# Patient Record
Sex: Female | Born: 1980 | Race: White | Hispanic: No | Marital: Single | Smoking: Former smoker
Health system: Southern US, Community
[De-identification: ages and names within clinical notes are randomized; demographics above are authoritative.]

## PROBLEM LIST (undated history)

## (undated) ENCOUNTER — Inpatient Hospital Stay (HOSPITAL_COMMUNITY): Payer: Self-pay

## (undated) DIAGNOSIS — IMO0002 Reserved for concepts with insufficient information to code with codable children: Secondary | ICD-10-CM

## (undated) DIAGNOSIS — G4700 Insomnia, unspecified: Secondary | ICD-10-CM

## (undated) DIAGNOSIS — C801 Malignant (primary) neoplasm, unspecified: Secondary | ICD-10-CM

## (undated) DIAGNOSIS — D649 Anemia, unspecified: Secondary | ICD-10-CM

## (undated) DIAGNOSIS — N12 Tubulo-interstitial nephritis, not specified as acute or chronic: Secondary | ICD-10-CM

## (undated) DIAGNOSIS — N3010 Interstitial cystitis (chronic) without hematuria: Secondary | ICD-10-CM

## (undated) DIAGNOSIS — F419 Anxiety disorder, unspecified: Secondary | ICD-10-CM

## (undated) DIAGNOSIS — N200 Calculus of kidney: Secondary | ICD-10-CM

## (undated) DIAGNOSIS — Z9289 Personal history of other medical treatment: Secondary | ICD-10-CM

## (undated) DIAGNOSIS — N390 Urinary tract infection, site not specified: Secondary | ICD-10-CM

## (undated) HISTORY — PX: ABDOMINAL HYSTERECTOMY: SHX81

## (undated) HISTORY — DX: Interstitial cystitis (chronic) without hematuria: N30.10

## (undated) HISTORY — DX: Insomnia, unspecified: G47.00

## (undated) HISTORY — DX: Reserved for concepts with insufficient information to code with codable children: IMO0002

## (undated) HISTORY — PX: BREAST SURGERY: SHX581

## (undated) HISTORY — PX: DILATION AND CURETTAGE OF UTERUS: SHX78

## (undated) HISTORY — PX: WISDOM TOOTH EXTRACTION: SHX21

## (undated) HISTORY — DX: Malignant (primary) neoplasm, unspecified: C80.1

---

## 2002-05-22 DIAGNOSIS — IMO0002 Reserved for concepts with insufficient information to code with codable children: Secondary | ICD-10-CM

## 2002-05-22 DIAGNOSIS — R87619 Unspecified abnormal cytological findings in specimens from cervix uteri: Secondary | ICD-10-CM

## 2002-05-22 DIAGNOSIS — C801 Malignant (primary) neoplasm, unspecified: Secondary | ICD-10-CM

## 2002-05-22 HISTORY — PX: LEEP: SHX91

## 2002-05-22 HISTORY — PX: OTHER SURGICAL HISTORY: SHX169

## 2002-05-22 HISTORY — DX: Malignant (primary) neoplasm, unspecified: C80.1

## 2002-05-22 HISTORY — DX: Unspecified abnormal cytological findings in specimens from cervix uteri: R87.619

## 2002-05-22 HISTORY — DX: Reserved for concepts with insufficient information to code with codable children: IMO0002

## 2004-06-20 ENCOUNTER — Emergency Department: Payer: Self-pay | Admitting: Emergency Medicine

## 2004-06-21 ENCOUNTER — Emergency Department: Payer: Self-pay | Admitting: Emergency Medicine

## 2004-09-27 ENCOUNTER — Emergency Department: Payer: Self-pay | Admitting: Emergency Medicine

## 2004-09-29 ENCOUNTER — Ambulatory Visit: Payer: Self-pay | Admitting: Emergency Medicine

## 2005-02-09 ENCOUNTER — Inpatient Hospital Stay: Payer: Self-pay | Admitting: Unknown Physician Specialty

## 2005-03-01 ENCOUNTER — Ambulatory Visit: Payer: Self-pay | Admitting: Oncology

## 2005-05-22 HISTORY — PX: CYSTOSCOPY: SUR368

## 2005-05-22 HISTORY — PX: HYSTEROSCOPY: SHX211

## 2005-05-31 ENCOUNTER — Ambulatory Visit: Payer: Self-pay | Admitting: Oncology

## 2005-06-09 ENCOUNTER — Ambulatory Visit: Payer: Self-pay | Admitting: Unknown Physician Specialty

## 2005-06-11 ENCOUNTER — Emergency Department: Payer: Self-pay | Admitting: Internal Medicine

## 2005-06-15 ENCOUNTER — Ambulatory Visit: Payer: Self-pay | Admitting: Unknown Physician Specialty

## 2005-12-18 ENCOUNTER — Emergency Department: Payer: Self-pay | Admitting: Emergency Medicine

## 2006-02-16 ENCOUNTER — Emergency Department: Payer: Self-pay | Admitting: Emergency Medicine

## 2006-03-06 ENCOUNTER — Emergency Department: Payer: Self-pay | Admitting: Emergency Medicine

## 2006-03-21 ENCOUNTER — Emergency Department: Payer: Self-pay | Admitting: Emergency Medicine

## 2006-03-22 ENCOUNTER — Ambulatory Visit: Payer: Self-pay | Admitting: Obstetrics and Gynecology

## 2006-04-09 ENCOUNTER — Emergency Department: Payer: Self-pay | Admitting: Emergency Medicine

## 2006-06-13 ENCOUNTER — Emergency Department: Payer: Self-pay | Admitting: Internal Medicine

## 2006-07-08 ENCOUNTER — Observation Stay: Payer: Self-pay | Admitting: Obstetrics and Gynecology

## 2006-07-17 ENCOUNTER — Observation Stay: Payer: Self-pay | Admitting: Obstetrics and Gynecology

## 2006-08-27 ENCOUNTER — Inpatient Hospital Stay: Payer: Self-pay | Admitting: Obstetrics and Gynecology

## 2006-09-05 ENCOUNTER — Observation Stay: Payer: Self-pay | Admitting: Obstetrics and Gynecology

## 2006-09-08 ENCOUNTER — Observation Stay: Payer: Self-pay | Admitting: Obstetrics and Gynecology

## 2006-09-08 ENCOUNTER — Other Ambulatory Visit: Payer: Self-pay

## 2006-09-10 ENCOUNTER — Observation Stay: Payer: Self-pay | Admitting: Obstetrics and Gynecology

## 2006-09-12 ENCOUNTER — Observation Stay: Payer: Self-pay | Admitting: Obstetrics and Gynecology

## 2006-09-17 ENCOUNTER — Observation Stay: Payer: Self-pay | Admitting: Obstetrics and Gynecology

## 2006-09-23 ENCOUNTER — Observation Stay: Payer: Self-pay | Admitting: Obstetrics and Gynecology

## 2006-09-24 ENCOUNTER — Inpatient Hospital Stay: Payer: Self-pay | Admitting: Obstetrics and Gynecology

## 2006-09-29 ENCOUNTER — Observation Stay: Payer: Self-pay | Admitting: Obstetrics and Gynecology

## 2006-10-02 ENCOUNTER — Observation Stay: Payer: Self-pay | Admitting: Obstetrics and Gynecology

## 2006-10-03 ENCOUNTER — Inpatient Hospital Stay: Payer: Self-pay | Admitting: Obstetrics and Gynecology

## 2008-03-14 ENCOUNTER — Emergency Department: Payer: Self-pay | Admitting: Emergency Medicine

## 2008-05-25 ENCOUNTER — Ambulatory Visit: Payer: Self-pay | Admitting: Obstetrics & Gynecology

## 2008-05-25 ENCOUNTER — Encounter: Payer: Self-pay | Admitting: Family Medicine

## 2008-05-25 LAB — CONVERTED CEMR LAB
Eosinophils Relative: 1 % (ref 0–5)
HCT: 37.4 % (ref 36.0–46.0)
Hemoglobin: 12.4 g/dL (ref 12.0–15.0)
Hepatitis B Surface Ag: NEGATIVE
Lymphocytes Relative: 26 % (ref 12–46)
Lymphs Abs: 1.7 10*3/uL (ref 0.7–4.0)
Monocytes Absolute: 0.4 10*3/uL (ref 0.1–1.0)
RBC: 4.48 M/uL (ref 3.87–5.11)
Rh Type: POSITIVE
WBC: 6.4 10*3/uL (ref 4.0–10.5)

## 2008-05-26 ENCOUNTER — Ambulatory Visit (HOSPITAL_COMMUNITY): Admission: RE | Admit: 2008-05-26 | Discharge: 2008-05-26 | Payer: Self-pay | Admitting: Obstetrics & Gynecology

## 2008-05-28 ENCOUNTER — Encounter: Payer: Self-pay | Admitting: Family Medicine

## 2008-05-28 ENCOUNTER — Ambulatory Visit: Payer: Self-pay | Admitting: Obstetrics & Gynecology

## 2008-05-28 ENCOUNTER — Other Ambulatory Visit: Admission: RE | Admit: 2008-05-28 | Discharge: 2008-05-28 | Payer: Self-pay | Admitting: Obstetrics & Gynecology

## 2008-05-28 ENCOUNTER — Encounter: Payer: Self-pay | Admitting: Obstetrics & Gynecology

## 2008-05-28 LAB — CONVERTED CEMR LAB

## 2008-06-09 ENCOUNTER — Encounter: Payer: Self-pay | Admitting: Family Medicine

## 2008-06-09 ENCOUNTER — Ambulatory Visit: Payer: Self-pay | Admitting: Obstetrics and Gynecology

## 2008-06-11 ENCOUNTER — Inpatient Hospital Stay (HOSPITAL_COMMUNITY): Admission: AD | Admit: 2008-06-11 | Discharge: 2008-06-12 | Payer: Self-pay | Admitting: Obstetrics & Gynecology

## 2008-06-24 ENCOUNTER — Ambulatory Visit: Payer: Self-pay | Admitting: Family Medicine

## 2008-07-03 ENCOUNTER — Ambulatory Visit: Payer: Self-pay | Admitting: Obstetrics & Gynecology

## 2008-07-04 ENCOUNTER — Encounter: Payer: Self-pay | Admitting: Family Medicine

## 2008-07-06 ENCOUNTER — Ambulatory Visit: Payer: Self-pay | Admitting: Obstetrics & Gynecology

## 2008-07-23 ENCOUNTER — Ambulatory Visit: Payer: Self-pay | Admitting: Obstetrics and Gynecology

## 2008-07-27 ENCOUNTER — Ambulatory Visit: Payer: Self-pay | Admitting: Obstetrics & Gynecology

## 2008-08-03 ENCOUNTER — Ambulatory Visit: Payer: Self-pay | Admitting: Obstetrics & Gynecology

## 2008-08-10 ENCOUNTER — Ambulatory Visit: Payer: Self-pay | Admitting: Obstetrics and Gynecology

## 2008-08-10 ENCOUNTER — Ambulatory Visit (HOSPITAL_COMMUNITY): Admission: RE | Admit: 2008-08-10 | Discharge: 2008-08-10 | Payer: Self-pay | Admitting: Obstetrics and Gynecology

## 2008-08-11 ENCOUNTER — Encounter: Payer: Self-pay | Admitting: Family Medicine

## 2008-08-17 ENCOUNTER — Ambulatory Visit: Payer: Self-pay | Admitting: Obstetrics and Gynecology

## 2008-08-24 ENCOUNTER — Ambulatory Visit: Payer: Self-pay | Admitting: Obstetrics & Gynecology

## 2008-08-25 ENCOUNTER — Inpatient Hospital Stay (HOSPITAL_COMMUNITY): Admission: AD | Admit: 2008-08-25 | Discharge: 2008-08-25 | Payer: Self-pay | Admitting: Obstetrics & Gynecology

## 2008-08-31 ENCOUNTER — Ambulatory Visit: Payer: Self-pay | Admitting: Family Medicine

## 2008-08-31 LAB — CONVERTED CEMR LAB: Streptococcus, Group A Screen (Direct): NEGATIVE

## 2008-09-07 ENCOUNTER — Ambulatory Visit: Payer: Self-pay | Admitting: Obstetrics & Gynecology

## 2008-09-14 ENCOUNTER — Ambulatory Visit: Payer: Self-pay | Admitting: Obstetrics & Gynecology

## 2008-09-21 ENCOUNTER — Ambulatory Visit: Payer: Self-pay | Admitting: Family Medicine

## 2008-09-28 ENCOUNTER — Ambulatory Visit: Payer: Self-pay | Admitting: Obstetrics & Gynecology

## 2008-10-05 ENCOUNTER — Encounter: Payer: Self-pay | Admitting: Family Medicine

## 2008-10-05 ENCOUNTER — Ambulatory Visit: Payer: Self-pay | Admitting: Obstetrics & Gynecology

## 2008-10-05 LAB — CONVERTED CEMR LAB
Hemoglobin: 9.8 g/dL — ABNORMAL LOW (ref 12.0–15.0)
INR: 1 (ref 0.0–1.5)
MCHC: 33 g/dL (ref 30.0–36.0)
RBC: 3.56 M/uL — ABNORMAL LOW (ref 3.87–5.11)

## 2008-10-08 ENCOUNTER — Ambulatory Visit: Payer: Self-pay | Admitting: Obstetrics & Gynecology

## 2008-10-08 ENCOUNTER — Encounter: Payer: Self-pay | Admitting: Family Medicine

## 2008-10-08 LAB — CONVERTED CEMR LAB: Vitamin B-12: 175 pg/mL — ABNORMAL LOW (ref 211–911)

## 2008-10-09 ENCOUNTER — Encounter: Payer: Self-pay | Admitting: Family Medicine

## 2008-10-15 ENCOUNTER — Ambulatory Visit: Payer: Self-pay | Admitting: Obstetrics & Gynecology

## 2008-10-16 ENCOUNTER — Ambulatory Visit: Payer: Self-pay | Admitting: Internal Medicine

## 2008-10-16 ENCOUNTER — Encounter: Payer: Self-pay | Admitting: Cardiovascular Disease

## 2008-10-16 DIAGNOSIS — R55 Syncope and collapse: Secondary | ICD-10-CM | POA: Insufficient documentation

## 2008-10-16 DIAGNOSIS — R002 Palpitations: Secondary | ICD-10-CM

## 2008-10-17 ENCOUNTER — Ambulatory Visit: Payer: Self-pay | Admitting: Advanced Practice Midwife

## 2008-10-17 ENCOUNTER — Inpatient Hospital Stay (HOSPITAL_COMMUNITY): Admission: AD | Admit: 2008-10-17 | Discharge: 2008-10-17 | Payer: Self-pay | Admitting: Obstetrics & Gynecology

## 2008-10-18 ENCOUNTER — Inpatient Hospital Stay (HOSPITAL_COMMUNITY): Admission: AD | Admit: 2008-10-18 | Discharge: 2008-10-18 | Payer: Self-pay | Admitting: Obstetrics & Gynecology

## 2008-10-20 ENCOUNTER — Ambulatory Visit: Payer: Self-pay | Admitting: Obstetrics & Gynecology

## 2008-10-21 ENCOUNTER — Ambulatory Visit (HOSPITAL_COMMUNITY): Admission: RE | Admit: 2008-10-21 | Discharge: 2008-10-21 | Payer: Self-pay | Admitting: Obstetrics & Gynecology

## 2008-10-26 ENCOUNTER — Ambulatory Visit: Payer: Self-pay | Admitting: Obstetrics & Gynecology

## 2008-10-27 ENCOUNTER — Encounter: Payer: Self-pay | Admitting: Family Medicine

## 2008-10-27 LAB — CONVERTED CEMR LAB

## 2008-10-30 ENCOUNTER — Ambulatory Visit (HOSPITAL_COMMUNITY): Admission: RE | Admit: 2008-10-30 | Discharge: 2008-10-30 | Payer: Self-pay | Admitting: Obstetrics & Gynecology

## 2008-11-02 ENCOUNTER — Ambulatory Visit: Payer: Self-pay | Admitting: Obstetrics & Gynecology

## 2008-11-04 ENCOUNTER — Ambulatory Visit (HOSPITAL_COMMUNITY): Admission: RE | Admit: 2008-11-04 | Discharge: 2008-11-04 | Payer: Self-pay | Admitting: Obstetrics & Gynecology

## 2008-11-05 ENCOUNTER — Ambulatory Visit: Payer: Self-pay | Admitting: Obstetrics and Gynecology

## 2008-11-05 ENCOUNTER — Inpatient Hospital Stay (HOSPITAL_COMMUNITY): Admission: AD | Admit: 2008-11-05 | Discharge: 2008-11-05 | Payer: Self-pay | Admitting: Obstetrics & Gynecology

## 2008-11-09 ENCOUNTER — Ambulatory Visit: Payer: Self-pay | Admitting: Obstetrics & Gynecology

## 2008-11-12 ENCOUNTER — Ambulatory Visit: Payer: Self-pay | Admitting: Obstetrics & Gynecology

## 2008-11-16 ENCOUNTER — Ambulatory Visit: Payer: Self-pay | Admitting: Obstetrics & Gynecology

## 2008-11-18 ENCOUNTER — Ambulatory Visit: Payer: Self-pay | Admitting: Obstetrics and Gynecology

## 2008-11-18 ENCOUNTER — Observation Stay (HOSPITAL_COMMUNITY): Admission: AD | Admit: 2008-11-18 | Discharge: 2008-11-19 | Payer: Self-pay | Admitting: Obstetrics and Gynecology

## 2008-11-24 ENCOUNTER — Ambulatory Visit: Payer: Self-pay | Admitting: Obstetrics & Gynecology

## 2008-11-26 ENCOUNTER — Ambulatory Visit: Payer: Self-pay | Admitting: Obstetrics & Gynecology

## 2008-11-29 ENCOUNTER — Inpatient Hospital Stay (HOSPITAL_COMMUNITY): Admission: AD | Admit: 2008-11-29 | Discharge: 2008-11-29 | Payer: Self-pay | Admitting: Obstetrics & Gynecology

## 2008-11-30 ENCOUNTER — Ambulatory Visit: Payer: Self-pay | Admitting: Obstetrics & Gynecology

## 2008-12-03 ENCOUNTER — Ambulatory Visit: Payer: Self-pay | Admitting: Obstetrics and Gynecology

## 2008-12-07 ENCOUNTER — Ambulatory Visit: Payer: Self-pay | Admitting: Obstetrics & Gynecology

## 2008-12-08 ENCOUNTER — Ambulatory Visit: Payer: Self-pay | Admitting: Obstetrics and Gynecology

## 2008-12-08 ENCOUNTER — Inpatient Hospital Stay (HOSPITAL_COMMUNITY): Admission: AD | Admit: 2008-12-08 | Discharge: 2008-12-08 | Payer: Self-pay | Admitting: Obstetrics & Gynecology

## 2008-12-10 ENCOUNTER — Ambulatory Visit (HOSPITAL_COMMUNITY): Admission: RE | Admit: 2008-12-10 | Discharge: 2008-12-10 | Payer: Self-pay | Admitting: Obstetrics & Gynecology

## 2008-12-10 ENCOUNTER — Ambulatory Visit: Payer: Self-pay | Admitting: Obstetrics and Gynecology

## 2008-12-14 ENCOUNTER — Ambulatory Visit: Payer: Self-pay | Admitting: Obstetrics & Gynecology

## 2008-12-17 ENCOUNTER — Ambulatory Visit: Payer: Self-pay | Admitting: Obstetrics & Gynecology

## 2008-12-17 LAB — CONVERTED CEMR LAB: Chlamydia, DNA Probe: NEGATIVE

## 2008-12-18 ENCOUNTER — Encounter: Payer: Self-pay | Admitting: Obstetrics & Gynecology

## 2008-12-18 LAB — CONVERTED CEMR LAB
Clue Cells Wet Prep HPF POC: NONE SEEN
Trich, Wet Prep: NONE SEEN
Yeast Wet Prep HPF POC: NONE SEEN

## 2008-12-21 ENCOUNTER — Ambulatory Visit: Payer: Self-pay | Admitting: Obstetrics & Gynecology

## 2008-12-22 ENCOUNTER — Ambulatory Visit: Payer: Self-pay | Admitting: Obstetrics & Gynecology

## 2008-12-23 ENCOUNTER — Encounter: Payer: Self-pay | Admitting: Obstetrics & Gynecology

## 2008-12-23 LAB — CONVERTED CEMR LAB: Trich, Wet Prep: NONE SEEN

## 2008-12-24 ENCOUNTER — Ambulatory Visit: Payer: Self-pay | Admitting: Obstetrics & Gynecology

## 2008-12-24 ENCOUNTER — Inpatient Hospital Stay (HOSPITAL_COMMUNITY): Admission: AD | Admit: 2008-12-24 | Discharge: 2008-12-27 | Payer: Self-pay | Admitting: Obstetrics & Gynecology

## 2009-02-10 ENCOUNTER — Ambulatory Visit: Payer: Self-pay | Admitting: Obstetrics & Gynecology

## 2009-02-19 ENCOUNTER — Ambulatory Visit: Payer: Self-pay | Admitting: Family Medicine

## 2009-08-16 ENCOUNTER — Ambulatory Visit: Payer: Self-pay | Admitting: Obstetrics and Gynecology

## 2009-08-16 ENCOUNTER — Other Ambulatory Visit: Admission: RE | Admit: 2009-08-16 | Discharge: 2009-08-16 | Payer: Self-pay | Admitting: Obstetrics and Gynecology

## 2009-08-16 LAB — CONVERTED CEMR LAB: Helicobacter Pylori Antibody-IgG: 1.5 — ABNORMAL HIGH

## 2009-08-17 ENCOUNTER — Encounter: Payer: Self-pay | Admitting: Obstetrics and Gynecology

## 2009-08-17 LAB — CONVERTED CEMR LAB
Clue Cells Wet Prep HPF POC: NONE SEEN
Trich, Wet Prep: NONE SEEN

## 2009-09-06 ENCOUNTER — Ambulatory Visit: Payer: Self-pay | Admitting: Obstetrics and Gynecology

## 2009-09-06 LAB — CONVERTED CEMR LAB: Chlamydia, DNA Probe: NEGATIVE

## 2009-10-18 IMAGING — US US OB FOLLOW-UP
1 series · 18 of 28 positions shown · non-contrast
Comparison: none

OBSTETRICAL ULTRASOUND:
 This ultrasound was performed in The [HOSPITAL], and the [facility] report will be stored to [REDACTED] PACS.

[Series 1: us ob follow-up · 18 of 46 slices shown]
[im 1/46]
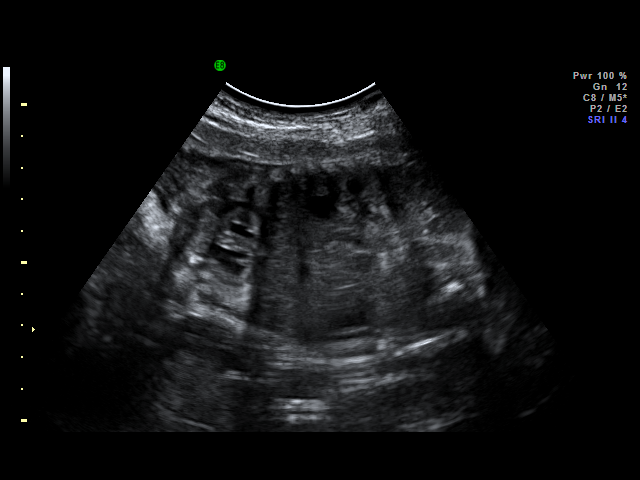
[im 4/46]
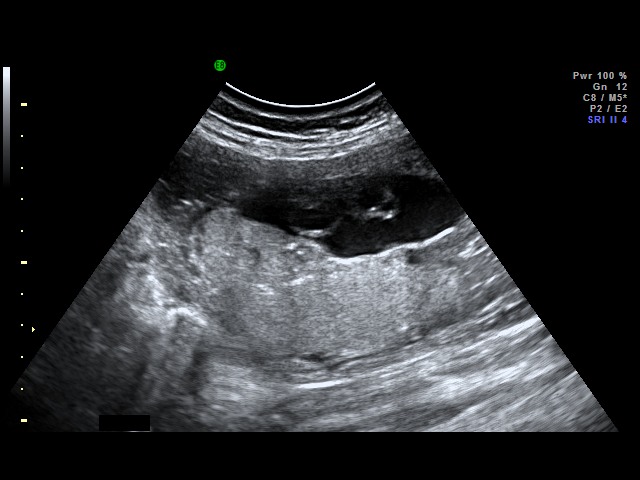
[im 6/46]
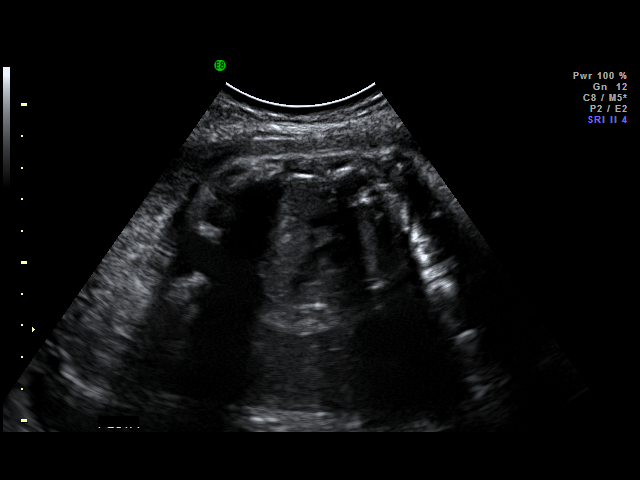
[im 9/46]
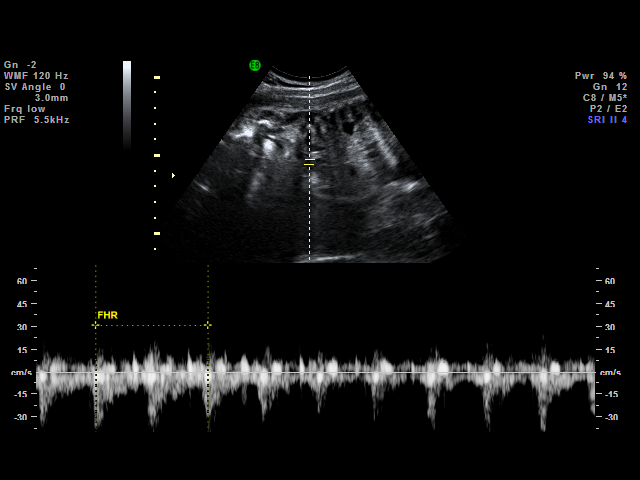
[im 12/46]
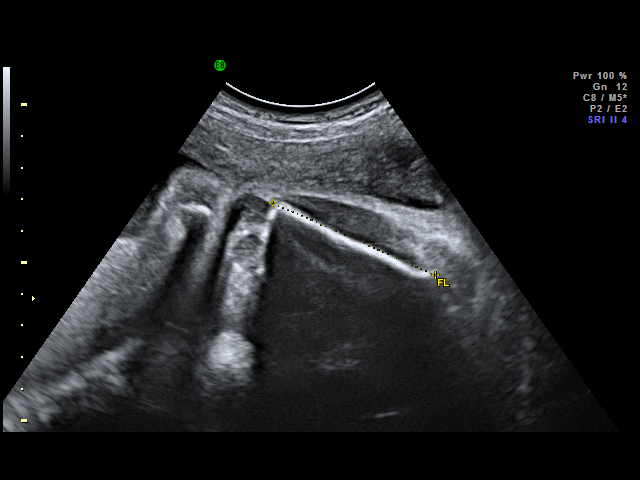
[im 14/46]
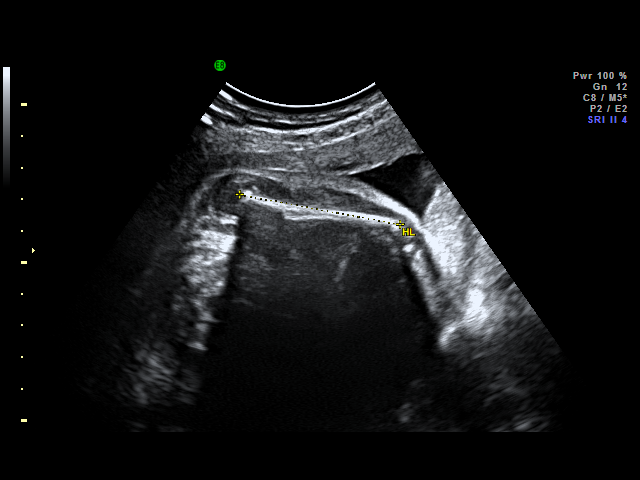
[im 17/46]
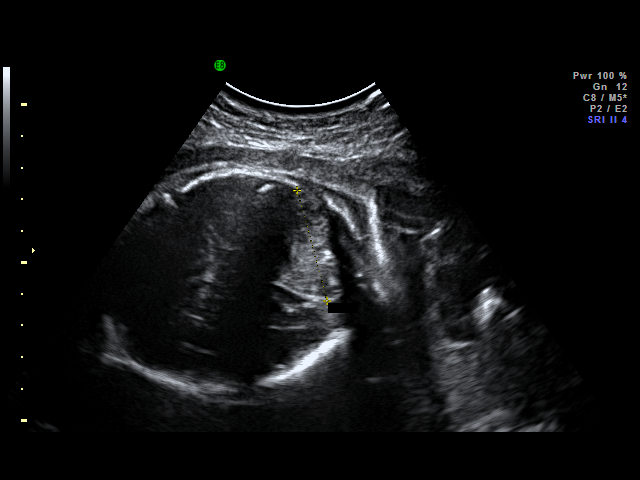
[im 19/46]
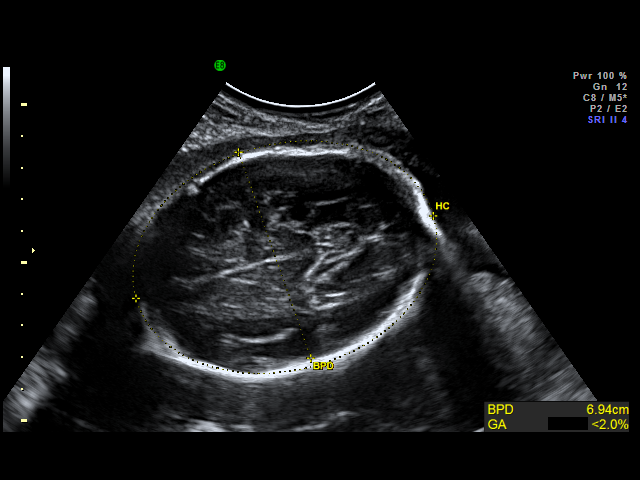
[im 22/46]
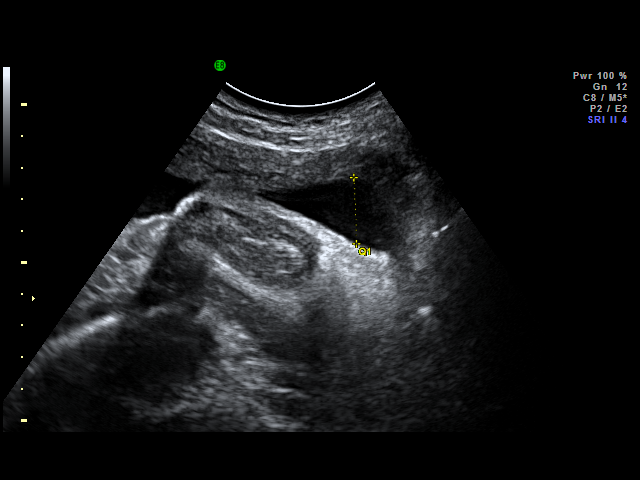
[im 24/46]
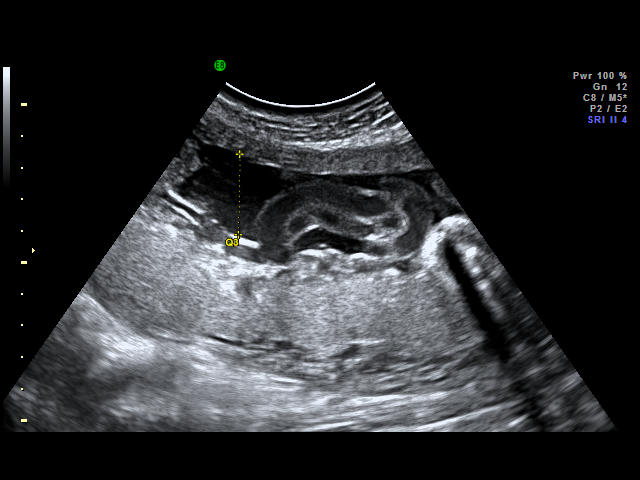
[im 27/46]
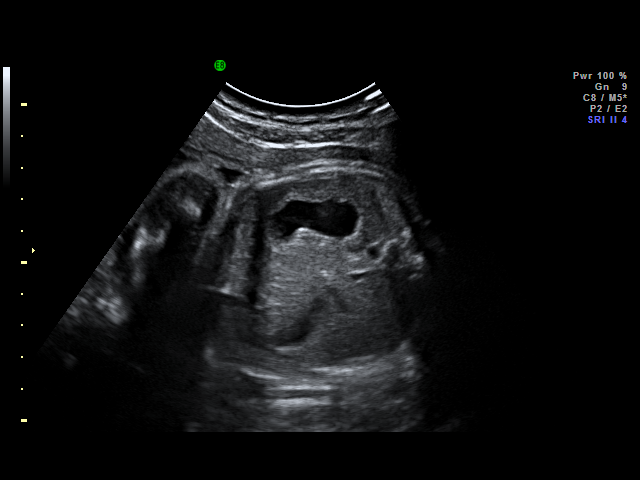
[im 29/46]
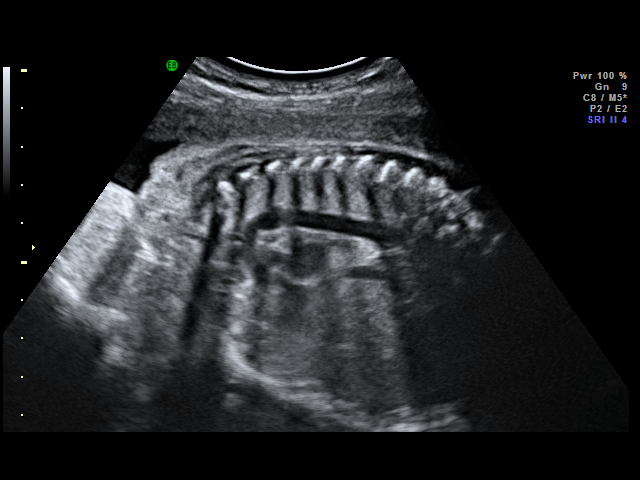
[im 32/46]
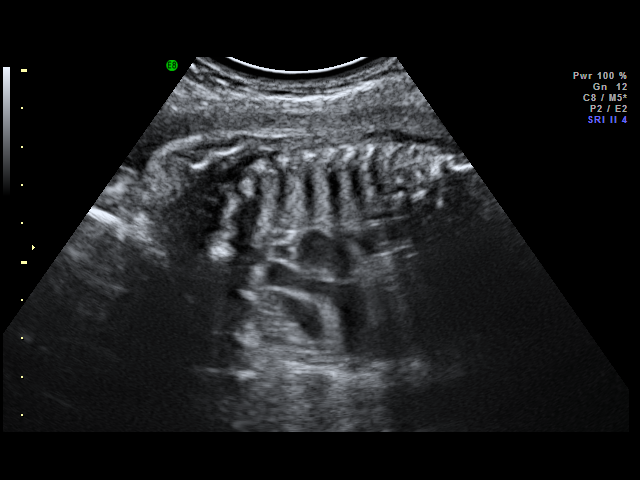
[im 36/46]
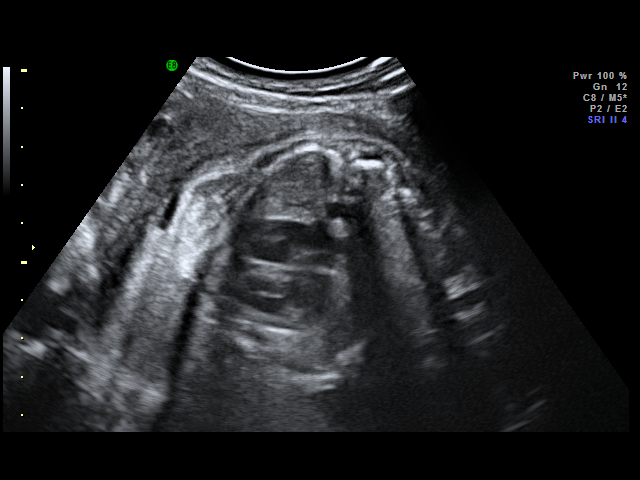
[im 37/46]
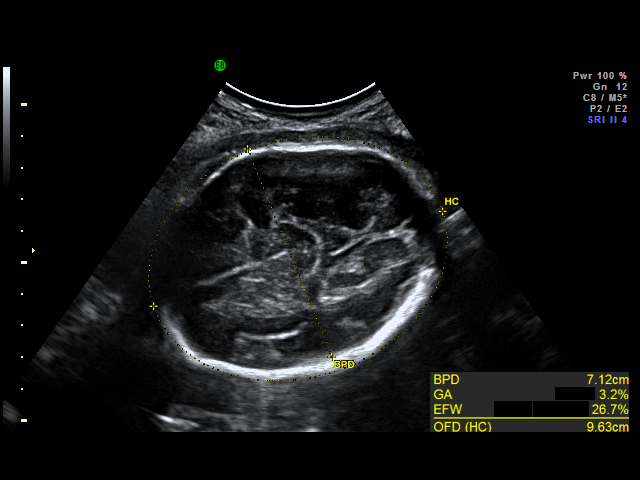
[im 41/46]
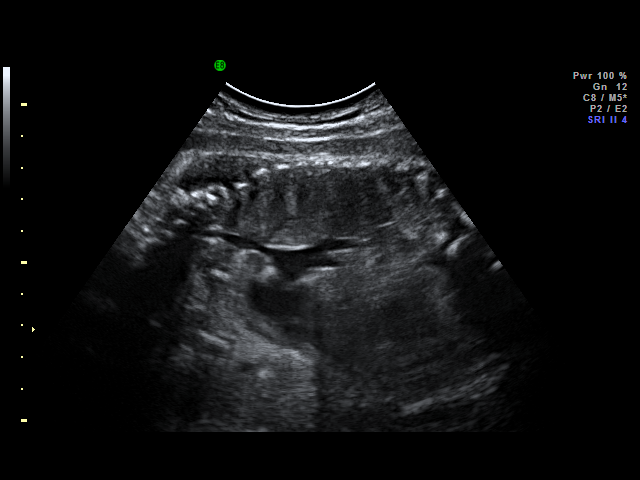
[im 42/46]
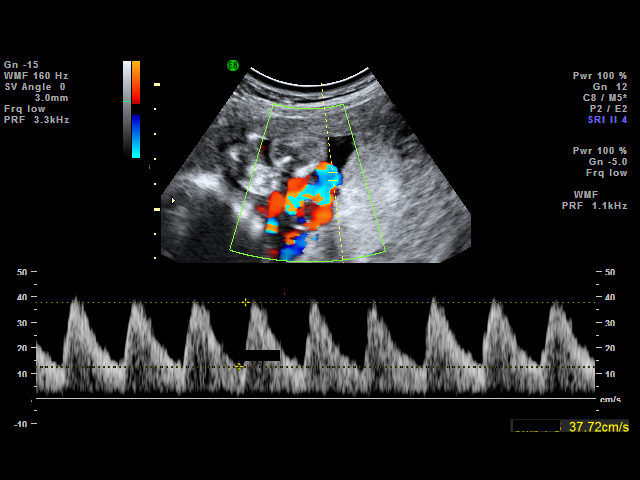
[im 46/46]
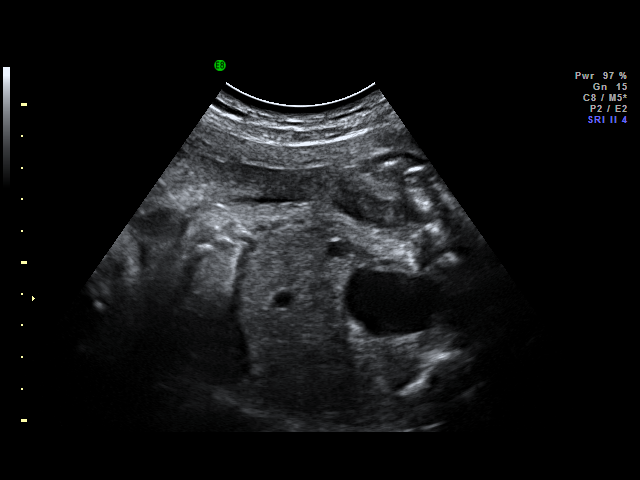

[18 of 28 positions shown; findings below may reference images not displayed]

IMPRESSION: [facility] has also been faxed to the ordering physician.

## 2009-12-30 ENCOUNTER — Emergency Department: Payer: Self-pay | Admitting: Emergency Medicine

## 2009-12-31 ENCOUNTER — Encounter: Payer: Self-pay | Admitting: Obstetrics and Gynecology

## 2010-01-11 ENCOUNTER — Ambulatory Visit: Payer: Self-pay | Admitting: Nurse Practitioner

## 2010-01-18 ENCOUNTER — Ambulatory Visit: Payer: Self-pay | Admitting: Nurse Practitioner

## 2010-03-01 ENCOUNTER — Ambulatory Visit: Payer: Self-pay | Admitting: Nurse Practitioner

## 2010-03-29 ENCOUNTER — Ambulatory Visit: Payer: Self-pay | Admitting: Nurse Practitioner

## 2010-06-01 ENCOUNTER — Ambulatory Visit
Admission: RE | Admit: 2010-06-01 | Discharge: 2010-06-01 | Payer: Self-pay | Source: Home / Self Care | Attending: Obstetrics & Gynecology | Admitting: Obstetrics & Gynecology

## 2010-06-01 ENCOUNTER — Encounter: Payer: Self-pay | Admitting: Obstetrics & Gynecology

## 2010-06-01 LAB — CONVERTED CEMR LAB
Chlamydia, DNA Probe: NEGATIVE
GC Probe Amp, Genital: NEGATIVE
HCV Ab: NEGATIVE
Hepatitis B Surface Ag: NEGATIVE

## 2010-06-02 ENCOUNTER — Encounter: Payer: Self-pay | Admitting: Obstetrics & Gynecology

## 2010-06-02 LAB — CONVERTED CEMR LAB

## 2010-06-13 ENCOUNTER — Encounter: Payer: Self-pay | Admitting: Obstetrics and Gynecology

## 2010-06-21 ENCOUNTER — Ambulatory Visit
Admission: RE | Admit: 2010-06-21 | Discharge: 2010-06-21 | Payer: Self-pay | Source: Home / Self Care | Attending: Nurse Practitioner | Admitting: Nurse Practitioner

## 2010-06-22 NOTE — Assessment & Plan Note (Unsigned)
NAME:  Natalie Duffy, Natalie Duffy            ACCOUNT NO.:  [id number]  MEDICAL RECORD NO.:  [id number]          PATIENT TYPE:  POB  LOCATION:  [facility] at [facility]         FACILITY:  [facility]  PHYSICIAN:  Allie Bossier, MD        DATE OF BIRTH:  Jun 14, 1980  DATE OF SERVICE:  06/21/2010                                 CLINIC NOTE  The patient comes office today for followup on migraine headaches.  The patient has not been doing very well lately.  She discovered that her husband had been cheating on her.  She actually came to the office on June 01, 2010.  She was seen by Dr. Macon Large who did an STD screening. Please see that note.  Her STD screening tests were normal.  Her husband has left the home.  The patient did call the office and was called in a prescription for prednisone taper, but apparently that was not communicated to this patient and she had not picked that up as yet.  She will be going to take it up today.  She continues to be on doxepin 75 mg, but not helping her sleep at night due to the anxiety.  She has taken Ambien in the past and she is asking to have that refilled today. The patient was given some Ativan by Dr. Macon Large, which has not really been very helpful.  The patient just feels at this point that she has to work through her situation.  She is currently anticipating some plastic surgery for breast augmentation.  ASSESSMENT:  Migraine.  PLAN: 1. Ambien 10 mg one p.o. q.h.s. #30, with two refills. 2. Return to the clinic on a p.r.n. basis.     Remonia Richter, NP   ______________________________ Allie Bossier, MD   LR/MEDQ  D:  06/21/2010  T:  06/22/2010  Job:  [id number]

## 2010-08-02 ENCOUNTER — Encounter (INDEPENDENT_AMBULATORY_CARE_PROVIDER_SITE_OTHER): Payer: Self-pay | Admitting: Nurse Practitioner

## 2010-08-02 DIAGNOSIS — G43109 Migraine with aura, not intractable, without status migrainosus: Secondary | ICD-10-CM

## 2010-08-12 NOTE — Assessment & Plan Note (Unsigned)
NAME:  Natalie Duffy, Natalie Duffy            ACCOUNT NO.:  [id number]  MEDICAL RECORD NO.:  [id number]           PATIENT TYPE:  LOCATION:  [facility] at [facility]           FACILITY:  PHYSICIAN:  Allie Bossier, MD        DATE OF BIRTH:  09/30/1980  DATE OF SERVICE:  08/02/2010                                 CLINIC NOTE  The patient comes to office today for followup on her migraine headaches.  The patient is having significant headache for the last 3-4 weeks.  She feels this is following taking 30 tablets of Vicodin for her breast augmentation surgery.  Since then she has had an almost daily headache.  She has had three rounds of prednisone for headaches such as this, and we discussed not taking it again just soon.  She is also complaining of symptoms of an upper respiratory infection that has been ongoing for approximately 3 weeks.  She has green nasal discharge and cough.  No fever.  PHYSICAL EXAMINATION:  GENERAL:  A well developed, well nourished 30- year-old Caucasian female in no acute distress. HEENT:  Head is normocephalic, atraumatic.  Pupils are equal and reactive.  Nasal passages are congested. NEUROLOGICALLY:  Alert, oriented, well coordinated.  Good muscle tone and good sensation.  ASSESSMENT: 1. Upper respiratory illness by history. 2. Migraine.  PLAN:  The patient will be given Cipro 500 mg b.i.d. for 7 days.  She has multiple allergies to antibiotics.  She is advised to increase her fluids and rest.  For her headache, she will be given an Amerge 2.5 mg taper.  She will take one b.i.d. until gone.  We will also go up on her doxepin 200 mg, hopefully that will help and she is given Phenergan for nausea.  She will return on an as-needed basis.     Remonia Richter, NP   ______________________________ Allie Bossier, MD   LR/MEDQ  D:  08/02/2010  T:  08/03/2010  Job:  [id number]

## 2010-08-27 LAB — CBC
Hemoglobin: 9.4 g/dL — ABNORMAL LOW (ref 12.0–15.0)
MCHC: 34.3 g/dL (ref 30.0–36.0)
RBC: 3.48 MIL/uL — ABNORMAL LOW (ref 3.87–5.11)
WBC: 8 10*3/uL (ref 4.0–10.5)

## 2010-08-27 LAB — RPR: RPR Ser Ql: NONREACTIVE

## 2010-08-28 LAB — WET PREP, GENITAL: Yeast Wet Prep HPF POC: NONE SEEN

## 2010-08-28 LAB — CULTURE, BETA STREP (GROUP B ONLY)

## 2010-08-29 LAB — STREP B DNA PROBE: Strep Group B Ag: POSITIVE

## 2010-08-29 LAB — GC/CHLAMYDIA PROBE AMP, GENITAL
Chlamydia, DNA Probe: NEGATIVE
GC Probe Amp, Genital: NEGATIVE

## 2010-08-29 LAB — URINALYSIS, ROUTINE W REFLEX MICROSCOPIC
Bilirubin Urine: NEGATIVE
Ketones, ur: NEGATIVE mg/dL
Nitrite: NEGATIVE
Protein, ur: NEGATIVE mg/dL
pH: 6 (ref 5.0–8.0)

## 2010-08-29 LAB — WET PREP, GENITAL
Clue Cells Wet Prep HPF POC: NONE SEEN
Yeast Wet Prep HPF POC: NONE SEEN

## 2010-08-30 LAB — URINALYSIS, ROUTINE W REFLEX MICROSCOPIC
Bilirubin Urine: NEGATIVE
Hgb urine dipstick: NEGATIVE
Specific Gravity, Urine: 1.015 (ref 1.005–1.030)
pH: 7 (ref 5.0–8.0)

## 2010-08-30 LAB — WET PREP, GENITAL
Trich, Wet Prep: NONE SEEN
Yeast Wet Prep HPF POC: NONE SEEN

## 2010-08-30 LAB — URINE CULTURE
Colony Count: NO GROWTH
Culture: NO GROWTH

## 2010-08-30 LAB — URINE MICROSCOPIC-ADD ON: RBC / HPF: NONE SEEN RBC/hpf (ref <3)

## 2010-08-31 LAB — WET PREP, GENITAL: Trich, Wet Prep: NONE SEEN

## 2010-08-31 LAB — URINALYSIS, ROUTINE W REFLEX MICROSCOPIC
Ketones, ur: NEGATIVE mg/dL
Nitrite: NEGATIVE
Protein, ur: NEGATIVE mg/dL
Urobilinogen, UA: 0.2 mg/dL (ref 0.0–1.0)

## 2010-08-31 LAB — URINE MICROSCOPIC-ADD ON

## 2010-09-05 LAB — URINALYSIS, ROUTINE W REFLEX MICROSCOPIC
Bilirubin Urine: NEGATIVE
Glucose, UA: NEGATIVE mg/dL
Hgb urine dipstick: NEGATIVE
Specific Gravity, Urine: 1.025 (ref 1.005–1.030)
pH: 6.5 (ref 5.0–8.0)

## 2010-09-05 LAB — COMPREHENSIVE METABOLIC PANEL
AST: 16 U/L (ref 0–37)
Albumin: 3.6 g/dL (ref 3.5–5.2)
Calcium: 8.6 mg/dL (ref 8.4–10.5)
Chloride: 103 mEq/L (ref 96–112)
Creatinine, Ser: 0.37 mg/dL — ABNORMAL LOW (ref 0.4–1.2)
GFR calc Af Amer: >60 mL/min (ref >60)
Total Bilirubin: 0.6 mg/dL (ref 0.3–1.2)

## 2010-09-05 LAB — CBC
MCV: 85.4 fL (ref 78.0–100.0)
Platelets: 146 10*3/uL — ABNORMAL LOW (ref 150–400)
WBC: 7.5 10*3/uL (ref 4.0–10.5)

## 2010-10-03 ENCOUNTER — Ambulatory Visit (INDEPENDENT_AMBULATORY_CARE_PROVIDER_SITE_OTHER): Payer: Self-pay | Admitting: Obstetrics and Gynecology

## 2010-10-03 DIAGNOSIS — G252 Other specified forms of tremor: Secondary | ICD-10-CM

## 2010-10-04 NOTE — Assessment & Plan Note (Signed)
NAME:  Natalie Duffy, Natalie Duffy            ACCOUNT NO.:  [id number]   MEDICAL RECORD NO.:  [id number]          PATIENT TYPE:  POB   LOCATION:  [facility] at [facility]         FACILITY:  [facility]   PHYSICIAN:  Allie Bossier, MD        DATE OF BIRTH:  1980/10/06   DATE OF SERVICE:  03/01/2010                                  CLINIC NOTE   The patient comes to the office today for followup on her migraine  headaches.  The patient was last seen on August 30, since then she has  gone up on her doxepin, tolerated it well, still only sleeps 3 or 4  hours per night and feels that she could easily go up again.  She has  gained 10 pounds since her last office visit.  She is still underweight  and is not unhappy about this weight gain.  She has been doing well up  until 2 weeks ago.  She was at the dentist and had a lot of dental work  done and to be done and that told to set her off again.  In the last 1  month, she has had approximately 4 severe headaches and 6-7 mild and  moderate period.  She has had a total of 10-11 headaches in the last 1  month.  She has used Thorazine for rescue with mostly good results.  She  does not have health insurance and this makes treating her headaches  somewhat difficult.   PHYSICAL EXAMINATION:  GENERAL:  A well-developed, well-nourished 30-  year-old Caucasian female in no acute distress.  VITAL SIGNS:  Blood pressure is 138/61, pulse is 85, weight is 110,  height is 5 feet 3 inches.  HEENT:  Head normocephalic and atraumatic.  Pupils are equal and  reactive.   ASSESSMENT:  Migraine, insomnia.   PLAN:  We will go up on her doxepin to 75 mg 1 p.o. nightly #30 with  three refills.  She will also add Motrin 800 mg 1 p.o. t.i.d. p.r.n.  headache #60 with 1 refill to the Thorazine as needed for rescue.  She  will return to the office in 1 month or sooner as needed.      Remonia Richter, NP    ______________________________  Allie Bossier, MD    LR/MEDQ  D:   03/01/2010  T:  03/02/2010  Job:  [id number]

## 2010-10-04 NOTE — Assessment & Plan Note (Signed)
NAME:  Natalie Duffy, Natalie Duffy            ACCOUNT NO.:  [id number]   MEDICAL RECORD NO.:  [id number]          PATIENT TYPE:  POB   LOCATION:  [facility] at [facility]         FACILITY:  [facility]   PHYSICIAN:  Scheryl Darter, MD       DATE OF BIRTH:  05-03-1981   DATE OF SERVICE:  02/10/2009                                  CLINIC NOTE   The patient is here for her postpartum visit for vaginal delivery August  6.  She had an induction of labor at about 37 and half weeks gestation  due to a intrauterine growth retardation and oligohydramnios.  The baby  weighed 5 pounds 1 ounce and was a female and is doing well.  The patient  has abstained from intercourse since delivery.  She is interested in  intrauterine device and we discussed the Mirena and ParaGard and also  Implanon.  Gave her information on Mirena.   PHYSICAL EXAMINATION:  GENERAL:  The patient has normal affect.  VITAL SIGNS:  Weight 102 pounds, BP 125/67, pulse 82.  ABDOMEN:  Flat, soft, and nontender.  EXTERNAL GENITALIA:  Vagina and cervix showed some light menstrual  blood.  She says this has decreased considerably over the last few days.  Uterus normal in size, nontender, no mass.   IMPRESSION:  The patient is doing well postpartum.   PLAN:  She wished to schedule insertion of IUD.  She can return seeing.  I recommend that she continues to abstain from intercourse until she  initiates reliable contraceptive method.      Scheryl Darter, MD     JA/MEDQ  D:  02/10/2009  T:  02/11/2009  Job:  [id number]

## 2010-10-04 NOTE — Assessment & Plan Note (Signed)
NAME:  Natalie Duffy, Natalie Duffy            ACCOUNT NO.:  [id number]   MEDICAL RECORD NO.:  [id number]          PATIENT TYPE:  POB   LOCATION:  [facility] at [facility]         FACILITY:  [facility]   PHYSICIAN:  Remonia Richter, NP   DATE OF BIRTH:  23-Jan-1981   DATE OF SERVICE:  01/11/2010                                  CLINIC NOTE   The patient comes to office today for a headache consultation.  The  patient has been in this practice, is well known to this practice and  has delivered her children here.  She has been having migraine headaches  as far back as elementary school.  She did get a formal diagnosis in  high school.  Her headaches come from her mother, who has a history of  migraine.  She does not have aura with her headaches.  Her headaches are  located in her left and right temple, it usually right greater than left  in a 18-month period.  She has typically 15 severe, 15 moderates, and a  mild that last almost every day.  She does feel that she has a headache  every day.  In the past, she has used Excedrin Migraine, Bayer, and  Tylenol.  [facility] the [facility]  She  was given Imitrex which was of no help.  She had been on Topamax and  trigger point injections.  In the last 2 weeks, her headaches have  become very severe and in fact once she had to go to the emergency room  where she got a migraine cocktail.   She has a past medical history of depression, anxiety, insomnia.  She is  a self-described perfectionist.  She is married, though her husband has  recently been unfaithful and there has been a separation but they are  reconciled now, things are better in her words.  She works as a full-  [employer].  Her job as a physical job.  She feels that at this point  that the money is okay but there is not much leeway for things to go  wrong.  She does not have health insurance.  She has 3 boys, ages 18, 17,  and 1 and she is very busy in multitasking with  them as well.  For birth  control, she uses birth control pills.   ALLERGIES:  Multiple allergies to antibiotics.   LAST MENSTRUAL PERIOD:  7/11.   PHYSICAL EXAMINATION:  VITAL SIGNS:  Blood pressure is 124/75, pulse is  92, weight is 96, height is 5 feet 3 inches.  GENERAL:  Well-developed, well-nourished thin 30 year old Caucasian  female in no acute distress.  HEENT:  Head is normocephalic and atraumatic.  Pupils equal and react.  NEUROLOGIC:  The patient is alert, oriented, somewhat anxious.  She has  good muscle tone, muscle sensation and coordination.  LUNGS:  Clear bilaterally.  CARDIAC:  Regular rate and rhythm.  EXTREMITIES:  Warm and dry.   ASSESSMENT:  Migraine without aura, anxiety, depression, insomnia.   PLAN:  As this patient does not have health insurance and money is  somewhat an issue with, we will try to start her out  on doxepin 10 mg  one p.o. at bedtime, #30 with 3 refills.  We will use that for  prevention as well as anxiety, insomnia, and perhaps some depression.  She will be given prednisone 20 mg 2 tablets q.a.m. for 7 days to help  her break the headache cycle.  She will be given Thorazine 25 mg one  p.o. q.6 h. p.r.n. rescue for migraines only, #20 with no refills.  We  had a lengthy discussion today concerning behavioral issues.  The  patient is asked to stop all caffeine after 12 noon.  She is asked to  cut back on her cigarettes.  She is asked to stop working around the  house after 8 o'clock and try to settle herself down prior to going to  bed.  We did focus on triggers and she was given some education where  that is concerned.  She will trial her new medications and we will see  her back in 1 week.      Remonia Richter, NP     LR/MEDQ  D:  01/11/2010  T:  01/12/2010  Job:  [id number]

## 2010-10-04 NOTE — Assessment & Plan Note (Signed)
NAME:  Natalie Duffy, Natalie Duffy            ACCOUNT NO.:  [id number]   MEDICAL RECORD NO.:  [id number]          PATIENT TYPE:  POB   LOCATION:  [facility] at [facility]         FACILITY:  [facility]   PHYSICIAN:  Argentina Donovan, MD        DATE OF BIRTH:  1981/01/21   DATE OF SERVICE:  08/16/2009                                  CLINIC NOTE   The patient is a 30 year old Caucasian female, gravida 4, para 0-3-1-3,  who delivered her last baby 8 months ago at 37 weeks weighing just over  5 pounds.  Since the baby was born in February of this year, they  noticed a swelling from the soft spot in the babies head and when they  did a MRI, they found that there was a lot of fluid outside of the  brain, but it was not pushing on the brain at all.  She is being  followed by a neurologist now for developmental problems at [facility]  Since  delivery, especially starting in the late fall, she started noting an  increase in pain in the lower abdomen, it was not related to her  periods, i.e. she has been on Seasonique for birth control and it did  not seem to be related to diet.  She tried antacids which has not helped  and it lasted.  It was present most of the 24 hours in the day.  It was  a burning and crampy pain in the lower abdomen, which at times stops her  and attracts.  She has been driving and had to pull over the side of  road, I think she was going to pass out from the pain, but at other  times it is just more or less annoying.  It often wakes her up at night  and she is already having sleep disturbances.  She goes to work, gets up  at 5 o'clock in the morning to go to work and she tries to get in bed by  10 o'clock time daily at night.  She falls asleep often quite easily,  but then wakes up and her mind is going throughout the night.  She  denies signs of depression.  She only has a bowel movement every 2-3  days which she says is pretty common in her family and has never had  regular bowel movements, but she  does not suffer from constipation or  diarrhea.  I have encouraged her to increase her fluid intake and her  fiber intake.  I am going to put her on some Ambien to see if this will  help her with her sleep.  At this point, I think she may need a sleep  therapist and be evaluated in the future for that.  The way she is  describing the pain, it almost sounds as if it is related to uterine  contractions, although on examination, her abdomen is soft, flat,  nontender.  No masses, no organomegaly.  She is very very thin, so the  pelvic organs could be easily evaluated.  The external genitalia is  normal.  BUS within normal limits.  The vagina is clean and well  rugated.  The cervix  is clean, parous.  Pap smear is taken and a wet  prep.  The uterus is anterior with normal size, shape, consistency.  The  adnexa is normal and easily palpated without signs of any pelvic  pathology.  I am going to increase her Seasonique which she has been  getting samples and since she has noted periods and headaches have been  much better.  She has had less problems with menstrual migraine that she  has had in the past.  Relationship with her husband has improved  enormously since the baby and she does not seem discussing whether that  she is significantly depressed.  Insomnia with chronic pelvic pain,  crampy and burning in nature, it ebbs and flows, and it sometimes is  extremely severe.  I am not quite sure what the etiology of this or her  insomnia is, although she does state that both have gotten worse, but  since she found out about the baby in February, but she thinks it is  purely coincidental as she says she does not agonize her stress over the  baby.  She did cheat for a week or two in the beginning, but the baby  seems to be doing fine and her mother seems to be watching the baby most  of the time.  I am going to have her come back after she increases her  birth control pills to 2 of the [facility] a  day well for the next  several weeks, have her come back in 3 weeks and see if that has helped  the pain by increasing the hormone level also.  I am going to get an  Helicobacter pylori blood test and see whether or not that happens to be  normal.  I am somewhat confused by the patient's symptoms and I am still  not certain that she still does not have some with perhaps some clinical  depression involved in this.           ______________________________  Argentina Donovan, MD     PR/MEDQ  D:  08/16/2009  T:  08/17/2009  Job:  [id number]

## 2010-10-04 NOTE — Assessment & Plan Note (Signed)
NAME:  Natalie Duffy, Natalie Duffy            ACCOUNT NO.:  [id number]   MEDICAL RECORD NO.:  [id number]          PATIENT TYPE:  POB   LOCATION:  [facility] at [facility]         FACILITY:  [facility]   PHYSICIAN:  Argentina Donovan, MD        DATE OF BIRTH:  1981/02/17   DATE OF SERVICE:  09/06/2009                                  CLINIC NOTE   The patient is a 30 year old Caucasian female, gravida 4, para 0-3-1-3,  who has a baby of 53 months old.  Please see previous dictation.  The  patient has had problems with anxiety and abdominal pain and on her last  visit, she tested positive for Chlamydia.  Because of multiple allergies  to medications, she was placed on doxycycline 100 b.i.d. for 7 days and  she was in today to test for 2-hour which we did.  During that time, she  said she has had extreme anxiety problems since she found out about  this, although she has had her husband checked.  In addition to this,  she during her last test here, she had a positive H. pylori test.  I am  not sure what to treat her with because of the allergic problem she has  had with clindamycin, vancomycin, erythromycin, sulfa, penicillin,  amoxicillin, and discussing with the patient it sounds like these  reactions and allergies were real.  She took the doxycycline with no  problem, although she said she started to have some itching in the last  couple days, but that itching has continued and she thinks that may be  related to her seasonal allergies as they often present that way.  However, on antibiotics in the past and even during labor which she is  treated with vancomycin, she developed a rash immediately and then had  swelling in her throat she said, so we are really afraid to put her on a  routine for the H. pylori using the usual group of drugs.  I think we  tried to get her into a gastroenterologist.  She is trying to still work  on that because of the lack of insurance and I will check with  infectious disease and see  if they have any suggestions that may help [place]  there.  Meanwhile because of her anxiety, I have suggested that she go  on Prozac and give that a try.  She takes Xanax in the past and I told  her being on addictive drug, I do not think that was a good idea for her  to use for what may be a long-term problem.   IMPRESSION:  Anxiety, test for 2-hour for Chlamydia, abdominal disorder,  possibly etiology of H. pylori.           ______________________________  Argentina Donovan, MD     PR/MEDQ  D:  09/06/2009  T:  09/07/2009  Job:  [id number]

## 2010-10-04 NOTE — Discharge Summary (Signed)
NAME:  Natalie Duffy, Natalie Duffy            ACCOUNT NO.:  [id number]   MEDICAL RECORD NO.:  [id number]          PATIENT TYPE:  OBV   LOCATION:  9157                          FACILITY:  [facility]   PHYSICIAN:  Horton Chin, MD DATE OF BIRTH:  08/08/1980   DATE OF ADMISSION:  11/18/2008  DATE OF DISCHARGE:  11/19/2008                               DISCHARGE SUMMARY   DISCHARGE DIAGNOSES:  1. Intrauterine pregnancy at 32-4/7th weeks' gestational age.  2. Threatened preterm labor.  3. Lagging growth.   REASON FOR ADMISSION:  Ms. Natalie Duffy is a 30 year old, gravida 4,  para 0-2-1-2, admitted at 32-3/7th week gestation with contractions that  increased in intensity over several days before admission.  She has a  history of preterm delivery x2 at 41 and 35 weeks and has previously  received betamethasone during this pregnancy.  Because of her  contractions, she was admitted for further observation.   HOSPITAL COURSE:  During her initial evaluation, she had a fetal  fibronectin which was negative.  Additionally, because of the patient's  history of lagging growth, an ultrasound was performed which showed an  AFI of 8.16, estimated fetal weight of 3 pounds 9 ounces which is 24th  percentile.  Both of these values are consistent with previous  measurements.  Her umbilical artery Dopplers were 77th percentile.  There is no absent flow or reverse flow.  Her cervical length was 1.38  cm.  The patient receives weekly 17-hydroxyprogesterone injections since  16 weeks' gestation given her history of previous preterm delivery.  The  patient received some Procardia orally during the hospitalization which  decreased her contractions.  Of note, the patient relates that the  Procardia gives her a bit of headache and makes her feel nauseated.  After discussion at length with the patient, I recommended her attempted  to take 30 mg of Procardia XL twice daily.  However, if these  medications gave her a  headache or made her feel nauseated that was not  improved with Phenergan that the patient could discontinue the  medications given that she has not had any significant cervical change.  If her contractions increased, then she most likely would have to resume  the medications.  At the time of discharge, the patient is contracting  approximately once every 10-15 minutes.  The contractions are mild.  A  reevaluation of her cervix reveals fingertip about 50% dilated,  moderately thick, and very high station.  The patient will be discharged  home on modified bedrest.  Recommendations for good fluid hydration as  well as avoiding any intercourse or orgasm.  The patient does have a  followup appointment with her providers at [facility] on Monday.   MEDICATIONS AT DISCHARGE:  1. Procardia XL 30 mg twice daily.  2. Phenergan 25 mg every 6 hours as needed for nausea.  3. Prenatal vitamins daily.   INSTRUCTIONS FOR THE PATIENT:  The patient was instructed on the use of  the above medications.  Additionally, she was instructed on good  hydration, bedrest, and voiding sexual activities.  She has a followup  appointment at  [facility], and she will continue her biweekly testing  for her legging fetal growth.  She will repeat her growth scan and AFI  in 2 weeks with Maternal Fetal Medicine.  She will continue to receive  for 17P injections weekly, and she was instructed to return should her  contractions intensify or her symptoms worsen.   PROCEDURES:  None.   DISPOSITION:  The patient will be discharged in good condition to home.  She will follow up as stated above.      Odie Sera, DO      ______________________________  Horton Chin, MD    MC/MEDQ  D:  11/19/2008  T:  11/20/2008  Job:  [id number]

## 2010-10-04 NOTE — Assessment & Plan Note (Signed)
NAME:  Natalie Duffy, Natalie Duffy            ACCOUNT NO.:  [id number]   MEDICAL RECORD NO.:  [id number]          PATIENT TYPE:  POB   LOCATION:  [facility] at [facility]         FACILITY:  [facility]   PHYSICIAN:  Tinnie Gens, MD        DATE OF BIRTH:  [birth year]   DATE OF SERVICE:  01/18/2010                                  CLINIC NOTE   The patient comes to the office today for followup on her migraine  headaches.  The patient does feel that she has improved.  She did finish  her prednisone this morning.  She has been taking her on doxepin nightly  and she feels that both things have been helpful.  She has not had a  headache for days yet, but she has certainly improved.  This week she  has been also in the process of stopping all caffeine after 12 noon, in  fact she has cut back on her coffee in the morning from 3 and 4 cups  down to 1 cup.  She feels that she is really making an effort to  decrease the caffeine in her diet.  She is also making attempts to  settle down at night prior to going to bed.  It does not always work out  that way, but she is certainly trying very hard.  She is also trying to  limit her cigarettes.  She does in general feel that she is feeling  better.  Her weight is up 4 pounds for which she is happy and she feels  that she has not had any bad episodes at work this week and she is happy  for that as well.   PHYSICAL EXAMINATION:  GENERAL:  Well-developed, well-nourished 29-year-  old Caucasian female in no acute distress.  VITAL SIGNS:  Blood pressure is 134/78, pulse is 84, weight is 100,  height is 5 feet 3 inches.   ASSESSMENT:  Migraine without aura, anxiety, depression, insomnia.   PLAN:  We will go up on her doxepin.  She is instructed to take 20-30 mg  and use the bottle that she has already purchased, then she will be  given a new prescription for doxepin 25 mg 1 or 2 p.o. nightly #60 with  1 refill.  She has also been using her Thorazine for rescue.  It has  been helpful, but has not knocked her out at any point.  She is  authorized to use 2 tablets if need be.  She will continue to take time  for herself.  We had discussed doing medication and bible reading at  night prior to bedtime.  She is also encouraged to be quiet in alone  time.  She will return to the office in 1 month or sooner if need be.      Remonia Richter, NP    ______________________________  Tinnie Gens, MD    LR/MEDQ  D:  01/18/2010  T:  01/19/2010  Job:  [id number]

## 2010-10-05 NOTE — Assessment & Plan Note (Signed)
NAME:  Natalie Duffy, Natalie Duffy            ACCOUNT NO.:  [id number]  MEDICAL RECORD NO.:  [id number]           PATIENT TYPE:  LOCATION:  [facility]           FACILITY:  PHYSICIAN:  Argentina Donovan, MD        DATE OF BIRTH:  1981/04/08  DATE OF SERVICE:  10/03/2010                                 CLINIC NOTE  The patient is a 30 year old white female who has been seen by Remonia Richter for migraines and the only medication she really is on [place] birth control pills and doxepin 50 mg a day.  Before she started that, she started a tremor and she has had the tremor which seems to be bothering her, especially at work.  People are asking her about it now.  She is 5 feet 3 inches tall, weighs 111 pounds, her blood pressure 116/73 and she has a definite tremor, although it is very fine. I am going to check her thyroid and I am going to place her on some Inderal 20 mg b.i.d. to start with and if it comes back or if she continues having it and if the thyroid is normal, we will probably refer her to a neurologist.  IMPRESSION:  Fine motor tremor in a young woman 30 years old.          ______________________________ Argentina Donovan, MD    PR/MEDQ  D:  10/03/2010  T:  10/04/2010  Job:  [id number]

## 2011-01-10 ENCOUNTER — Telehealth: Payer: Self-pay

## 2011-01-10 NOTE — Telephone Encounter (Signed)
Natalie Duffy, This patient called wanting to know if you can please call one round of prednisone to her pharmacy [facility] in [place]. Thanks

## 2011-01-10 NOTE — Telephone Encounter (Signed)
Dr. Macon Large, this patient called and wanted to know if she can have refills on her Ativan 1mg . You gave her a Rx on 06/01/10 # 30 with 3 refills. On your note you mention that if she needs more that she might need a Psych referral. Please call it in to her [facility] in [place]. Let me know if I need to set her up for a Psych. Thanks! Her last visit here at our office was in May she has not been abstracted to the system yet.

## 2011-01-16 ENCOUNTER — Telehealth: Payer: Self-pay | Admitting: *Deleted

## 2011-01-16 MED ORDER — PREDNISONE 20 MG PO TABS: 20.0000 mg | ORAL_TABLET | Freq: Every day | ORAL | Status: AC

## 2011-01-16 NOTE — Telephone Encounter (Signed)
Patient given refill and advised she needed to be seen per [facility].

## 2011-01-19 ENCOUNTER — Other Ambulatory Visit: Payer: Self-pay | Admitting: *Deleted

## 2011-01-19 DIAGNOSIS — F419 Anxiety disorder, unspecified: Secondary | ICD-10-CM

## 2011-01-19 MED ORDER — LORAZEPAM 1 MG PO TABS: 1.0000 mg | ORAL_TABLET | Freq: Three times a day (TID) | ORAL | Status: AC

## 2011-01-19 NOTE — Telephone Encounter (Signed)
Patient is requesting refill of Ativan.  She is not taking it every day, only when needed due to separation/divorce getting worked out.

## 2011-01-19 NOTE — Telephone Encounter (Signed)
No more Ativan for this patient.  She needs a psychiatrist as previously advised for any further psychotropic medications.

## 2011-01-30 ENCOUNTER — Encounter: Payer: Self-pay | Admitting: Gynecology

## 2011-01-31 ENCOUNTER — Ambulatory Visit (INDEPENDENT_AMBULATORY_CARE_PROVIDER_SITE_OTHER): Payer: Self-pay | Admitting: Nurse Practitioner

## 2011-01-31 ENCOUNTER — Encounter: Payer: Self-pay | Admitting: Nurse Practitioner

## 2011-01-31 VITALS — BP 133/78 | HR 93 | Ht 63.0 in

## 2011-01-31 DIAGNOSIS — N390 Urinary tract infection, site not specified: Secondary | ICD-10-CM

## 2011-01-31 DIAGNOSIS — F17200 Nicotine dependence, unspecified, uncomplicated: Secondary | ICD-10-CM | POA: Insufficient documentation

## 2011-01-31 DIAGNOSIS — G43909 Migraine, unspecified, not intractable, without status migrainosus: Secondary | ICD-10-CM

## 2011-01-31 LAB — POCT URINALYSIS DIPSTICK
Bilirubin, UA: NEGATIVE
Glucose, UA: NEGATIVE
Ketones, UA: NEGATIVE
Nitrite, UA: NEGATIVE

## 2011-01-31 MED ORDER — CIPROFLOXACIN HCL 500 MG PO TABS: 500.0000 mg | ORAL_TABLET | Freq: Two times a day (BID) | ORAL | Status: AC

## 2011-01-31 MED ORDER — IBUPROFEN 800 MG PO TABS: 800.0000 mg | ORAL_TABLET | Freq: Three times a day (TID) | ORAL | Status: AC | PRN

## 2011-01-31 MED ORDER — SUMATRIPTAN SUCCINATE 100 MG PO TABS: 100.0000 mg | ORAL_TABLET | Freq: Once | ORAL | Status: DC | PRN

## 2011-01-31 MED ORDER — DOXEPIN HCL 100 MG PO CAPS: ORAL_CAPSULE | ORAL | Status: DC

## 2011-01-31 NOTE — Progress Notes (Signed)
Patient is here for headache follow up.  She also has symptoms of UTI .  Urine dip is positive for leukocytes and blood.  She is allergic to many antibiotics but can take cipro.

## 2011-01-31 NOTE — Progress Notes (Signed)
S: Pt has 2 problems first is Sx of UTI and she has taken cipro in past. Second is when she gets a migraine she does not have any thing to take. Has taken prednisone taper about 2 weeks ago. She continues to be stressed with divorce and children involved with that. Things going well with new boyfriend. Was seen in May for fine tremor by Dr Okey Dupre and was prescribed Inderal. She does not take that every day, just when tremor is active. She also did not go up on Doxepin and would like to now.  O: PE negative  Lab urine dip positive blood and leukocytes  A: migraine  UTI   P: Cipro  Increase doxepin to 200mg  Refill motrin Start Imitrex 100mg 

## 2011-02-02 LAB — CULTURE, URINE COMPREHENSIVE: Organism ID, Bacteria: NO GROWTH

## 2011-02-07 ENCOUNTER — Emergency Department: Payer: Self-pay | Admitting: *Deleted

## 2011-02-22 ENCOUNTER — Telehealth: Payer: Self-pay | Admitting: *Deleted

## 2011-02-22 DIAGNOSIS — R301 Vesical tenesmus: Secondary | ICD-10-CM

## 2011-02-22 DIAGNOSIS — N3010 Interstitial cystitis (chronic) without hematuria: Secondary | ICD-10-CM

## 2011-02-22 MED ORDER — PHENAZOPYRIDINE HCL 200 MG PO TABS: 200.0000 mg | ORAL_TABLET | Freq: Three times a day (TID) | ORAL | Status: DC | PRN

## 2011-02-22 NOTE — Telephone Encounter (Signed)
Patient called to discuss her hospital results from [facility].  Her labs still showed blood and trace amount of leukocytes.  Her CT was negative for stones.  She continues to hurt in her lower abdomen and kidney area of her back.  Sex makes it more painful.  Patient recalls that a while ago she was treated for interstitial cystitis.  Being that all of her tests are negative, we feel like this might be what is going on.  She has not insurance right now and can not afford to go to the urologist.    I advised patient to take Ibuprofin or Aleve  on a consistant basis for a couple of weeks along with some pyridium that hopefully will relieve the spasms that she is feeling.  This episode has been going on for four weeks now.  The last time she had to go for several sulfa treatments directly into her bladder.    She will call me back and give me an update next week as to how she is feeling.

## 2011-04-17 ENCOUNTER — Other Ambulatory Visit: Payer: Self-pay

## 2011-04-17 MED ORDER — CIPROFLOXACIN HCL 500 MG PO TABS: 500.0000 mg | ORAL_TABLET | Freq: Two times a day (BID) | ORAL | Status: AC

## 2011-04-17 NOTE — Progress Notes (Signed)
Patient is having painful pulling sensation in vaginal area everytime she urinates.  She has tried increasing cranberry juice and AZO but this has not helped and it has gotten worse over the week.  Her urine is positive for blood nitrates and leukocytes.  We will call in Cipro for her as she is allergic to several antibiotics.

## 2011-05-25 ENCOUNTER — Other Ambulatory Visit: Payer: Self-pay | Admitting: *Deleted

## 2011-05-25 MED ORDER — PROMETHAZINE HCL 25 MG PO TABS: 25.0000 mg | ORAL_TABLET | Freq: Four times a day (QID) | ORAL | Status: DC | PRN

## 2011-05-25 NOTE — Telephone Encounter (Signed)
Patient is needing a refill of her phenergan for her migraines.

## 2011-06-05 ENCOUNTER — Telehealth: Payer: Self-pay | Admitting: *Deleted

## 2011-06-05 DIAGNOSIS — F419 Anxiety disorder, unspecified: Secondary | ICD-10-CM

## 2011-06-05 MED ORDER — LORAZEPAM 1 MG PO TABS: 1.0000 mg | ORAL_TABLET | Freq: Three times a day (TID) | ORAL | Status: AC

## 2011-06-06 ENCOUNTER — Encounter: Payer: Self-pay | Admitting: Advanced Practice Midwife

## 2011-06-06 NOTE — Telephone Encounter (Signed)
Patient has been scheduled for colpo

## 2011-06-13 ENCOUNTER — Encounter: Payer: Self-pay | Admitting: Obstetrics and Gynecology

## 2011-07-19 ENCOUNTER — Telehealth: Payer: Self-pay | Admitting: *Deleted

## 2011-07-19 MED ORDER — CIPROFLOXACIN HCL 500 MG PO TABS: 500.0000 mg | ORAL_TABLET | Freq: Two times a day (BID) | ORAL | Status: AC

## 2011-07-19 NOTE — Telephone Encounter (Signed)
Patient is calling with blood and her urine and discomfort in her lower abdomen.  She is unable to come in for an appointment due to personal issues.  We will call in Cipro 500mg  for her twice daily and if her symptoms change or persist she will call back to get further advice and appointment.

## 2011-08-02 ENCOUNTER — Ambulatory Visit: Payer: Self-pay | Admitting: Obstetrics and Gynecology

## 2011-10-04 ENCOUNTER — Encounter: Payer: Self-pay | Admitting: Family

## 2011-10-04 ENCOUNTER — Ambulatory Visit (INDEPENDENT_AMBULATORY_CARE_PROVIDER_SITE_OTHER): Payer: Medicaid Other | Admitting: Family

## 2011-10-04 VITALS — BP 135/81 | HR 82 | Temp 97.6°F | Resp 16 | Ht 63.0 in | Wt 107.7 lb

## 2011-10-04 DIAGNOSIS — IMO0001 Reserved for inherently not codable concepts without codable children: Secondary | ICD-10-CM

## 2011-10-04 DIAGNOSIS — R350 Frequency of micturition: Secondary | ICD-10-CM

## 2011-10-04 DIAGNOSIS — Z309 Encounter for contraceptive management, unspecified: Secondary | ICD-10-CM

## 2011-10-04 LAB — POCT URINALYSIS DIP (DEVICE)
Hgb urine dipstick: NEGATIVE
Leukocytes, UA: NEGATIVE
Nitrite: NEGATIVE
Protein, ur: NEGATIVE mg/dL
Urobilinogen, UA: 1 mg/dL (ref 0.0–1.0)
pH: 7 (ref 5.0–8.0)

## 2011-10-04 MED ORDER — NORETHIN-ETH ESTRAD-FE BIPHAS 1 MG-10 MCG / 10 MCG PO TABS: 1.0000 | ORAL_TABLET | Freq: Every day | ORAL | Status: DC

## 2011-10-04 NOTE — Progress Notes (Signed)
Addended by: Melissa Noon on: 10/04/2011 04:52 PM   Modules accepted: Orders

## 2011-10-04 NOTE — Progress Notes (Signed)
Pt was scheduled for colpo in Jan. 2013 and could not keep appt due to financial reasons.  Pt c/o of UTI sx- CCUA done.  Pt also states that she needs refill Rx for OCP's , doxepin and ativan

## 2011-10-04 NOTE — Progress Notes (Signed)
Addended by: Lynnell Dike on: 10/04/2011 05:04 PM   Modules accepted: Orders

## 2011-10-04 NOTE — Progress Notes (Addendum)
  Subjective:    Patient ID: Natalie Duffy, female    DOB: 11/21/1980, 31 y.o.   MRN: [id number]  HPI Pt is here for pap smear.  Prior patient at [facility], but sent here due to no insurance per pt.  Reports having cervical cancer in 2004 and a LEEP/[facility] was completed (couldn't find records).  Pt was scheduled for a colposcopy but did not show.  Called [facility] and they were going to locate information and pass to clinic.     Review of Systems  Genitourinary: Positive for menstrual problem (cramping and irregular bleeding).  All other systems reviewed and are negative.       Objective:   Physical Exam  Constitutional: She is oriented to person, place, and time. She appears well-developed and well-nourished. No distress.  HENT:  Head: Normocephalic and atraumatic.  Neck: Normal range of motion. Neck supple.  Abdominal: Soft. Bowel sounds are normal. There is no tenderness.  Genitourinary: Vagina normal and uterus normal. Cervix exhibits no motion tenderness, no discharge and no friability. Right adnexum displays no mass and no tenderness. Left adnexum displays no mass and no tenderness.  Neurological: She is alert and oriented to person, place, and time.  Skin: Skin is warm and dry.   UA - nitrites negative, leu negative       Assessment & Plan:  Hx of Abnormal Pap  Plan: DC to home Pap smear to lab Obtain records from [facility] application Return in one week for colposcopy per Electronic Documentation [place]

## 2011-11-17 ENCOUNTER — Encounter: Payer: Self-pay | Admitting: Family Medicine

## 2011-12-27 ENCOUNTER — Ambulatory Visit (INDEPENDENT_AMBULATORY_CARE_PROVIDER_SITE_OTHER): Payer: Self-pay | Admitting: Family Medicine

## 2011-12-27 DIAGNOSIS — Z3480 Encounter for supervision of other normal pregnancy, unspecified trimester: Secondary | ICD-10-CM

## 2011-12-27 MED ORDER — PRENATAL VITAMINS 0.8 MG PO TABS: 1.0000 | ORAL_TABLET | Freq: Every day | ORAL | Status: DC

## 2011-12-28 LAB — OBSTETRIC PANEL
Basophils Absolute: 0 10*3/uL (ref 0.0–0.1)
Basophils Relative: 0 % (ref 0–1)
Eosinophils Absolute: 0.1 10*3/uL (ref 0.0–0.7)
Hemoglobin: 13.2 g/dL (ref 12.0–15.0)
Hepatitis B Surface Ag: NEGATIVE
MCH: 28.4 pg (ref 26.0–34.0)
MCHC: 34.1 g/dL (ref 30.0–36.0)
Monocytes Relative: 5 % (ref 3–12)
Neutrophils Relative %: 69 % (ref 43–77)
Platelets: 225 10*3/uL (ref 150–400)
RDW: 14 % (ref 11.5–15.5)
Rh Type: POSITIVE

## 2011-12-29 LAB — URINE CULTURE
Colony Count: NO GROWTH
Organism ID, Bacteria: NO GROWTH

## 2012-01-02 LAB — CYSTIC FIBROSIS DIAGNOSTIC STUDY: Date of Birth: [birth year]

## 2012-01-08 ENCOUNTER — Ambulatory Visit (INDEPENDENT_AMBULATORY_CARE_PROVIDER_SITE_OTHER): Payer: Self-pay | Admitting: Obstetrics & Gynecology

## 2012-01-08 ENCOUNTER — Encounter: Payer: Self-pay | Admitting: Obstetrics & Gynecology

## 2012-01-08 ENCOUNTER — Other Ambulatory Visit: Payer: Self-pay | Admitting: Obstetrics & Gynecology

## 2012-01-08 VITALS — BP 119/79 | Wt 106.0 lb

## 2012-01-08 DIAGNOSIS — Z3480 Encounter for supervision of other normal pregnancy, unspecified trimester: Secondary | ICD-10-CM | POA: Insufficient documentation

## 2012-01-08 DIAGNOSIS — Z3682 Encounter for antenatal screening for nuchal translucency: Secondary | ICD-10-CM

## 2012-01-08 DIAGNOSIS — O09219 Supervision of pregnancy with history of pre-term labor, unspecified trimester: Secondary | ICD-10-CM

## 2012-01-08 DIAGNOSIS — O34219 Maternal care for unspecified type scar from previous cesarean delivery: Secondary | ICD-10-CM

## 2012-01-08 NOTE — Progress Notes (Signed)
   Subjective:    Natalie Duffy is a [id number] [redacted]w[redacted]d being seen today for her first obstetrical visit.  Her obstetrical history is significant for preterm del twice. She had 17 OH-P with her third child and delivered at 37 weeks.. Patient does not intend to breast feed. Pregnancy history fully reviewed.  Patient reports no complaints.  Filed Vitals:   01/08/12 1517  BP: 119/79  Weight: 106 lb (48.081 kg)    HISTORY: OB History    Grav Para Term Preterm Abortions TAB SAB Ect Mult Living   4 3 1 2  0     3     # Outc Date GA Lbr Len/2nd Wgt Sex Del Anes PTL Lv   1 PRE 7/03 [redacted]w[redacted]d  4lb7oz(2.013kg) M SVD None Yes Yes   2 PRE 5/08 [redacted]w[redacted]d  4lb13oz(2.183kg) M LTCS EPI Yes Yes   3 TRM 8/10 [redacted]w[redacted]d  5lb(2.268kg) M SVD EPI Yes Yes   4 CUR              Past Medical History  Diagnosis Date  . Migraine   . Insomnia   . Abnormal Pap smear 2004  . Cancer 2004    cervical  . Interstitial cystitis    Past Surgical History  Procedure Date  . Leep 2004  . [facility] bx 2004  . Cystoscopy 2007  . Cesarean section 2005  . Hysteroscopy 2007   Family History  Problem Relation Age of Onset  . Hypertension Father   . Cancer Maternal Grandmother     lung/brain  . Cancer Paternal Grandfather     lung/prostate cancer     Exam    Uterus:     Pelvic Exam:    Perineum: No Hemorrhoids   Vulva: normal   Vagina:  normal mucosa   pH:    Cervix: anteverted   Adnexa: normal adnexa   Bony Pelvis: android  System: Breast:  normal appearance, no masses or tenderness   Skin: normal coloration and turgor, no rashes    Neurologic: oriented   Extremities: normal strength, tone, and muscle mass   HEENT PERRLA   Mouth/Teeth mucous membranes moist, pharynx normal without lesions   Neck supple   Cardiovascular: regular rate and rhythm   Respiratory:  appears well, vitals normal, no respiratory distress, acyanotic, normal RR, ear and throat exam is normal, neck free of mass or lymphadenopathy, chest  clear, no wheezing, crepitations, rhonchi, normal symmetric air entry   Abdomen: soft, non-tender; bowel sounds normal; no masses,  no organomegaly   Urinary: urethral meatus normal      Assessment:    Pregnancy: [id number] Patient Active Problem List  Diagnosis  . SYNCOPE  . PALPITATIONS  . Smoker  . Migraine        Plan:     Initial labs drawn. Prenatal vitamins. Problem list reviewed and updated. Genetic Screening discussed First Screen: requested.  Ultrasound discussed; fetal survey: requested.  Follow up in 4 weeks She will get 17 OH-P starting at 16 weeks   Natalie Duffy. 01/08/2012

## 2012-01-08 NOTE — Progress Notes (Signed)
New ob, bedside abdominal ultrasound shows edc of 09/05/2012 at 5weeks and 4 days with positive fhr.  Patient has not had a period she was off birth control for one month and her cycle never started.  She is unsure of dates.  She had a pap at [facility] hospital clinic in May.

## 2012-01-24 ENCOUNTER — Other Ambulatory Visit (INDEPENDENT_AMBULATORY_CARE_PROVIDER_SITE_OTHER): Payer: Self-pay | Admitting: *Deleted

## 2012-01-24 DIAGNOSIS — N390 Urinary tract infection, site not specified: Secondary | ICD-10-CM

## 2012-01-24 LAB — POCT URINALYSIS DIPSTICK
Ketones, UA: NEGATIVE
Protein, UA: NEGATIVE
Spec Grav, UA: 1.01
pH, UA: 7

## 2012-01-24 MED ORDER — CIPROFLOXACIN HCL 500 MG PO TABS: 500.0000 mg | ORAL_TABLET | Freq: Two times a day (BID) | ORAL | Status: AC

## 2012-01-24 NOTE — Addendum Note (Signed)
Addended by: Barbara Cower on: 01/24/2012 05:03 PM   Modules accepted: Level of Service

## 2012-01-24 NOTE — Progress Notes (Signed)
Patient has increased low back pain and cramping,  Feels like a uti.

## 2012-02-05 ENCOUNTER — Ambulatory Visit (INDEPENDENT_AMBULATORY_CARE_PROVIDER_SITE_OTHER): Payer: Self-pay | Admitting: Obstetrics and Gynecology

## 2012-02-05 VITALS — BP 116/72 | Wt 111.0 lb

## 2012-02-05 DIAGNOSIS — O09219 Supervision of pregnancy with history of pre-term labor, unspecified trimester: Secondary | ICD-10-CM

## 2012-02-05 DIAGNOSIS — Z3480 Encounter for supervision of other normal pregnancy, unspecified trimester: Secondary | ICD-10-CM

## 2012-02-05 DIAGNOSIS — O34219 Maternal care for unspecified type scar from previous cesarean delivery: Secondary | ICD-10-CM

## 2012-02-05 NOTE — Progress Notes (Signed)
Patient doing well without complaints. Scheduled for first screen on 10/9.

## 2012-02-05 NOTE — Progress Notes (Signed)
Routine prenatal check, doing well, still having same left side pain.

## 2012-02-21 ENCOUNTER — Other Ambulatory Visit (INDEPENDENT_AMBULATORY_CARE_PROVIDER_SITE_OTHER): Payer: Self-pay | Admitting: Gynecology

## 2012-02-21 DIAGNOSIS — O219 Vomiting of pregnancy, unspecified: Secondary | ICD-10-CM

## 2012-02-21 MED ORDER — PROMETHAZINE HCL 25 MG PO TABS: 25.0000 mg | ORAL_TABLET | Freq: Four times a day (QID) | ORAL | Status: DC | PRN

## 2012-02-21 NOTE — Progress Notes (Signed)
Patient was seen today for fingerstick  Blood glucose. Patient glucose was 131. Message send to Dr. Macon Large. Patient advise to intake adequate fluid and staying away form sweets. Patient also advise to call office if symptom occur. Refill was send to her pharmacy for her phenergan.Marland Kitchen

## 2012-02-22 ENCOUNTER — Encounter (HOSPITAL_COMMUNITY): Payer: Self-pay | Admitting: Obstetrics & Gynecology

## 2012-02-28 ENCOUNTER — Encounter (HOSPITAL_COMMUNITY): Payer: Self-pay

## 2012-02-28 ENCOUNTER — Other Ambulatory Visit: Payer: Self-pay

## 2012-02-28 ENCOUNTER — Ambulatory Visit (HOSPITAL_COMMUNITY)
Admission: RE | Admit: 2012-02-28 | Discharge: 2012-02-28 | Disposition: A | Discharge status: Home / Self Care | Payer: Self-pay | Source: Ambulatory Visit | Attending: Obstetrics & Gynecology | Admitting: Obstetrics & Gynecology

## 2012-02-28 DIAGNOSIS — O3510X0 Maternal care for (suspected) chromosomal abnormality in fetus, unspecified, not applicable or unspecified: Secondary | ICD-10-CM | POA: Insufficient documentation

## 2012-02-28 DIAGNOSIS — O351XX0 Maternal care for (suspected) chromosomal abnormality in fetus, not applicable or unspecified: Secondary | ICD-10-CM | POA: Insufficient documentation

## 2012-02-28 DIAGNOSIS — Z3682 Encounter for antenatal screening for nuchal translucency: Secondary | ICD-10-CM

## 2012-02-28 DIAGNOSIS — O09219 Supervision of pregnancy with history of pre-term labor, unspecified trimester: Secondary | ICD-10-CM | POA: Insufficient documentation

## 2012-02-28 DIAGNOSIS — Z3689 Encounter for other specified antenatal screening: Secondary | ICD-10-CM | POA: Insufficient documentation

## 2012-02-28 DIAGNOSIS — Z8751 Personal history of pre-term labor: Secondary | ICD-10-CM | POA: Insufficient documentation

## 2012-02-28 NOTE — Progress Notes (Signed)
Natalie Duffy  was seen today for an ultrasound appointment.  See full report in [facility].  Alpha Gula, MD  Single IUP at 13 5/7 weeks by ultrasound today NT of 1.3 mm noted.  A nasal bone was visualized. First trimester screen performed as above.  Recommend follow up ultrasound for anatomy at approximately 18 weeks.  Please contact our office if you would like this ultrasound to be performed here. Offer [facility] in the second trimester for ONTD screening.

## 2012-03-04 ENCOUNTER — Ambulatory Visit (INDEPENDENT_AMBULATORY_CARE_PROVIDER_SITE_OTHER): Payer: Self-pay | Admitting: Family Medicine

## 2012-03-04 ENCOUNTER — Encounter: Payer: Self-pay | Admitting: Family Medicine

## 2012-03-04 VITALS — BP 107/70 | Wt 111.0 lb

## 2012-03-04 DIAGNOSIS — Z3480 Encounter for supervision of other normal pregnancy, unspecified trimester: Secondary | ICD-10-CM

## 2012-03-04 NOTE — Patient Instructions (Signed)
Breastfeeding Deciding to breastfeed is one of the best choices you can make for you and your baby. The information that follows gives a brief overview of the benefits of breastfeeding as well as common topics surrounding breastfeeding. BENEFITS OF BREASTFEEDING For the baby  The first milk (colostrum) helps the baby's digestive system function better.   There are antibodies in the mother's milk that help the baby fight off infections.   The baby has a lower incidence of asthma, allergies, and sudden infant death syndrome (SIDS).   The nutrients in breast milk are better for the baby than infant formulas, and breast milk helps the baby's brain grow better.   Babies who breastfeed have less gas, colic, and constipation.  For the mother  Breastfeeding helps develop a very special bond between the mother and her baby.   Breastfeeding is convenient, always available at the correct temperature, and costs nothing.   Breastfeeding burns calories in the mother and helps her lose weight that was gained during pregnancy.   Breastfeeding makes the uterus contract back down to normal size faster and slows bleeding following delivery.   Breastfeeding mothers have a lower risk of developing breast cancer.  BREASTFEEDING FREQUENCY  A healthy, full-term baby may breastfeed as often as every hour or space his or her feedings to every 3 hours.   Watch your baby for signs of hunger. Nurse your baby if he or she shows signs of hunger. How often you nurse will vary from baby to baby.   Nurse as often as the baby requests, or when you feel the need to reduce the fullness of your breasts.   Awaken the baby if it has been 3 4 hours since the last feeding.   Frequent feeding will help the mother make more milk and will help prevent problems, such as sore nipples and engorgement of the breasts.  BABY'S POSITION AT THE BREAST  Whether lying down or sitting, be sure that the baby's tummy is  facing your tummy.   Support the breast with 4 fingers underneath the breast and the thumb above. Make sure your fingers are well away from the nipple and baby's mouth.   Stroke the baby's lips gently with your finger or nipple.   When the baby's mouth is open wide enough, place all of your nipple and as much of the areola as possible into your baby's mouth.   Pull the baby in close so the tip of the nose and the baby's cheeks touch the breast during the feeding.  FEEDINGS AND SUCTION  The length of each feeding varies from baby to baby and from feeding to feeding.   The baby must suck about 2 3 minutes for your milk to get to him or her. This is called a "let down." For this reason, allow the baby to feed on each breast as long as he or she wants. Your baby will end the feeding when he or she has received the right balance of nutrients.   To break the suction, put your finger into the corner of the baby's mouth and slide it between his or her gums before removing your breast from his or her mouth. This will help prevent sore nipples.  HOW TO TELL WHETHER YOUR BABY IS GETTING ENOUGH BREAST MILK. Wondering whether or not your baby is getting enough milk is a common concern among mothers. You can be assured that your baby is getting enough milk if:   Your baby is actively   sucking and you hear swallowing.   Your baby seems relaxed and satisfied after a feeding.   Your baby nurses at least 8 12 times in a 24 hour time period. Nurse your baby until he or she unlatches or falls asleep at the first breast (at least 10 20 minutes), then offer the second side.   Your baby is wetting 5 6 disposable diapers (6 8 cloth diapers) in a 24 hour period by 5 6 days of age.   Your baby is having at least 3 4 stools every 24 hours for the first 6 weeks. The stool should be soft and yellow.   Your baby should gain 4 7 ounces per week after he or she is 4 days old.   Your breasts feel softer  after nursing.  REDUCING BREAST ENGORGEMENT  In the first week after your baby is born, you may experience signs of breast engorgement. When breasts are engorged, they feel heavy, warm, full, and may be tender to the touch. You can reduce engorgement if you:   Nurse frequently, every 2 3 hours. Mothers who breastfeed early and often have fewer problems with engorgement.   Place light ice packs on your breasts for 10 20 minutes between feedings. This reduces swelling. Wrap the ice packs in a lightweight towel to protect your skin. Bags of frozen vegetables work well for this purpose.   Take a warm shower or apply warm, moist heat to your breast for 5 10 minutes just before each feeding. This increases circulation and helps the milk flow.   Gently massage your breast before and during the feeding. Using your finger tips, massage from the chest wall towards your nipple in a circular motion.   Make sure that the baby empties at least one breast at every feeding before switching sides.   Use a breast pump to empty the breasts if your baby is sleepy or not nursing well. You may also want to pump if you are returning to work oryou feel you are getting engorged.   Avoid bottle feeds, pacifiers, or supplemental feedings of water or juice in place of breastfeeding. Breast milk is all the food your baby needs. It is not necessary for your baby to have water or formula. In fact, to help your breasts make more milk, it is best not to give your baby supplemental feedings during the early weeks.   Be sure the baby is latched on and positioned properly while breastfeeding.   Wear a supportive bra, avoiding underwire styles.   Eat a balanced diet with enough fluids.   Rest often, relax, and take your prenatal vitamins to prevent fatigue, stress, and anemia.  If you follow these suggestions, your engorgement should improve in 24 48 hours. If you are still experiencing difficulty, call your  lactation consultant or caregiver.  CARING FOR YOURSELF Take care of your breasts  Bathe or shower daily.   Avoid using soap on your nipples.   Start feedings on your left breast at one feeding and on your right breast at the next feeding.   You will notice an increase in your milk supply 2 5 days after delivery. You may feel some discomfort from engorgement, which makes your breasts very firm and often tender. Engorgement "peaks" out within 24 48 hours. In the meantime, apply warm moist towels to your breasts for 5 10 minutes before feeding. Gentle massage and expression of some milk before feeding will soften your breasts, making it easier for your   baby to latch on.   Wear a well-fitting nursing bra, and air dry your nipples for a 3 4minutes after each feeding.   Only use cotton bra pads.   Only use pure lanolin on your nipples after nursing. You do not need to wash it off before feeding the baby again. Another option is to express a few drops of breast milk and gently massage it into your nipples.  Take care of yourself  Eat well-balanced meals and nutritious snacks.   Drinking milk, fruit juice, and water to satisfy your thirst (about 8 glasses a day).   Get plenty of rest.  Avoid foods that you notice affect the baby in a bad way.  SEEK MEDICAL CARE IF:   You have difficulty with breastfeeding and need help.   You have a hard, red, sore area on your breast that is accompanied by a fever.   Your baby is too sleepy to eat well or is having trouble sleeping.   Your baby is wetting less than 6 diapers a day, by 5 days of age.   Your baby's skin or white part of his or her eyes is more yellow than it was in the hospital.   You feel depressed.  Document Released: 05/08/2005 Document Revised: 11/07/2011 Document Reviewed: 08/06/2011 [employer] Patient Information 2013 [employer].   Pregnancy - Second Trimester The second trimester of pregnancy (3 to 6  months) is a period of rapid growth for you and your baby. At the end of the sixth month, your baby is about 9 inches long and weighs 1 1/2 pounds. You will begin to feel the baby move between 18 and 20 weeks of the pregnancy. This is called quickening. Weight gain is faster. A clear fluid (colostrum) may leak out of your breasts. You may feel small contractions of the womb (uterus). This is known as false labor or Braxton-Hicks contractions. This is like a practice for labor when the baby is ready to be born. Usually, the problems with morning sickness have usually passed by the end of your first trimester. Some women develop small dark blotches (called cholasma, mask of pregnancy) on their face that usually goes away after the baby is born. Exposure to the sun makes the blotches worse. Acne may also develop in some pregnant women and pregnant women who have acne, may find that it goes away. PRENATAL EXAMS  Blood work may continue to be done during prenatal exams. These tests are done to check on your health and the probable health of your baby. Blood work is used to follow your blood levels (hemoglobin). Anemia (low hemoglobin) is common during pregnancy. Iron and vitamins are given to help prevent this. You will also be checked for diabetes between 24 and 28 weeks of the pregnancy. Some of the previous blood tests may be repeated.  The size of the uterus is measured during each visit. This is to make sure that the baby is continuing to grow properly according to the dates of the pregnancy.  Your blood pressure is checked every prenatal visit. This is to make sure you are not getting toxemia.  Your urine is checked to make sure you do not have an infection, diabetes or protein in the urine.  Your weight is checked often to make sure gains are happening at the suggested rate. This is to ensure that both you and your baby are growing normally.  Sometimes, an ultrasound is performed to confirm the proper  growth and development   of the baby. This is a test which bounces harmless sound waves off the baby so your caregiver can more accurately determine due dates. Sometimes, a specialized test is done on the amniotic fluid surrounding the baby. This test is called an amniocentesis. The amniotic fluid is obtained by sticking a needle into the belly (abdomen). This is done to check the chromosomes in instances where there is a concern about possible genetic problems with the baby. It is also sometimes done near the end of pregnancy if an early delivery is required. In this case, it is done to help make sure the baby's lungs are mature enough for the baby to live outside of the womb. CHANGES OCCURING IN THE SECOND TRIMESTER OF PREGNANCY Your body goes through many changes during pregnancy. They vary from person to person. Talk to your caregiver about changes you notice that you are concerned about.  During the second trimester, you will likely have an increase in your appetite. It is normal to have cravings for certain foods. This varies from person to person and pregnancy to pregnancy.  Your lower abdomen will begin to bulge.  You may have to urinate more often because the uterus and baby are pressing on your bladder. It is also common to get more bladder infections during pregnancy (pain with urination). You can help this by drinking lots of fluids and emptying your bladder before and after intercourse.  You may begin to get stretch marks on your hips, abdomen, and breasts. These are normal changes in the body during pregnancy. There are no exercises or medications to take that prevent this change.  You may begin to develop swollen and bulging veins (varicose veins) in your legs. Wearing support hose, elevating your feet for 15 minutes, 3 to 4 times a day and limiting salt in your diet helps lessen the problem.  Heartburn may develop as the uterus grows and pushes up against the stomach. Antacids  recommended by your caregiver helps with this problem. Also, eating smaller meals 4 to 5 times a day helps.  Constipation can be treated with a stool softener or adding bulk to your diet. Drinking lots of fluids, vegetables, fruits, and whole grains are helpful.  Exercising is also helpful. If you have been very active up until your pregnancy, most of these activities can be continued during your pregnancy. If you have been less active, it is helpful to start an exercise program such as walking.  Hemorrhoids (varicose veins in the rectum) may develop at the end of the second trimester. Warm sitz baths and hemorrhoid cream recommended by your caregiver helps hemorrhoid problems.  Backaches may develop during this time of your pregnancy. Avoid heavy lifting, wear low heal shoes and practice good posture to help with backache problems.  Some pregnant women develop tingling and numbness of their hand and fingers because of swelling and tightening of ligaments in the wrist (carpel tunnel syndrome). This goes away after the baby is born.  As your breasts enlarge, you may have to get a bigger bra. Get a comfortable, cotton, support bra. Do not get a nursing bra until the last month of the pregnancy if you will be nursing the baby.  You may get a dark line from your belly button to the pubic area called the linea nigra.  You may develop rosy cheeks because of increase blood flow to the face.  You may develop spider looking lines of the face, neck, arms and chest. These go away after   the baby is born. HOME CARE INSTRUCTIONS   It is extremely important to avoid all smoking, herbs, alcohol, and unprescribed drugs during your pregnancy. These chemicals affect the formation and growth of the baby. Avoid these chemicals throughout the pregnancy to ensure the delivery of a healthy infant.  Most of your home care instructions are the same as suggested for the first trimester of your pregnancy. Keep your  caregiver's appointments. Follow your caregiver's instructions regarding medication use, exercise and diet.  During pregnancy, you are providing food for you and your baby. Continue to eat regular, well-balanced meals. Choose foods such as meat, fish, milk and other low fat dairy products, vegetables, fruits, and whole-grain breads and cereals. Your caregiver will tell you of the ideal weight gain.  A physical sexual relationship may be continued up until near the end of pregnancy if there are no other problems. Problems could include early (premature) leaking of amniotic fluid from the membranes, vaginal bleeding, abdominal pain, or other medical or pregnancy problems.  Exercise regularly if there are no restrictions. Check with your caregiver if you are unsure of the safety of some of your exercises. The greatest weight gain will occur in the last 2 trimesters of pregnancy. Exercise will help you:  Control your weight.  Get you in shape for labor and delivery.  Lose weight after you have the baby.  Wear a good support or jogging bra for breast tenderness during pregnancy. This may help if worn during sleep. Pads or tissues may be used in the bra if you are leaking colostrum.  Do not use hot tubs, steam rooms or saunas throughout the pregnancy.  Wear your seat belt at all times when driving. This protects you and your baby if you are in an accident.  Avoid raw meat, uncooked cheese, cat litter boxes and soil used by cats. These carry germs that can cause birth defects in the baby.  The second trimester is also a good time to visit your dentist for your dental health if this has not been done yet. Getting your teeth cleaned is OK. Use a soft toothbrush. Brush gently during pregnancy.  It is easier to loose urine during pregnancy. Tightening up and strengthening the pelvic muscles will help with this problem. Practice stopping your urination while you are going to the bathroom. These are the  same muscles you need to strengthen. It is also the muscles you would use as if you were trying to stop from passing gas. You can practice tightening these muscles up 10 times a set and repeating this about 3 times per day. Once you know what muscles to tighten up, do not perform these exercises during urination. It is more likely to contribute to an infection by backing up the urine.  Ask for help if you have financial, counseling or nutritional needs during pregnancy. Your caregiver will be able to offer counseling for these needs as well as refer you for other special needs.  Your skin may become oily. If so, wash your face with mild soap, use non-greasy moisturizer and oil or cream based makeup. MEDICATIONS AND DRUG USE IN PREGNANCY  Take prenatal vitamins as directed. The vitamin should contain 1 milligram of folic acid. Keep all vitamins out of reach of children. Only a couple vitamins or tablets containing iron may be fatal to a baby or young child when ingested.  Avoid use of all medications, including herbs, over-the-counter medications, not prescribed or suggested by your caregiver. Only take   over-the-counter or prescription medicines for pain, discomfort, or fever as directed by your caregiver. Do not use aspirin.  Let your caregiver also know about herbs you may be using.  Alcohol is related to a number of birth defects. This includes fetal alcohol syndrome. All alcohol, in any form, should be avoided completely. Smoking will cause low birth rate and premature babies.  Street or illegal drugs are very harmful to the baby. They are absolutely forbidden. A baby born to an addicted mother will be addicted at birth. The baby will go through the same withdrawal an adult does. SEEK MEDICAL CARE IF:  You have any concerns or worries during your pregnancy. It is better to call with your questions if you feel they cannot wait, rather than worry about them. SEEK IMMEDIATE MEDICAL CARE IF:   An  unexplained oral temperature above 102 F (38.9 C) develops, or as your caregiver suggests.  You have leaking of fluid from the vagina (birth canal). If leaking membranes are suspected, take your temperature and tell your caregiver of this when you call.  There is vaginal spotting, bleeding, or passing clots. Tell your caregiver of the amount and how many pads are used. Light spotting in pregnancy is common, especially following intercourse.  You develop a bad smelling vaginal discharge with a change in the color from clear to white.  You continue to feel sick to your stomach (nauseated) and have no relief from remedies suggested. You vomit blood or coffee ground-like materials.  You lose more than 2 pounds of weight or gain more than 2 pounds of weight over 1 week, or as suggested by your caregiver.  You notice swelling of your face, hands, feet, or legs.  You get exposed to German measles and have never had them.  You are exposed to fifth disease or chickenpox.  You develop belly (abdominal) pain. Round ligament discomfort is a common non-cancerous (benign) cause of abdominal pain in pregnancy. Your caregiver still must evaluate you.  You develop a bad headache that does not go away.  You develop fever, diarrhea, pain with urination, or shortness of breath.  You develop visual problems, blurry, or double vision.  You fall or are in a car accident or any kind of trauma.  There is mental or physical violence at home. Document Released: 05/02/2001 Document Revised: 07/31/2011 Document Reviewed: 11/04/2008 [employer] Patient Information 2013 [employer].  

## 2012-03-19 ENCOUNTER — Ambulatory Visit: Payer: Self-pay

## 2012-03-21 ENCOUNTER — Other Ambulatory Visit (HOSPITAL_COMMUNITY): Payer: Self-pay | Admitting: Maternal and Fetal Medicine

## 2012-03-21 DIAGNOSIS — O09299 Supervision of pregnancy with other poor reproductive or obstetric history, unspecified trimester: Secondary | ICD-10-CM

## 2012-03-21 DIAGNOSIS — Z0489 Encounter for examination and observation for other specified reasons: Secondary | ICD-10-CM

## 2012-03-25 ENCOUNTER — Ambulatory Visit (INDEPENDENT_AMBULATORY_CARE_PROVIDER_SITE_OTHER): Payer: Self-pay | Admitting: *Deleted

## 2012-03-25 DIAGNOSIS — O09219 Supervision of pregnancy with history of pre-term labor, unspecified trimester: Secondary | ICD-10-CM

## 2012-03-25 MED ORDER — HYDROXYPROGESTERONE CAPROATE 250 MG/ML IM OIL
250.0000 mg | TOPICAL_OIL | INTRAMUSCULAR | Status: DC
  Administered 2012-03-25: 250 mg via INTRAMUSCULAR

## 2012-03-29 ENCOUNTER — Other Ambulatory Visit: Payer: Self-pay

## 2012-03-29 ENCOUNTER — Ambulatory Visit (HOSPITAL_COMMUNITY)
Admission: RE | Admit: 2012-03-29 | Discharge: 2012-03-29 | Disposition: A | Discharge status: Home / Self Care | Payer: Self-pay | Source: Ambulatory Visit | Attending: Obstetrics & Gynecology | Admitting: Obstetrics & Gynecology

## 2012-03-29 ENCOUNTER — Ambulatory Visit (HOSPITAL_COMMUNITY)
Admission: RE | Admit: 2012-03-29 | Discharge: 2012-03-29 | Disposition: A | Discharge status: Home / Self Care | Payer: Self-pay | Source: Ambulatory Visit | Attending: Family Medicine | Admitting: Family Medicine

## 2012-03-29 ENCOUNTER — Other Ambulatory Visit (HOSPITAL_COMMUNITY): Payer: Self-pay | Admitting: Maternal and Fetal Medicine

## 2012-03-29 ENCOUNTER — Other Ambulatory Visit (HOSPITAL_COMMUNITY): Payer: Self-pay

## 2012-03-29 VITALS — BP 134/84 | HR 104 | Wt 115.5 lb

## 2012-03-29 DIAGNOSIS — O09299 Supervision of pregnancy with other poor reproductive or obstetric history, unspecified trimester: Secondary | ICD-10-CM

## 2012-03-29 DIAGNOSIS — IMO0002 Reserved for concepts with insufficient information to code with codable children: Secondary | ICD-10-CM

## 2012-03-29 DIAGNOSIS — Z363 Encounter for antenatal screening for malformations: Secondary | ICD-10-CM | POA: Insufficient documentation

## 2012-03-29 DIAGNOSIS — Z0489 Encounter for examination and observation for other specified reasons: Secondary | ICD-10-CM

## 2012-03-29 DIAGNOSIS — Z8751 Personal history of pre-term labor: Secondary | ICD-10-CM | POA: Insufficient documentation

## 2012-03-29 DIAGNOSIS — O34219 Maternal care for unspecified type scar from previous cesarean delivery: Secondary | ICD-10-CM | POA: Insufficient documentation

## 2012-03-29 DIAGNOSIS — Z1389 Encounter for screening for other disorder: Secondary | ICD-10-CM | POA: Insufficient documentation

## 2012-03-29 DIAGNOSIS — O358XX0 Maternal care for other (suspected) fetal abnormality and damage, not applicable or unspecified: Secondary | ICD-10-CM | POA: Insufficient documentation

## 2012-03-29 NOTE — Progress Notes (Signed)
Natalie Duffy  was seen today for an ultrasound appointment.  See full report in [facility].  Comments: Ms. Natalie Duffy returns for follow up.  Her past OB history is remarkable for 2 prior preterm deliveries.  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  She recently started 17-P injections due to histoy of preterm delivery.  First trimester screen was low risk for aneuploidy.  Ultrasound today reveals borderline / mild ventriculomegaly with the lateral ventricles measuring at approximately 10 mm.  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  No other soft markers are appreciated and the fetal anatomy is otherwise within normal limits.  Additionally, cervical shortening is noted (2.4 cm) without funneling or dynamic changes - this may be consistent with the patient's prior history of a deep cone biopsy.  The findings and limitations of the ultraosund were discussed.  After counseling, the patient elected to undergo cell free fetal DNA testing (Harmony test)  Impression: Single IUP at 18 0/7 weeks Mild / borderline ventriculomegaly.  The cranial anatomy otherwise appears normal Possible absent middle phalanx of the 5th digits Otherwise normal detailed fetal anatomy No other markers associated with Down syndrome noted  TVUS - cervical length of 2.4 cm without funneling or dynamic changes  Recommendations: Follow up in 2 weeks for cervical length Recommend follow up ultrasound in 4 weeks for growth and to reevaluate the lateral ventricles.   Alpha Gula, MD

## 2012-04-01 ENCOUNTER — Encounter: Payer: Self-pay | Admitting: Family Medicine

## 2012-04-01 ENCOUNTER — Ambulatory Visit (INDEPENDENT_AMBULATORY_CARE_PROVIDER_SITE_OTHER): Payer: Self-pay | Admitting: Family Medicine

## 2012-04-01 VITALS — BP 121/82 | Wt 113.0 lb

## 2012-04-01 DIAGNOSIS — Z9889 Other specified postprocedural states: Secondary | ICD-10-CM

## 2012-04-01 DIAGNOSIS — O09219 Supervision of pregnancy with history of pre-term labor, unspecified trimester: Secondary | ICD-10-CM

## 2012-04-01 DIAGNOSIS — Z3480 Encounter for supervision of other normal pregnancy, unspecified trimester: Secondary | ICD-10-CM

## 2012-04-01 DIAGNOSIS — O26872 Cervical shortening, second trimester: Secondary | ICD-10-CM

## 2012-04-01 DIAGNOSIS — O26879 Cervical shortening, unspecified trimester: Secondary | ICD-10-CM

## 2012-04-01 MED ORDER — HYDROXYPROGESTERONE CAPROATE 250 MG/ML IM OIL
250.0000 mg | TOPICAL_OIL | Freq: Once | INTRAMUSCULAR | Status: AC
  Administered 2012-04-01: 250 mg via INTRAMUSCULAR

## 2012-04-01 NOTE — Progress Notes (Signed)
Not gaining weight as appropriate. U/S reviewed.  She is getting Harmony done for abnl u/s and getting serial TVUS for cervical length.  Has h/o CKC and LEEP.  No problems with last pregnancy but h/o PTB x 2.  17 P today.

## 2012-04-01 NOTE — Progress Notes (Signed)
Patient has questions regarding her last ultrasound and cerclage being offered.  She also had a question about the balloon that they used in her cervix the last time she went into labor.

## 2012-04-01 NOTE — Patient Instructions (Signed)
Pregnancy - Second Trimester The second trimester of pregnancy (3 to 6 months) is a period of rapid growth for you and your baby. At the end of the sixth month, your baby is about 9 inches long and weighs 1 1/2 pounds. You will begin to feel the baby move between 18 and 20 weeks of the pregnancy. This is called quickening. Weight gain is faster. A clear fluid (colostrum) may leak out of your breasts. You may feel small contractions of the womb (uterus). This is known as false labor or Braxton-Hicks contractions. This is like a practice for labor when the baby is ready to be born. Usually, the problems with morning sickness have usually passed by the end of your first trimester. Some women develop small dark blotches (called cholasma, mask of pregnancy) on their face that usually goes away after the baby is born. Exposure to the sun makes the blotches worse. Acne may also develop in some pregnant women and pregnant women who have acne, may find that it goes away. PRENATAL EXAMS  Blood work may continue to be done during prenatal exams. These tests are done to check on your health and the probable health of your baby. Blood work is used to follow your blood levels (hemoglobin). Anemia (low hemoglobin) is common during pregnancy. Iron and vitamins are given to help prevent this. You will also be checked for diabetes between 24 and 28 weeks of the pregnancy. Some of the previous blood tests may be repeated.  The size of the uterus is measured during each visit. This is to make sure that the baby is continuing to grow properly according to the dates of the pregnancy.  Your blood pressure is checked every prenatal visit. This is to make sure you are not getting toxemia.  Your urine is checked to make sure you do not have an infection, diabetes or protein in the urine.  Your weight is checked often to make sure gains are happening at the suggested rate. This is to ensure that both you and your baby are  growing normally.  Sometimes, an ultrasound is performed to confirm the proper growth and development of the baby. This is a test which bounces harmless sound waves off the baby so your caregiver can more accurately determine due dates. Sometimes, a specialized test is done on the amniotic fluid surrounding the baby. This test is called an amniocentesis. The amniotic fluid is obtained by sticking a needle into the belly (abdomen). This is done to check the chromosomes in instances where there is a concern about possible genetic problems with the baby. It is also sometimes done near the end of pregnancy if an early delivery is required. In this case, it is done to help make sure the baby's lungs are mature enough for the baby to live outside of the womb. CHANGES OCCURING IN THE SECOND TRIMESTER OF PREGNANCY Your body goes through many changes during pregnancy. They vary from person to person. Talk to your caregiver about changes you notice that you are concerned about.  During the second trimester, you will likely have an increase in your appetite. It is normal to have cravings for certain foods. This varies from person to person and pregnancy to pregnancy.  Your lower abdomen will begin to bulge.  You may have to urinate more often because the uterus and baby are pressing on your bladder. It is also common to get more bladder infections during pregnancy (pain with urination). You can help this by   drinking lots of fluids and emptying your bladder before and after intercourse.  You may begin to get stretch marks on your hips, abdomen, and breasts. These are normal changes in the body during pregnancy. There are no exercises or medications to take that prevent this change.  You may begin to develop swollen and bulging veins (varicose veins) in your legs. Wearing support hose, elevating your feet for 15 minutes, 3 to 4 times a day and limiting salt in your diet helps lessen the problem.  Heartburn may  develop as the uterus grows and pushes up against the stomach. Antacids recommended by your caregiver helps with this problem. Also, eating smaller meals 4 to 5 times a day helps.  Constipation can be treated with a stool softener or adding bulk to your diet. Drinking lots of fluids, vegetables, fruits, and whole grains are helpful.  Exercising is also helpful. If you have been very active up until your pregnancy, most of these activities can be continued during your pregnancy. If you have been less active, it is helpful to start an exercise program such as walking.  Hemorrhoids (varicose veins in the rectum) may develop at the end of the second trimester. Warm sitz baths and hemorrhoid cream recommended by your caregiver helps hemorrhoid problems.  Backaches may develop during this time of your pregnancy. Avoid heavy lifting, wear low heal shoes and practice good posture to help with backache problems.  Some pregnant women develop tingling and numbness of their hand and fingers because of swelling and tightening of ligaments in the wrist (carpel tunnel syndrome). This goes away after the baby is born.  As your breasts enlarge, you may have to get a bigger bra. Get a comfortable, cotton, support bra. Do not get a nursing bra until the last month of the pregnancy if you will be nursing the baby.  You may get a dark line from your belly button to the pubic area called the linea nigra.  You may develop rosy cheeks because of increase blood flow to the face.  You may develop spider looking lines of the face, neck, arms and chest. These go away after the baby is born. HOME CARE INSTRUCTIONS   It is extremely important to avoid all smoking, herbs, alcohol, and unprescribed drugs during your pregnancy. These chemicals affect the formation and growth of the baby. Avoid these chemicals throughout the pregnancy to ensure the delivery of a healthy infant.  Most of your home care instructions are the same  as suggested for the first trimester of your pregnancy. Keep your caregiver's appointments. Follow your caregiver's instructions regarding medication use, exercise and diet.  During pregnancy, you are providing food for you and your baby. Continue to eat regular, well-balanced meals. Choose foods such as meat, fish, milk and other low fat dairy products, vegetables, fruits, and whole-grain breads and cereals. Your caregiver will tell you of the ideal weight gain.  A physical sexual relationship may be continued up until near the end of pregnancy if there are no other problems. Problems could include early (premature) leaking of amniotic fluid from the membranes, vaginal bleeding, abdominal pain, or other medical or pregnancy problems.  Exercise regularly if there are no restrictions. Check with your caregiver if you are unsure of the safety of some of your exercises. The greatest weight gain will occur in the last 2 trimesters of pregnancy. Exercise will help you:  Control your weight.  Get you in shape for labor and delivery.  Lose weight   after you have the baby.  Wear a good support or jogging bra for breast tenderness during pregnancy. This may help if worn during sleep. Pads or tissues may be used in the bra if you are leaking colostrum.  Do not use hot tubs, steam rooms or saunas throughout the pregnancy.  Wear your seat belt at all times when driving. This protects you and your baby if you are in an accident.  Avoid raw meat, uncooked cheese, cat litter boxes and soil used by cats. These carry germs that can cause birth defects in the baby.  The second trimester is also a good time to visit your dentist for your dental health if this has not been done yet. Getting your teeth cleaned is OK. Use a soft toothbrush. Brush gently during pregnancy.  It is easier to loose urine during pregnancy. Tightening up and strengthening the pelvic muscles will help with this problem. Practice stopping  your urination while you are going to the bathroom. These are the same muscles you need to strengthen. It is also the muscles you would use as if you were trying to stop from passing gas. You can practice tightening these muscles up 10 times a set and repeating this about 3 times per day. Once you know what muscles to tighten up, do not perform these exercises during urination. It is more likely to contribute to an infection by backing up the urine.  Ask for help if you have financial, counseling or nutritional needs during pregnancy. Your caregiver will be able to offer counseling for these needs as well as refer you for other special needs.  Your skin may become oily. If so, wash your face with mild soap, use non-greasy moisturizer and oil or cream based makeup. MEDICATIONS AND DRUG USE IN PREGNANCY  Take prenatal vitamins as directed. The vitamin should contain 1 milligram of folic acid. Keep all vitamins out of reach of children. Only a couple vitamins or tablets containing iron may be fatal to a baby or young child when ingested.  Avoid use of all medications, including herbs, over-the-counter medications, not prescribed or suggested by your caregiver. Only take over-the-counter or prescription medicines for pain, discomfort, or fever as directed by your caregiver. Do not use aspirin.  Let your caregiver also know about herbs you may be using.  Alcohol is related to a number of birth defects. This includes fetal alcohol syndrome. All alcohol, in any form, should be avoided completely. Smoking will cause low birth rate and premature babies.  Street or illegal drugs are very harmful to the baby. They are absolutely forbidden. A baby born to an addicted mother will be addicted at birth. The baby will go through the same withdrawal an adult does. SEEK MEDICAL CARE IF:  You have any concerns or worries during your pregnancy. It is better to call with your questions if you feel they cannot wait,  rather than worry about them. SEEK IMMEDIATE MEDICAL CARE IF:   An unexplained oral temperature above 102 F (38.9 C) develops, or as your caregiver suggests.  You have leaking of fluid from the vagina (birth canal). If leaking membranes are suspected, take your temperature and tell your caregiver of this when you call.  There is vaginal spotting, bleeding, or passing clots. Tell your caregiver of the amount and how many pads are used. Light spotting in pregnancy is common, especially following intercourse.  You develop a bad smelling vaginal discharge with a change in the color from clear   to white.  You continue to feel sick to your stomach (nauseated) and have no relief from remedies suggested. You vomit blood or coffee ground-like materials.  You lose more than 2 pounds of weight or gain more than 2 pounds of weight over 1 week, or as suggested by your caregiver.  You notice swelling of your face, hands, feet, or legs.  You get exposed to German measles and have never had them.  You are exposed to fifth disease or chickenpox.  You develop belly (abdominal) pain. Round ligament discomfort is a common non-cancerous (benign) cause of abdominal pain in pregnancy. Your caregiver still must evaluate you.  You develop a bad headache that does not go away.  You develop fever, diarrhea, pain with urination, or shortness of breath.  You develop visual problems, blurry, or double vision.  You fall or are in a car accident or any kind of trauma.  There is mental or physical violence at home. Document Released: 05/02/2001 Document Revised: 07/31/2011 Document Reviewed: 11/04/2008 [employer] Patient Information 2013 [employer].  Breastfeeding Deciding to breastfeed is one of the best choices you can make for you and your baby. The information that follows gives a brief overview of the benefits of breastfeeding as well as common topics surrounding breastfeeding. BENEFITS OF  BREASTFEEDING For the baby  The first milk (colostrum) helps the baby's digestive system function better.   There are antibodies in the mother's milk that help the baby fight off infections.   The baby has a lower incidence of asthma, allergies, and sudden infant death syndrome (SIDS).   The nutrients in breast milk are better for the baby than infant formulas, and breast milk helps the baby's brain grow better.   Babies who breastfeed have less gas, colic, and constipation.  For the mother  Breastfeeding helps develop a very special bond between the mother and her baby.   Breastfeeding is convenient, always available at the correct temperature, and costs nothing.   Breastfeeding burns calories in the mother and helps her lose weight that was gained during pregnancy.   Breastfeeding makes the uterus contract back down to normal size faster and slows bleeding following delivery.   Breastfeeding mothers have a lower risk of developing breast cancer.  BREASTFEEDING FREQUENCY  A healthy, full-term baby may breastfeed as often as every hour or space his or her feedings to every 3 hours.   Watch your baby for signs of hunger. Nurse your baby if he or she shows signs of hunger. How often you nurse will vary from baby to baby.   Nurse as often as the baby requests, or when you feel the need to reduce the fullness of your breasts.   Awaken the baby if it has been 3 4 hours since the last feeding.   Frequent feeding will help the mother make more milk and will help prevent problems, such as sore nipples and engorgement of the breasts.  BABY'S POSITION AT THE BREAST  Whether lying down or sitting, be sure that the baby's tummy is facing your tummy.   Support the breast with 4 fingers underneath the breast and the thumb above. Make sure your fingers are well away from the nipple and baby's mouth.   Stroke the baby's lips gently with your finger or nipple.   When the  baby's mouth is open wide enough, place all of your nipple and as much of the areola as possible into your baby's mouth.   Pull the baby in   close so the tip of the nose and the baby's cheeks touch the breast during the feeding.  FEEDINGS AND SUCTION  The length of each feeding varies from baby to baby and from feeding to feeding.   The baby must suck about 2 3 minutes for your milk to get to him or her. This is called a "let down." For this reason, allow the baby to feed on each breast as long as he or she wants. Your baby will end the feeding when he or she has received the right balance of nutrients.   To break the suction, put your finger into the corner of the baby's mouth and slide it between his or her gums before removing your breast from his or her mouth. This will help prevent sore nipples.  HOW TO TELL WHETHER YOUR BABY IS GETTING ENOUGH BREAST MILK. Wondering whether or not your baby is getting enough milk is a common concern among mothers. You can be assured that your baby is getting enough milk if:   Your baby is actively sucking and you hear swallowing.   Your baby seems relaxed and satisfied after a feeding.   Your baby nurses at least 8 12 times in a 24 hour time period. Nurse your baby until he or she unlatches or falls asleep at the first breast (at least 10 20 minutes), then offer the second side.   Your baby is wetting 5 6 disposable diapers (6 8 cloth diapers) in a 24 hour period by 5 6 days of age.   Your baby is having at least 3 4 stools every 24 hours for the first 6 weeks. The stool should be soft and yellow.   Your baby should gain 4 7 ounces per week after he or she is 4 days old.   Your breasts feel softer after nursing.  REDUCING BREAST ENGORGEMENT  In the first week after your baby is born, you may experience signs of breast engorgement. When breasts are engorged, they feel heavy, warm, full, and may be tender to the touch. You can reduce  engorgement if you:   Nurse frequently, every 2 3 hours. Mothers who breastfeed early and often have fewer problems with engorgement.   Place light ice packs on your breasts for 10 20 minutes between feedings. This reduces swelling. Wrap the ice packs in a lightweight towel to protect your skin. Bags of frozen vegetables work well for this purpose.   Take a warm shower or apply warm, moist heat to your breast for 5 10 minutes just before each feeding. This increases circulation and helps the milk flow.   Gently massage your breast before and during the feeding. Using your finger tips, massage from the chest wall towards your nipple in a circular motion.   Make sure that the baby empties at least one breast at every feeding before switching sides.   Use a breast pump to empty the breasts if your baby is sleepy or not nursing well. You may also want to pump if you are returning to work oryou feel you are getting engorged.   Avoid bottle feeds, pacifiers, or supplemental feedings of water or juice in place of breastfeeding. Breast milk is all the food your baby needs. It is not necessary for your baby to have water or formula. In fact, to help your breasts make more milk, it is best not to give your baby supplemental feedings during the early weeks.   Be sure the baby is latched   on and positioned properly while breastfeeding.   Wear a supportive bra, avoiding underwire styles.   Eat a balanced diet with enough fluids.   Rest often, relax, and take your prenatal vitamins to prevent fatigue, stress, and anemia.  If you follow these suggestions, your engorgement should improve in 24 48 hours. If you are still experiencing difficulty, call your lactation consultant or caregiver.  CARING FOR YOURSELF Take care of your breasts  Bathe or shower daily.   Avoid using soap on your nipples.   Start feedings on your left breast at one feeding and on your right breast at the next  feeding.   You will notice an increase in your milk supply 2 5 days after delivery. You may feel some discomfort from engorgement, which makes your breasts very firm and often tender. Engorgement "peaks" out within 24 48 hours. In the meantime, apply warm moist towels to your breasts for 5 10 minutes before feeding. Gentle massage and expression of some milk before feeding will soften your breasts, making it easier for your baby to latch on.   Wear a well-fitting nursing bra, and air dry your nipples for a 3 4minutes after each feeding.   Only use cotton bra pads.   Only use pure lanolin on your nipples after nursing. You do not need to wash it off before feeding the baby again. Another option is to express a few drops of breast milk and gently massage it into your nipples.  Take care of yourself  Eat well-balanced meals and nutritious snacks.   Drinking milk, fruit juice, and water to satisfy your thirst (about 8 glasses a day).   Get plenty of rest.  Avoid foods that you notice affect the baby in a bad way.  SEEK MEDICAL CARE IF:   You have difficulty with breastfeeding and need help.   You have a hard, red, sore area on your breast that is accompanied by a fever.   Your baby is too sleepy to eat well or is having trouble sleeping.   Your baby is wetting less than 6 diapers a day, by 5 days of age.   Your baby's skin or white part of his or her eyes is more yellow than it was in the hospital.   You feel depressed.  Document Released: 05/08/2005 Document Revised: 11/07/2011 Document Reviewed: 08/06/2011 [employer] Patient Information 2013 [employer].  

## 2012-04-02 DIAGNOSIS — Z9889 Other specified postprocedural states: Secondary | ICD-10-CM | POA: Insufficient documentation

## 2012-04-02 DIAGNOSIS — O26872 Cervical shortening, second trimester: Secondary | ICD-10-CM | POA: Insufficient documentation

## 2012-04-09 ENCOUNTER — Telehealth: Payer: Self-pay | Admitting: *Deleted

## 2012-04-09 ENCOUNTER — Telehealth (HOSPITAL_COMMUNITY): Payer: Self-pay | Admitting: MS"

## 2012-04-09 ENCOUNTER — Encounter: Payer: Self-pay | Admitting: Nurse Practitioner

## 2012-04-09 NOTE — Telephone Encounter (Signed)
Left message for patient to return call.   Natalie Duffy 04/09/2012 2:11 PM

## 2012-04-09 NOTE — Telephone Encounter (Signed)
Patient has been given test results for her first trimester screening and she is reassured.  She has to reschedule her headache appointment due to her kids all have a stomach virus.  She will come in tomorrow for her 17-p injection.

## 2012-04-12 ENCOUNTER — Ambulatory Visit (HOSPITAL_COMMUNITY)
Admission: RE | Admit: 2012-04-12 | Discharge: 2012-04-12 | Disposition: A | Discharge status: Home / Self Care | Payer: Self-pay | Source: Ambulatory Visit | Attending: Obstetrics & Gynecology | Admitting: Obstetrics & Gynecology

## 2012-04-12 VITALS — BP 123/71 | HR 115 | Wt 116.0 lb

## 2012-04-12 DIAGNOSIS — O26879 Cervical shortening, unspecified trimester: Secondary | ICD-10-CM | POA: Insufficient documentation

## 2012-04-12 DIAGNOSIS — O34219 Maternal care for unspecified type scar from previous cesarean delivery: Secondary | ICD-10-CM | POA: Insufficient documentation

## 2012-04-12 DIAGNOSIS — O09299 Supervision of pregnancy with other poor reproductive or obstetric history, unspecified trimester: Secondary | ICD-10-CM

## 2012-04-12 DIAGNOSIS — O350XX0 Maternal care for (suspected) central nervous system malformation in fetus, not applicable or unspecified: Secondary | ICD-10-CM | POA: Insufficient documentation

## 2012-04-12 DIAGNOSIS — Z8751 Personal history of pre-term labor: Secondary | ICD-10-CM | POA: Insufficient documentation

## 2012-04-12 DIAGNOSIS — O3440 Maternal care for other abnormalities of cervix, unspecified trimester: Secondary | ICD-10-CM | POA: Insufficient documentation

## 2012-04-12 DIAGNOSIS — O3500X0 Maternal care for (suspected) central nervous system malformation or damage in fetus, unspecified, not applicable or unspecified: Secondary | ICD-10-CM | POA: Insufficient documentation

## 2012-04-12 DIAGNOSIS — O26872 Cervical shortening, second trimester: Secondary | ICD-10-CM

## 2012-04-12 DIAGNOSIS — IMO0002 Reserved for concepts with insufficient information to code with codable children: Secondary | ICD-10-CM

## 2012-04-12 NOTE — Progress Notes (Signed)
Natalie Duffy  was seen today for an ultrasound appointment.  See full report in [facility].  Comments: Ms. Natalie Duffy returns for follow up due to shortened cervix.  On previous ultrasound, mild ventriculomegaly and possible absent middle phalanx of the 5th digit was noted.  Cell free fetal DNA ([facility] test) was low risk for aneuploidy.  The patient has a history of preterm deliveries at 69 and 34 weeks. The patient has a history of a prior cone biopsy- had a 34 and 37 week delivery after her procedure.  She is currently on 17-P injections.  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  Recommendations: Recommend continued 17-P injections. Follow up cervical length in 1 week.  Feel that most of the cervical shortening noted is due to hx of prior cone biopsy; but cervical shortening more pronounced next week, would entertain cerclage.  Alpha Gula, MD

## 2012-04-15 ENCOUNTER — Ambulatory Visit (INDEPENDENT_AMBULATORY_CARE_PROVIDER_SITE_OTHER): Payer: Self-pay | Admitting: *Deleted

## 2012-04-15 ENCOUNTER — Inpatient Hospital Stay (HOSPITAL_COMMUNITY)
Admission: AD | Admit: 2012-04-15 | Discharge: 2012-04-15 | Disposition: A | Discharge status: Home / Self Care | Payer: Self-pay | Source: Ambulatory Visit | Attending: Obstetrics & Gynecology | Admitting: Obstetrics & Gynecology

## 2012-04-15 DIAGNOSIS — R109 Unspecified abdominal pain: Secondary | ICD-10-CM | POA: Insufficient documentation

## 2012-04-15 DIAGNOSIS — O2340 Unspecified infection of urinary tract in pregnancy, unspecified trimester: Secondary | ICD-10-CM

## 2012-04-15 DIAGNOSIS — O2390 Unspecified genitourinary tract infection in pregnancy, unspecified trimester: Secondary | ICD-10-CM

## 2012-04-15 DIAGNOSIS — N390 Urinary tract infection, site not specified: Secondary | ICD-10-CM

## 2012-04-15 DIAGNOSIS — O09219 Supervision of pregnancy with history of pre-term labor, unspecified trimester: Secondary | ICD-10-CM

## 2012-04-15 DIAGNOSIS — O26879 Cervical shortening, unspecified trimester: Secondary | ICD-10-CM

## 2012-04-15 LAB — URINE MICROSCOPIC-ADD ON

## 2012-04-15 LAB — URINALYSIS, ROUTINE W REFLEX MICROSCOPIC
Glucose, UA: NEGATIVE mg/dL
Protein, ur: NEGATIVE mg/dL
Specific Gravity, Urine: 1.025 (ref 1.005–1.030)

## 2012-04-15 MED ORDER — IBUPROFEN 600 MG PO TABS
600.0000 mg | ORAL_TABLET | Freq: Once | ORAL | Status: AC
  Administered 2012-04-15: 600 mg via ORAL
  Filled 2012-04-15: qty 1

## 2012-04-15 MED ORDER — DIPHENHYDRAMINE HCL 25 MG PO CAPS
25.0000 mg | ORAL_CAPSULE | Freq: Once | ORAL | Status: AC
  Administered 2012-04-15: 25 mg via ORAL
  Filled 2012-04-15: qty 1

## 2012-04-15 MED ORDER — SULFAMETHOXAZOLE-TRIMETHOPRIM 400-80 MG PO TABS: 1.0000 | ORAL_TABLET | Freq: Once | ORAL | Status: DC

## 2012-04-15 MED ORDER — CEFTRIAXONE SODIUM 250 MG IJ SOLR
250.0000 mg | Freq: Once | INTRAMUSCULAR | Status: AC
  Administered 2012-04-15: 250 mg via INTRAMUSCULAR
  Filled 2012-04-15: qty 250

## 2012-04-15 MED ORDER — HYDROXYPROGESTERONE CAPROATE 250 MG/ML IM OIL
250.0000 mg | TOPICAL_OIL | INTRAMUSCULAR | Status: DC
  Administered 2012-04-15: 250 mg via INTRAMUSCULAR

## 2012-04-15 NOTE — Progress Notes (Signed)
Patient is here today for 17-P injection and has been having increased cramping and pain for an hour now.  I have advised her to go to [facility] for evaluation due to her shortened cervix.

## 2012-04-15 NOTE — [facility] Note (Signed)
Patient is currently on 17P started cramping this morning around 11:00 was told to come to hospital for evaluation.

## 2012-04-15 NOTE — MAU Provider Note (Signed)
History     CSN: [id number]  Arrival date and time: 04/15/12 1445   First Provider Initiated Contact with Patient 04/15/12 1505      Chief Complaint  Patient presents with  . Abdominal Cramping   HPI This is a 31 y.o. female at [redacted]w[redacted]d who presents for evaluation of cramping.  She has been followed for short cervix (1.6cm) presumed related to CKC and LEEP.  Denies leaking or bleeding. Has been followed by MFM and has an appt Friday for [place].  Does report a history of interstitial cystitis  RN Note: Patient is currently on 17P started cramping this morning around 11:00 was told to come to hospital for evaluation.   OB History    Grav Para Term Preterm Abortions TAB SAB Ect Mult Living   4 3 1 2  0     3      Past Medical History  Diagnosis Date  . Migraine   . Insomnia   . Abnormal Pap smear 2004  . Cancer 2004    cervical  . Interstitial cystitis     Past Surgical History  Procedure Date  . Leep 2004  . Cone bx 2004  . Cystoscopy 2007  . Cesarean section 2005  . Hysteroscopy 2007    Family History  Problem Relation Age of Onset  . Hypertension Father   . Cancer Maternal Grandmother     lung/brain  . Cancer Paternal Grandfather     lung/prostate cancer    History  Substance Use Topics  . Smoking status: Current Every Day Smoker -- 0.2 packs/day for 15 years    Types: Cigarettes  . Smokeless tobacco: Never Used  . Alcohol Use: No     Comment: seldom    Allergies:  Allergies  Allergen Reactions  . Vancomycin Anaphylaxis  . Amoxicillin   . Clindamycin/Lincomycin   . Penicillins   . Sulfur   . Z-Pak (Azithromycin)     Prescriptions prior to admission  Medication Sig Dispense Refill  . Prenatal Multivit-Min-Fe-FA (PRENATAL VITAMINS) 0.8 MG tablet Take 1 tablet by mouth daily.  30 tablet  12  . promethazine (PHENERGAN) 25 MG tablet Take 1 tablet (25 mg total) by mouth every 6 (six) hours as needed for nausea.  30 tablet  2    ROS See  HPI  Physical Exam   Blood pressure 118/65, pulse 82, temperature 98.4 F (36.9 C), temperature source Oral, resp. rate 18, last menstrual period 11/18/2011.  Physical Exam  Constitutional: She is oriented to person, place, and time. She appears well-developed and well-nourished. No distress.  Cardiovascular: Normal rate.   Respiratory: Effort normal.  GI: Soft. She exhibits no distension and no mass. There is no tenderness. There is no rebound and no guarding.  Genitourinary: Vagina normal and uterus normal. No vaginal discharge found.       Dilation: Closed Effacement (%): 50 Exam by:: Wynelle Bourgeois CNM  Length c/w 1.6cm length  Musculoskeletal: Normal range of motion.  Neurological: She is alert and oriented to person, place, and time.  Skin: Skin is warm and dry.  Psychiatric: She has a normal mood and affect.    FHR 150s  Results for orders placed during the hospital encounter of 04/15/12 (from the past 24 hour(s))  URINALYSIS, ROUTINE W REFLEX MICROSCOPIC     Status: Abnormal   Collection Time   04/15/12  3:02 PM      Component Value Range   Color, Urine YELLOW  YELLOW   APPearance  CLEAR  CLEAR   Specific Gravity, Urine 1.025  1.005 - 1.030   pH 6.5  5.0 - 8.0   Glucose, UA NEGATIVE  NEGATIVE mg/dL   Hgb urine dipstick MODERATE (*) NEGATIVE   Bilirubin Urine NEGATIVE  NEGATIVE   Ketones, ur NEGATIVE  NEGATIVE mg/dL   Protein, ur NEGATIVE  NEGATIVE mg/dL   Urobilinogen, UA 2.0 (*) 0.0 - 1.0 mg/dL   Nitrite NEGATIVE  NEGATIVE   Leukocytes, UA SMALL (*) NEGATIVE  URINE MICROSCOPIC-ADD ON     Status: Abnormal   Collection Time   04/15/12  3:02 PM      Component Value Range   Squamous Epithelial / LPF FEW (*) RARE   RBC / HPF 11-20  <3 RBC/hpf   Bacteria, UA FEW (*) RARE   Urine-Other MUCOUS PRESENT      MAU Course  Procedures  Assessment and Plan  A:  SIUP at [redacted]w[redacted]d        UTI       Stable cervix       Cramping probably due to UTI  P:  Discussed with  Pharmacy (hx multiple antibiotic allergies       Rocephin given for one time dosing with Benadryl. Pt observed > no reaction       May use ibuprofen PRN for a few more weeks  [facility] 04/15/2012, 3:16 PM

## 2012-04-15 NOTE — MAU Provider Note (Signed)
Attestation of Attending Supervision of Advanced Practitioner (CNM/NP): Evaluation and management procedures were performed by the Advanced Practitioner under my supervision and collaboration.  I have reviewed the Advanced Practitioner's note and chart, and I agree with the management and plan.  Dara Beidleman, MD, FACOG Attending Obstetrician & Gynecologist Faculty Practice, [facility]  

## 2012-04-19 ENCOUNTER — Ambulatory Visit (HOSPITAL_COMMUNITY)
Admission: RE | Admit: 2012-04-19 | Discharge: 2012-04-19 | Disposition: A | Discharge status: Home / Self Care | Payer: Self-pay | Source: Ambulatory Visit | Attending: Obstetrics & Gynecology | Admitting: Obstetrics & Gynecology

## 2012-04-19 VITALS — BP 117/74 | HR 120 | Wt 119.0 lb

## 2012-04-19 DIAGNOSIS — O34219 Maternal care for unspecified type scar from previous cesarean delivery: Secondary | ICD-10-CM | POA: Insufficient documentation

## 2012-04-19 DIAGNOSIS — O3440 Maternal care for other abnormalities of cervix, unspecified trimester: Secondary | ICD-10-CM | POA: Insufficient documentation

## 2012-04-19 DIAGNOSIS — O26872 Cervical shortening, second trimester: Secondary | ICD-10-CM

## 2012-04-19 DIAGNOSIS — O26879 Cervical shortening, unspecified trimester: Secondary | ICD-10-CM | POA: Insufficient documentation

## 2012-04-19 DIAGNOSIS — O3500X0 Maternal care for (suspected) central nervous system malformation or damage in fetus, unspecified, not applicable or unspecified: Secondary | ICD-10-CM | POA: Insufficient documentation

## 2012-04-19 DIAGNOSIS — O350XX0 Maternal care for (suspected) central nervous system malformation in fetus, not applicable or unspecified: Secondary | ICD-10-CM | POA: Insufficient documentation

## 2012-04-19 DIAGNOSIS — Z8751 Personal history of pre-term labor: Secondary | ICD-10-CM | POA: Insufficient documentation

## 2012-04-19 NOTE — Progress Notes (Signed)
Natalie Duffy  was seen today for an ultrasound appointment.  See full report in [facility].  Comments: Natalie Duffy returns for follow up due to shortened cervix.  On previous ultrasound, mild ventriculomegaly and possible absent middle phalanx of the 5th digit was noted.  Cell free fetal DNA (Harmony test) was low risk for aneuploidy.  The patient has a history of preterm deliveries at 20 and 34 weeks. The patient has a history of a prior cone biopsy- had a 34 and 37 week delivery after her procedure.  She is currently on 17-P injections.  Impression: Single IUP at 21 0/7 weeks Lateral intracranial ventricles are normal today TVUS - cervical length of 2.6 cm with no apparent funneling  Recommendations: 1. Continue 17P weekly; 2. Follow up TVUS for cervical length in 2 weeks.   Merideth Abbey, MD, MS, FACOG Assistant Professor, Maternal-Fetal Medicine

## 2012-04-26 ENCOUNTER — Ambulatory Visit (HOSPITAL_COMMUNITY): Payer: Self-pay

## 2012-04-29 ENCOUNTER — Encounter: Payer: Self-pay | Admitting: Obstetrics & Gynecology

## 2012-05-03 ENCOUNTER — Other Ambulatory Visit (HOSPITAL_COMMUNITY): Payer: Self-pay | Admitting: Maternal and Fetal Medicine

## 2012-05-03 ENCOUNTER — Ambulatory Visit (HOSPITAL_COMMUNITY)
Admission: RE | Admit: 2012-05-03 | Discharge: 2012-05-03 | Disposition: A | Discharge status: Home / Self Care | Payer: Self-pay | Source: Ambulatory Visit | Attending: Obstetrics & Gynecology | Admitting: Obstetrics & Gynecology

## 2012-05-03 VITALS — BP 112/64 | HR 97 | Wt 118.5 lb

## 2012-05-03 DIAGNOSIS — O3440 Maternal care for other abnormalities of cervix, unspecified trimester: Secondary | ICD-10-CM | POA: Insufficient documentation

## 2012-05-03 DIAGNOSIS — O26879 Cervical shortening, unspecified trimester: Secondary | ICD-10-CM | POA: Insufficient documentation

## 2012-05-03 DIAGNOSIS — IMO0002 Reserved for concepts with insufficient information to code with codable children: Secondary | ICD-10-CM

## 2012-05-03 DIAGNOSIS — Z8751 Personal history of pre-term labor: Secondary | ICD-10-CM | POA: Insufficient documentation

## 2012-05-03 DIAGNOSIS — O26872 Cervical shortening, second trimester: Secondary | ICD-10-CM

## 2012-05-03 DIAGNOSIS — O34219 Maternal care for unspecified type scar from previous cesarean delivery: Secondary | ICD-10-CM | POA: Insufficient documentation

## 2012-05-03 NOTE — Progress Notes (Signed)
Ms. Maude Leriche was seen for ultrasound appointment today.  Please see [facility]OB[facility]N report for details.  Comments Ms. Whitlow returns for follow up due to shortened cervix.  In addition, the patient has a history of a prior cone biopsy and had a 34 and 37 week delivery after her procedure.  She is currently on 17-P injections.  Transvaginal cervical length remains stable at 2.1 cm.    Impression Single living intrauterine pregnancy at 23 weeks 0 days. Stable transvaginal cervical length (2.1 cm).

## 2012-05-16 ENCOUNTER — Inpatient Hospital Stay (HOSPITAL_COMMUNITY)
Admission: AD | Admit: 2012-05-16 | Discharge: 2012-05-17 | Disposition: A | Discharge status: Home / Self Care | Payer: Self-pay | Source: Ambulatory Visit | Attending: Obstetrics & Gynecology | Admitting: Obstetrics & Gynecology

## 2012-05-16 ENCOUNTER — Encounter (HOSPITAL_COMMUNITY): Payer: Self-pay | Admitting: *Deleted

## 2012-05-16 ENCOUNTER — Inpatient Hospital Stay (HOSPITAL_COMMUNITY): Payer: Self-pay

## 2012-05-16 DIAGNOSIS — O26872 Cervical shortening, second trimester: Secondary | ICD-10-CM

## 2012-05-16 DIAGNOSIS — O26879 Cervical shortening, unspecified trimester: Secondary | ICD-10-CM | POA: Insufficient documentation

## 2012-05-16 DIAGNOSIS — R109 Unspecified abdominal pain: Secondary | ICD-10-CM | POA: Insufficient documentation

## 2012-05-16 DIAGNOSIS — O4700 False labor before 37 completed weeks of gestation, unspecified trimester: Secondary | ICD-10-CM | POA: Insufficient documentation

## 2012-05-16 LAB — WET PREP, GENITAL: Clue Cells Wet Prep HPF POC: NONE SEEN

## 2012-05-16 MED ORDER — METOCLOPRAMIDE HCL 10 MG PO TABS
10.0000 mg | ORAL_TABLET | Freq: Once | ORAL | Status: AC
  Administered 2012-05-16: 10 mg via ORAL
  Filled 2012-05-16: qty 1

## 2012-05-16 MED ORDER — DIPHENHYDRAMINE HCL 25 MG PO CAPS
25.0000 mg | ORAL_CAPSULE | Freq: Once | ORAL | Status: AC
  Administered 2012-05-16: 25 mg via ORAL
  Filled 2012-05-16: qty 1

## 2012-05-16 MED ORDER — NIFEDIPINE 10 MG PO CAPS
10.0000 mg | ORAL_CAPSULE | Freq: Once | ORAL | Status: AC
  Administered 2012-05-16: 10 mg via ORAL
  Filled 2012-05-16 (×2): qty 1

## 2012-05-16 MED ORDER — BUTALBITAL-APAP-CAFFEINE 50-325-40 MG PO TABS
2.0000 | ORAL_TABLET | Freq: Once | ORAL | Status: AC
  Administered 2012-05-16: 2 via ORAL
  Filled 2012-05-16: qty 2

## 2012-05-16 MED ORDER — DIPHENHYDRAMINE HCL 12.5 MG/5ML PO ELIX: 25.0000 mg | ORAL_SOLUTION | Freq: Once | ORAL | Status: DC

## 2012-05-16 MED ORDER — TERBUTALINE SULFATE 1 MG/ML IJ SOLN
0.2500 mg | Freq: Once | INTRAMUSCULAR | Status: AC
  Administered 2012-05-16: 0.25 mg via SUBCUTANEOUS
  Filled 2012-05-16 (×2): qty 1

## 2012-05-16 MED ORDER — NIFEDIPINE 10 MG PO CAPS
10.0000 mg | ORAL_CAPSULE | Freq: Once | ORAL | Status: AC
  Administered 2012-05-16: 10 mg via ORAL
  Filled 2012-05-16: qty 1

## 2012-05-16 NOTE — MAU Provider Note (Signed)
History     CSN: [id number]  Arrival date and time: 05/16/12 1530   None     Chief Complaint  Patient presents with  . Abdominal Pain   HPI [name] is a 31yo [id number] with [redacted]w[redacted]d who presents with abdominal pain. Pt reports abdominal pain since this morning, first she thought that she had a bladder infection.  She denies vaginal discharge, vaginal bleeding. Good fetal movement. Pt has history of pre term deliveries. And Short cervix.  Past Medical History  Diagnosis Date  . Migraine   . Insomnia   . Abnormal Pap smear 2004  . Cancer 2004    cervical  . Interstitial cystitis     Past Surgical History  Procedure Date  . Leep 2004  . Cone bx 2004  . Cystoscopy 2007  . Cesarean section 2005  . Hysteroscopy 2007    Family History  Problem Relation Age of Onset  . Hypertension Father   . Cancer Maternal Grandmother     lung/brain  . Cancer Paternal Grandfather     lung/prostate cancer    History  Substance Use Topics  . Smoking status: Former Smoker -- 0.2 packs/day for 15 years    Types: Cigarettes    Quit date: 01/15/2012  . Smokeless tobacco: Never Used  . Alcohol Use: No     Comment: seldom    Allergies:  Allergies  Allergen Reactions  . Vancomycin Anaphylaxis  . Sulfur     Reaction unknown  . Z-Pak (Azithromycin)     Reaction unknown   . Amoxicillin Rash  . Clindamycin/Lincomycin Rash  . Penicillins Rash    Prescriptions prior to admission  Medication Sig Dispense Refill  . acetaminophen (TYLENOL) 500 MG tablet Take 500 mg by mouth every 6 (six) hours as needed. pain      . Prenatal Vit-Fe Fumarate-FA (PRENATAL MULTIVITAMIN) TABS Take 1 tablet by mouth daily.      . promethazine (PHENERGAN) 25 MG tablet Take 1 tablet (25 mg total) by mouth every 6 (six) hours as needed for nausea.  30 tablet  2    Review of Systems  Constitutional: Negative for fever.  Genitourinary: Negative for dysuria.  Neurological: Negative for headaches.    Physical Exam   Blood pressure 118/66, pulse 89, temperature 98 F (36.7 C), temperature source Oral, resp. rate 16, height 5\' 3"  (1.6 m), weight 54.613 kg (120 lb 6.4 oz), last menstrual period 11/18/2011, SpO2 100.00%.  Physical Exam  Constitutional: She appears well-developed.  HENT:  Head: Normocephalic.  Neck: Normal range of motion.  Specular exam: cervix normal, cottage cheese like secretion in vaginal wall. Fibronectin collected. Dilation: Closed Effacement (%): Thick Cervical Position: Posterior Station: Ballotable Presentation: Undeterminable Exam by:: Dr. Chelsea Aus FHT: baseline 130, moderate variability, accels present, no decels Toco: Contractions regular Q6-16min  MAU Course  Procedures   MDM [place] Fibronectin Wet prep Procardia Assessment and Plan  [name] is a 31yo [id number] with [redacted]w[redacted]d who presents with abdominal pain. -Contractions, pre term labor. -Fibronectin negative - transfer care to [facility], Viviane E 05/16/2012, 4:43 PM   UCs persist despite two doses of nifedipine. Patient c/o flushing after nifedipine Given Benadryl by Dr Chelsea Aus. Started having headache >> given fioricet.  FFn is negative US shows Cervical Length of 1.6cm (stable)  Consulted Dr Erin Fulling:  May D/C home and have her followup with MFM as scheduled tomorrow.   Patient now states she wants a shot of Terbutaline because the pain in  her back with UCs will keep her up tonight.  Will try it to see if it helps.  Then D/C home

## 2012-05-16 NOTE — MAU Note (Signed)
Pt states she started having pain about 0900. Pt states she thought she had a UTI but that was ruled out in the office

## 2012-05-16 NOTE — MAU Note (Signed)
Patient states she has been having lower abdominal pain since 0900 that is getting worse. Was seen at [facility] today and ruled out any type of infection. Denies any bleeding or leaking and reports good fetal movement.

## 2012-05-16 NOTE — MAU Provider Note (Signed)
Pt observed for several hours >6hours with  No change in clinical status.  Exam unchanged from prior exams.  Pt has a neg fFn and she has a f/u appt in the am with MFM.  Pt given PTL precautions  If sx change prior to appt in am.  Eber Jones L. Harraway-Smith, M.D., [place]

## 2012-05-17 ENCOUNTER — Ambulatory Visit (HOSPITAL_COMMUNITY): Admission: RE | Admit: 2012-05-17 | Payer: Self-pay | Source: Ambulatory Visit

## 2012-05-22 NOTE — L&D Delivery Note (Signed)
Delivery Note At 9:57 PM a healthy female was delivered via Vaginal, Spontaneous Delivery (Presentation: ; Occiput Anterior).  APGAR: 9, 9; weight 5 lb 8.4 oz (2505 g).   Placenta status: Intact, Spontaneous.  Cord: 3 vessels with the following complications: None.  Cord pH: n/a  Anesthesia: None  Episiotomy: None Lacerations: None Suture Repair: n/a Est. Blood Loss (mL): 300  Mom to postpartum.  Baby to nursery-stable.  Kevin Fenton 08/13/2012, 11:53 PM

## 2012-05-23 ENCOUNTER — Ambulatory Visit: Payer: Self-pay | Admitting: *Deleted

## 2012-05-23 MED ORDER — FLUCONAZOLE 150 MG PO TABS: 150.0000 mg | ORAL_TABLET | Freq: Once | ORAL | Status: DC

## 2012-05-27 ENCOUNTER — Ambulatory Visit (INDEPENDENT_AMBULATORY_CARE_PROVIDER_SITE_OTHER): Payer: Self-pay | Admitting: *Deleted

## 2012-05-27 DIAGNOSIS — O09219 Supervision of pregnancy with history of pre-term labor, unspecified trimester: Secondary | ICD-10-CM

## 2012-05-27 MED ORDER — HYDROXYPROGESTERONE CAPROATE 250 MG/ML IM OIL
250.0000 mg | TOPICAL_OIL | INTRAMUSCULAR | Status: DC
  Administered 2012-05-27: 250 mg via INTRAMUSCULAR

## 2012-05-29 ENCOUNTER — Encounter: Payer: Self-pay | Admitting: Obstetrics & Gynecology

## 2012-05-31 ENCOUNTER — Other Ambulatory Visit (HOSPITAL_COMMUNITY): Payer: Self-pay | Admitting: Maternal and Fetal Medicine

## 2012-05-31 ENCOUNTER — Encounter: Payer: Self-pay | Admitting: Obstetrics & Gynecology

## 2012-05-31 ENCOUNTER — Ambulatory Visit (HOSPITAL_COMMUNITY)
Admission: RE | Admit: 2012-05-31 | Discharge: 2012-05-31 | Disposition: A | Discharge status: Home / Self Care | Payer: Medicaid Other | Source: Ambulatory Visit | Attending: Obstetrics & Gynecology | Admitting: Obstetrics & Gynecology

## 2012-05-31 DIAGNOSIS — O3440 Maternal care for other abnormalities of cervix, unspecified trimester: Secondary | ICD-10-CM | POA: Insufficient documentation

## 2012-05-31 DIAGNOSIS — O34219 Maternal care for unspecified type scar from previous cesarean delivery: Secondary | ICD-10-CM | POA: Insufficient documentation

## 2012-05-31 DIAGNOSIS — O26879 Cervical shortening, unspecified trimester: Secondary | ICD-10-CM | POA: Insufficient documentation

## 2012-05-31 DIAGNOSIS — O3500X0 Maternal care for (suspected) central nervous system malformation or damage in fetus, unspecified, not applicable or unspecified: Secondary | ICD-10-CM | POA: Insufficient documentation

## 2012-05-31 DIAGNOSIS — O26872 Cervical shortening, second trimester: Secondary | ICD-10-CM

## 2012-05-31 DIAGNOSIS — O350XX0 Maternal care for (suspected) central nervous system malformation in fetus, not applicable or unspecified: Secondary | ICD-10-CM | POA: Insufficient documentation

## 2012-05-31 DIAGNOSIS — Z8751 Personal history of pre-term labor: Secondary | ICD-10-CM | POA: Insufficient documentation

## 2012-05-31 MED ORDER — BETAMETHASONE SOD PHOS & ACET 6 (3-3) MG/ML IJ SUSP
12.0000 mg | Freq: Every day | INTRAMUSCULAR | Status: DC
  Administered 2012-05-31: 12 mg via INTRAMUSCULAR
  Filled 2012-05-31: qty 2

## 2012-05-31 NOTE — Progress Notes (Signed)
Ms. Natalie Duffy was seen for ultrasound appointment today.  Please see [facility]OB[facility] report for details.  Comments Natalie Duffy returns for follow up due to shortened cervix.  In addition, the patient has a history of a prior cone biopsy and had a 34 and 37 week delivery after her procedure.  She is currently on 17-P injections.  Transvaginal cervical length today is 1.5 cm, which is decreased from her last measurement of 2.1 cm.  Ms. Natalie Duffy was recently hospitalized due to an increase in uterine activity.  Given the decrease in cervical length I recommend she be given a course of betamethasone.  The first dose was administered in the [facility] today.  She will present to the MAU tomorrow to receive her second dose.  We will repeat a cervical length measurement in one week.  Impression Single living intrauterine pregnancy at 27 weeks 0 days. Appropriate interval fetal growth (45%). Normal amniotic fluid volume. Normal interval fetal anatomy.  Transvaginal cervical length (1.5 cm).

## 2012-05-31 NOTE — ED Notes (Signed)
Patient denies any problems. Having occasional ucs.

## 2012-06-03 ENCOUNTER — Encounter: Payer: Self-pay | Admitting: Obstetrics & Gynecology

## 2012-06-03 ENCOUNTER — Ambulatory Visit (INDEPENDENT_AMBULATORY_CARE_PROVIDER_SITE_OTHER): Payer: Self-pay | Admitting: Obstetrics & Gynecology

## 2012-06-03 VITALS — BP 129/74 | Wt 124.0 lb

## 2012-06-03 DIAGNOSIS — O26879 Cervical shortening, unspecified trimester: Secondary | ICD-10-CM

## 2012-06-03 DIAGNOSIS — O26872 Cervical shortening, second trimester: Secondary | ICD-10-CM

## 2012-06-03 DIAGNOSIS — O09219 Supervision of pregnancy with history of pre-term labor, unspecified trimester: Secondary | ICD-10-CM

## 2012-06-03 DIAGNOSIS — Z23 Encounter for immunization: Secondary | ICD-10-CM

## 2012-06-03 DIAGNOSIS — O09899 Supervision of other high risk pregnancies, unspecified trimester: Secondary | ICD-10-CM

## 2012-06-03 LAB — CBC
HCT: 28.9 % — ABNORMAL LOW (ref 36.0–46.0)
Hemoglobin: 10 g/dL — ABNORMAL LOW (ref 12.0–15.0)
MCHC: 34.6 g/dL (ref 30.0–36.0)

## 2012-06-03 MED ORDER — HYDROXYPROGESTERONE CAPROATE 250 MG/ML IM OIL
250.0000 mg | TOPICAL_OIL | Freq: Once | INTRAMUSCULAR | Status: AC
  Administered 2012-06-03: 250 mg via INTRAMUSCULAR

## 2012-06-03 NOTE — Progress Notes (Signed)
Routine visit. Cervical length 1.5 cm on 05-31-12. She got 1 doses of BMZ following that u/s. 17 P today and weekly. Rec modified BR and strict pelvic rest. Flu vaccine and TDAP today. She will go to MFM this Friday for a repeat u/s and will mention to them that she did not get her second dose of BMZ as instructed (flat tire). She wants a PPS so she will sign her MCD forms today.

## 2012-06-04 ENCOUNTER — Encounter: Payer: Self-pay | Admitting: Obstetrics & Gynecology

## 2012-06-04 LAB — GLUCOSE TOLERANCE, 1 HOUR (50G) W/O FASTING: Glucose, 1 Hour GTT: 130 mg/dL (ref 70–140)

## 2012-06-05 ENCOUNTER — Encounter: Payer: Self-pay | Admitting: Obstetrics and Gynecology

## 2012-06-05 ENCOUNTER — Ambulatory Visit (INDEPENDENT_AMBULATORY_CARE_PROVIDER_SITE_OTHER): Payer: Self-pay | Admitting: Obstetrics and Gynecology

## 2012-06-05 VITALS — BP 117/80 | Wt 122.0 lb

## 2012-06-05 DIAGNOSIS — O09219 Supervision of pregnancy with history of pre-term labor, unspecified trimester: Secondary | ICD-10-CM

## 2012-06-05 DIAGNOSIS — Z3480 Encounter for supervision of other normal pregnancy, unspecified trimester: Secondary | ICD-10-CM

## 2012-06-05 DIAGNOSIS — O26879 Cervical shortening, unspecified trimester: Secondary | ICD-10-CM

## 2012-06-05 DIAGNOSIS — O34219 Maternal care for unspecified type scar from previous cesarean delivery: Secondary | ICD-10-CM

## 2012-06-05 DIAGNOSIS — O09299 Supervision of pregnancy with other poor reproductive or obstetric history, unspecified trimester: Secondary | ICD-10-CM

## 2012-06-05 DIAGNOSIS — O26872 Cervical shortening, second trimester: Secondary | ICD-10-CM

## 2012-06-05 DIAGNOSIS — Z9889 Other specified postprocedural states: Secondary | ICD-10-CM

## 2012-06-05 MED ORDER — ZOLPIDEM TARTRATE 10 MG PO TABS: 5.0000 mg | ORAL_TABLET | Freq: Every evening | ORAL | Status: DC | PRN

## 2012-06-05 NOTE — Progress Notes (Signed)
Patient presenting today complaining of feeling stressed and unable to sleep. She also has migraine headaches relieved with Imitrex. Patient states the stress is due to her being put out of work and not having enough money to pay bills. Patient feels that she needs to take her Ativan but understands that it is a category D drug and is requesting something else. Offered ambien to aid with sleep which patient took gladly. Continue weekly 17-P and keep scheduled appointment.

## 2012-06-06 ENCOUNTER — Other Ambulatory Visit (HOSPITAL_COMMUNITY): Payer: Self-pay | Admitting: Maternal and Fetal Medicine

## 2012-06-06 DIAGNOSIS — O26879 Cervical shortening, unspecified trimester: Secondary | ICD-10-CM

## 2012-06-07 ENCOUNTER — Ambulatory Visit (HOSPITAL_COMMUNITY)
Admission: RE | Admit: 2012-06-07 | Discharge: 2012-06-07 | Disposition: A | Discharge status: Home / Self Care | Payer: Medicaid Other | Source: Ambulatory Visit | Attending: Obstetrics & Gynecology | Admitting: Obstetrics & Gynecology

## 2012-06-07 VITALS — BP 113/81 | HR 87 | Wt 121.0 lb

## 2012-06-07 DIAGNOSIS — O26872 Cervical shortening, second trimester: Secondary | ICD-10-CM

## 2012-06-07 DIAGNOSIS — O350XX0 Maternal care for (suspected) central nervous system malformation in fetus, not applicable or unspecified: Secondary | ICD-10-CM | POA: Insufficient documentation

## 2012-06-07 DIAGNOSIS — O3440 Maternal care for other abnormalities of cervix, unspecified trimester: Secondary | ICD-10-CM | POA: Insufficient documentation

## 2012-06-07 DIAGNOSIS — O3500X0 Maternal care for (suspected) central nervous system malformation or damage in fetus, unspecified, not applicable or unspecified: Secondary | ICD-10-CM | POA: Insufficient documentation

## 2012-06-07 DIAGNOSIS — O26879 Cervical shortening, unspecified trimester: Secondary | ICD-10-CM | POA: Insufficient documentation

## 2012-06-07 DIAGNOSIS — O34219 Maternal care for unspecified type scar from previous cesarean delivery: Secondary | ICD-10-CM | POA: Insufficient documentation

## 2012-06-09 ENCOUNTER — Inpatient Hospital Stay (HOSPITAL_COMMUNITY): Payer: Medicaid Other

## 2012-06-09 ENCOUNTER — Encounter (HOSPITAL_COMMUNITY): Payer: Self-pay | Admitting: Obstetrics and Gynecology

## 2012-06-09 ENCOUNTER — Inpatient Hospital Stay (HOSPITAL_COMMUNITY)
Admission: AD | Admit: 2012-06-09 | Discharge: 2012-06-09 | Disposition: A | Discharge status: Home / Self Care | Payer: Medicaid Other | Source: Ambulatory Visit | Attending: Obstetrics and Gynecology | Admitting: Obstetrics and Gynecology

## 2012-06-09 DIAGNOSIS — F410 Panic disorder [episodic paroxysmal anxiety] without agoraphobia: Secondary | ICD-10-CM

## 2012-06-09 DIAGNOSIS — O26872 Cervical shortening, second trimester: Secondary | ICD-10-CM

## 2012-06-09 DIAGNOSIS — O26879 Cervical shortening, unspecified trimester: Secondary | ICD-10-CM

## 2012-06-09 DIAGNOSIS — O99891 Other specified diseases and conditions complicating pregnancy: Secondary | ICD-10-CM | POA: Insufficient documentation

## 2012-06-09 DIAGNOSIS — R079 Chest pain, unspecified: Secondary | ICD-10-CM | POA: Insufficient documentation

## 2012-06-09 LAB — URINALYSIS, ROUTINE W REFLEX MICROSCOPIC
Bilirubin Urine: NEGATIVE
Glucose, UA: NEGATIVE mg/dL
Ketones, ur: NEGATIVE mg/dL
Leukocytes, UA: NEGATIVE
Nitrite: NEGATIVE
Specific Gravity, Urine: 1.015 (ref 1.005–1.030)
pH: 7 (ref 5.0–8.0)

## 2012-06-09 MED ORDER — GI COCKTAIL ~~LOC~~
30.0000 mL | Freq: Once | ORAL | Status: AC
  Administered 2012-06-09: 30 mL via ORAL
  Filled 2012-06-09: qty 30

## 2012-06-09 NOTE — MAU Note (Addendum)
Pt reports has been having chest pain on and off all week. Given abien for sleep. Stated she woke upa round 200 am this morning with mid sternal chest paintha t has not gone away [place] constant and worse with deep breath in. States she feels hot  And feels like her heart is racing at times.

## 2012-06-09 NOTE — MAU Provider Note (Signed)
Chief Complaint:  Chest Pain   First Provider Initiated Contact with Patient 06/09/12 1445      HPI: Natalie Duffy is a 32 y.o. [id number] at [redacted]w[redacted]d who presents to maternity admissions reporting waking from sleep at 2 AM with constant substernal chest pain. She describes it as an elephant sitting on her chest. The pain is nonradiating and constant but she feels it more when she has a contraction. Feels like she can't get a full breath. Denies dizziness, diaphoresis, nausea vomiting, epigastric pain, weakness, visual changes, palpitations. She does have a history of palpitations and has had a EKG and what sounds like Holter monitor in the past that she says for normal she says that she occasionally feels her heart skip a beat but that this is been ongoing since long before the pregnancy. Denies any perceived skipped beats or heart racing with this epidode. States that the pain is not like any food intolerance, heartburn or reflux symptoms she has had in the past. No previous episodes of CP like this, does she has experienced chest tightness when she's had panic attacks in the past (just "not this bad"). Had been on antidepressant prior to pregnancy. Denies contractions any different than what she usually feels, leakage of fluid or vaginal bleeding. Good fetal movement.   Pregnancy Course: on weekly 17 P. for short cervix with history of preterm births. (cx 1.4 cm long 2 d ago) Previous C-section and VBAC. Prenatal care at [facility].  Past Medical History: Past Medical History  Diagnosis Date  . Migraine   . Insomnia   . Abnormal Pap smear 2004  . Cancer 2004    cervical  . Interstitial cystitis     Past obstetric history: OB History    Grav Para Term Preterm Abortions TAB SAB Ect Mult Living   5 3 1 2 1  1   3      # Outc Date GA Lbr Len/2nd Wgt Sex Del Anes PTL Lv   1 PRE 7/03 [redacted]w[redacted]d  4lb7oz(2.013kg) M SVD None Yes Yes   2 SAB 2007 [redacted]w[redacted]d          Comments: "the baby was growing outside of  the sac and not living."   3 PRE 5/08 103w0d  4lb13oz(2.183kg) M LTCS EPI Yes Yes   4 TRM 8/10 [redacted]w[redacted]d  5lb(2.268kg) M SVD EPI Yes Yes   5 CUR               Past Surgical History: Past Surgical History  Procedure Date  . Leep 2004  . Cone bx 2004  . Cystoscopy 2007  . Cesarean section 2005  . Hysteroscopy 2007    Family History: Family History  Problem Relation Age of Onset  . Hypertension Father   . Cancer Maternal Grandmother     lung/brain  . Cancer Paternal Grandfather     lung/prostate cancer    Social History: History  Substance Use Topics  . Smoking status: Former Smoker -- 0.2 packs/day for 15 years    Types: Cigarettes    Quit date: 01/15/2012  . Smokeless tobacco: Never Used  . Alcohol Use: No     Comment: seldom    Allergies:  Allergies  Allergen Reactions  . Vancomycin Anaphylaxis  . Procardia (Nifedipine) Swelling  . Sulfur     Reaction unknown  . Z-Pak (Azithromycin)     Reaction unknown   . Amoxicillin Rash  . Clindamycin/Lincomycin Rash  . Penicillins Rash    Meds:  Prescriptions  prior to admission  Medication Sig Dispense Refill  . acetaminophen (TYLENOL) 500 MG tablet Take 500 mg by mouth every 6 (six) hours as needed. pain      . Prenatal Vit-Fe Fumarate-FA (PRENATAL MULTIVITAMIN) TABS Take 1 tablet by mouth daily.      . promethazine (PHENERGAN) 25 MG tablet Take 1 tablet (25 mg total) by mouth every 6 (six) hours as needed for nausea.  30 tablet  2  . zolpidem (AMBIEN) 10 MG tablet Take 0.5 tablets (5 mg total) by mouth at bedtime as needed for sleep.  30 tablet  0  . fluconazole (DIFLUCAN) 150 MG tablet Take 1 tablet (150 mg total) by mouth once. Repeat in 2 days if symptoms persist.  2 tablet  0    ROS: Pertinent findings in history of present illness.  Physical Exam  Blood pressure 126/69, pulse 110, temperature 97.8 F (36.6 C), temperature source Oral, resp. rate 18, height 5\' 3"  (1.6 m), weight 54.613 kg (120 lb 6.4 oz), last  menstrual period 11/18/2011, SpO2 99.00%. GENERAL: Well-developed, well-nourished female in no acute distress.  HEENT: normocephalic HEART: RRR, soft G1 sytolic murmur RESP: normal effort, CTA bilat Chest: slightly tender to sternal pressure ABDOMEN: Soft, non-tender, gravid appropriate for gestational age EXTREMITIES: Nontender, no edema NEURO: alert and oriented PSYCH: normal affect, eye contact, communication, mood, judgment  Dilation: Closed Effacement (%): 50 Station: -2 Exam by:: D Utah Delauder CNM LUS developed, vtx  FHT:  Baseline 135 , moderate variability, accelerations present, no decelerations Contractions: q 2-8 mins, mild> diminished to occ   Labs: Results for orders placed during the hospital encounter of 06/09/12 (from the past 24 hour(s))  URINALYSIS, ROUTINE W REFLEX MICROSCOPIC     Status: Abnormal   Collection Time   06/09/12  1:40 PM      Component Value Range   Color, Urine YELLOW  YELLOW   APPearance HAZY (*) CLEAR   Specific Gravity, Urine 1.015  1.005 - 1.030   pH 7.0  5.0 - 8.0   Glucose, UA NEGATIVE  NEGATIVE mg/dL   Hgb urine dipstick NEGATIVE  NEGATIVE   Bilirubin Urine NEGATIVE  NEGATIVE   Ketones, ur NEGATIVE  NEGATIVE mg/dL   Protein, ur NEGATIVE  NEGATIVE mg/dL   Urobilinogen, UA 1.0  0.0 - 1.0 mg/dL   Nitrite NEGATIVE  NEGATIVE   Leukocytes, UA NEGATIVE  NEGATIVE  FETAL FIBRONECTIN     Status: Normal   Collection Time   06/09/12  2:59 PM      Component Value Range   Fetal Fibronectin NEGATIVE  NEGATIVE  Fax: [phone] Imaging:  US Ob Follow Up 05/16/2012  *RADIOLOGY REPORT*  Clinical Data: Contractions, evaluate cervical length  LIMITED OBSTETRIC ULTRASOUND  Number of Fetuses: 1 Heart Rate: 146 bpm  MATERNAL FINDINGS: Cervix: Shortened, measuring 1.6 cm  IMPRESSION:  Cervix measures 1.6 cm in length.   Original Report Authenticated By: Charline Bills, M.D.    MAU Course: fFN sent>neg GI cocktail given> no improvement ECG>NSR, rate 91 Per  Dr. Emelda Fear get CXR and if normal discharge with office F/U tomorrow. CXR normal Assessment: 1. Cervical shortening in second trimester   Chest discomfort, probably due to panic attack  Plan: C/W Dr. Emelda Fear Discharge home See AVS Take Ambien if needed tonight, tylenol for pain Follow-up Information    Follow up with [facility]. In 1 day.   Contact information:   [address] [place] [address]  [phone]          Medication List     As of 06/09/2012  6:46 PM    TAKE these medications         acetaminophen 500 MG tablet   Commonly known as: TYLENOL   Take 500 mg by mouth every 6 (six) hours as needed. pain      fluconazole 150 MG tablet   Commonly known as: DIFLUCAN   Take 1 tablet (150 mg total) by mouth once. Repeat in 2 days if symptoms persist.      prenatal multivitamin Tabs   Take 1 tablet by mouth daily.      promethazine 25 MG tablet   Commonly known as: PHENERGAN   Take 1 tablet (25 mg total) by mouth every 6 (six) hours as needed for nausea.      zolpidem 10 MG tablet   Commonly known as: AMBIEN   Take 0.5 tablets (5 mg total) by mouth at bedtime as needed for sleep.         Danae Orleans, CNM 06/09/2012 3:00 PM

## 2012-06-09 NOTE — MAU Note (Signed)
"  I have had chest pain since 0200 this morning.  I feel like an elephant is sitting on my chest.  I am unable to take a deep breath.  I can feel my stomach tightening and it causes the pain in my chest to increase.  The pain is causing me to break out in sweats and I don't usually sweat.  (+) FM.  No VB or LOF."

## 2012-06-10 ENCOUNTER — Ambulatory Visit: Payer: Self-pay

## 2012-06-10 ENCOUNTER — Ambulatory Visit (INDEPENDENT_AMBULATORY_CARE_PROVIDER_SITE_OTHER): Payer: Self-pay | Admitting: Obstetrics and Gynecology

## 2012-06-10 VITALS — BP 102/78 | Wt 118.0 lb

## 2012-06-10 DIAGNOSIS — O09219 Supervision of pregnancy with history of pre-term labor, unspecified trimester: Secondary | ICD-10-CM

## 2012-06-10 DIAGNOSIS — F411 Generalized anxiety disorder: Secondary | ICD-10-CM

## 2012-06-10 DIAGNOSIS — O34219 Maternal care for unspecified type scar from previous cesarean delivery: Secondary | ICD-10-CM

## 2012-06-10 DIAGNOSIS — Z3480 Encounter for supervision of other normal pregnancy, unspecified trimester: Secondary | ICD-10-CM

## 2012-06-10 DIAGNOSIS — O26872 Cervical shortening, second trimester: Secondary | ICD-10-CM

## 2012-06-10 DIAGNOSIS — O09299 Supervision of pregnancy with other poor reproductive or obstetric history, unspecified trimester: Secondary | ICD-10-CM

## 2012-06-10 DIAGNOSIS — O26879 Cervical shortening, unspecified trimester: Secondary | ICD-10-CM

## 2012-06-10 DIAGNOSIS — F419 Anxiety disorder, unspecified: Secondary | ICD-10-CM

## 2012-06-10 MED ORDER — HYDROXYPROGESTERONE CAPROATE 250 MG/ML IM OIL
250.0000 mg | TOPICAL_OIL | Freq: Once | INTRAMUSCULAR | Status: AC
  Administered 2012-06-10: 250 mg via INTRAMUSCULAR

## 2012-06-10 MED ORDER — SERTRALINE HCL 25 MG PO TABS: ORAL_TABLET | ORAL | Status: DC

## 2012-06-10 NOTE — Addendum Note (Signed)
Addended by: Barbara Cower on: 06/10/2012 02:09 PM   Modules accepted: Orders

## 2012-06-10 NOTE — Progress Notes (Signed)
Patient presenting today as MAU follow-up for anxiety and weekly 17-P. Patient with persistent symptoms of chest pain and inability to sleep. Patient without any obstetrical complaints. Will start Zoloft and refer to behavioral health

## 2012-06-10 NOTE — MAU Provider Note (Signed)
Attestation of Attending Supervision of Advanced Practitioner: Evaluation and management procedures were performed by the PA/NP/CNM/OB Fellow under my supervision/collaboration. Chart reviewed and agree with management and plan.  Mearl Olver V 06/10/2012 10:38 PM

## 2012-06-11 ENCOUNTER — Telehealth: Payer: Self-pay | Admitting: *Deleted

## 2012-06-11 DIAGNOSIS — G4700 Insomnia, unspecified: Secondary | ICD-10-CM

## 2012-06-11 MED ORDER — ZOLPIDEM TARTRATE 10 MG PO TABS: 10.0000 mg | ORAL_TABLET | Freq: Every evening | ORAL | Status: DC | PRN

## 2012-06-11 NOTE — Telephone Encounter (Signed)
Patient is needing [place] to call the pharmacy regarding her Ambien rx.  She only picked up original rx 5 tablets to be sure it was going to work.  She went to get the remaining and they said they needed it changed to 10mg  qhs.  I called in the change for her and she will pick it up this afternoon.

## 2012-06-17 ENCOUNTER — Ambulatory Visit (INDEPENDENT_AMBULATORY_CARE_PROVIDER_SITE_OTHER): Payer: Self-pay | Admitting: *Deleted

## 2012-06-17 DIAGNOSIS — O09219 Supervision of pregnancy with history of pre-term labor, unspecified trimester: Secondary | ICD-10-CM

## 2012-06-17 DIAGNOSIS — O26899 Other specified pregnancy related conditions, unspecified trimester: Secondary | ICD-10-CM

## 2012-06-17 DIAGNOSIS — R300 Dysuria: Secondary | ICD-10-CM

## 2012-06-17 LAB — POCT URINALYSIS DIPSTICK
Bilirubin, UA: NEGATIVE
Blood, UA: NEGATIVE
Glucose, UA: NEGATIVE
Ketones, UA: NEGATIVE
Spec Grav, UA: >=1.03
pH, UA: 6.5

## 2012-06-17 MED ORDER — HYDROXYPROGESTERONE CAPROATE 250 MG/ML IM OIL
250.0000 mg | TOPICAL_OIL | Freq: Once | INTRAMUSCULAR | Status: AC
  Administered 2012-06-17: 250 mg via INTRAMUSCULAR

## 2012-06-17 NOTE — Progress Notes (Signed)
Patient is having some increase in cramps, she would like [place] to check her for UTI.  Urine dip is negative but due to history we will send sample for culture to be sure.  She also is having trouble taking the Zoloft, within an hour of taking the medication she vomits and can sometimes even see the pill in the vomit.  Today she will go home and rest and increase her fluid intake, adding a sports drink to assist in making sure she is well hydrated due the fact she has been vomiting.  She will follow up if the pain does persist.

## 2012-06-17 NOTE — Patient Instructions (Signed)
Due to preterm contractions and history of preterm delivery patient has been advised to be on bedrest until after delivery.

## 2012-06-24 ENCOUNTER — Ambulatory Visit (INDEPENDENT_AMBULATORY_CARE_PROVIDER_SITE_OTHER): Payer: Self-pay | Admitting: Family Medicine

## 2012-06-24 VITALS — BP 99/71 | Wt 125.0 lb

## 2012-06-24 DIAGNOSIS — O09219 Supervision of pregnancy with history of pre-term labor, unspecified trimester: Secondary | ICD-10-CM

## 2012-06-24 DIAGNOSIS — O09899 Supervision of other high risk pregnancies, unspecified trimester: Secondary | ICD-10-CM

## 2012-06-24 DIAGNOSIS — Z3480 Encounter for supervision of other normal pregnancy, unspecified trimester: Secondary | ICD-10-CM

## 2012-06-24 DIAGNOSIS — F419 Anxiety disorder, unspecified: Secondary | ICD-10-CM

## 2012-06-24 DIAGNOSIS — F411 Generalized anxiety disorder: Secondary | ICD-10-CM

## 2012-06-24 MED ORDER — HYDROXYPROGESTERONE CAPROATE 250 MG/ML IM OIL
250.0000 mg | TOPICAL_OIL | Freq: Once | INTRAMUSCULAR | Status: AC
  Administered 2012-06-24: 250 mg via INTRAMUSCULAR

## 2012-06-24 NOTE — Progress Notes (Signed)
Measuring well---s=d To therapist Side fx from Zoloft made her not continue this medication.

## 2012-06-24 NOTE — Patient Instructions (Addendum)
Anxiety and Panic Attacks Your caregiver has informed you that you are having an anxiety or panic attack. There may be many forms of this. Most of the time these attacks come suddenly and without warning. They come at any time of day, including periods of sleep, and at any time of life. They may be strong and unexplained. Although panic attacks are very scary, they are physically harmless. Sometimes the cause of your anxiety is not known. Anxiety is a protective mechanism of the body in its fight or flight mechanism. Most of these perceived danger situations are actually nonphysical situations (such as anxiety over losing a job). CAUSES  The causes of an anxiety or panic attack are many. Panic attacks may occur in otherwise healthy people given a certain set of circumstances. There may be a genetic cause for panic attacks. Some medications may also have anxiety as a side effect. SYMPTOMS  Some of the most common feelings are:  Intense terror.  Dizziness, feeling faint.  Hot and cold flashes.  Fear of going crazy.  Feelings that nothing is real.  Sweating.  Shaking.  Chest pain or a fast heartbeat (palpitations).  Smothering, choking sensations.  Feelings of impending doom and that death is near.  Tingling of extremities, this may be from over-breathing.  Altered reality (derealization).  Being detached from yourself (depersonalization). Several symptoms can be present to make up anxiety or panic attacks. DIAGNOSIS  The evaluation by your caregiver will depend on the type of symptoms you are experiencing. The diagnosis of anxiety or panic attack is made when no physical illness can be determined to be a cause of the symptoms. TREATMENT  Treatment to prevent anxiety and panic attacks may include:  Avoidance of circumstances that cause anxiety.  Reassurance and relaxation.  Regular exercise.  Relaxation therapies, such as yoga.  Psychotherapy with a psychiatrist or  therapist.  Avoidance of caffeine, alcohol and illegal drugs.  Prescribed medication. SEEK IMMEDIATE MEDICAL CARE IF:   You experience panic attack symptoms that are different than your usual symptoms.  You have any worsening or concerning symptoms. Document Released: 05/08/2005 Document Revised: 07/31/2011 Document Reviewed: 09/09/2009 [facility] Patient Information 2013 [place], [place].  Pregnancy - Third Trimester The third trimester of pregnancy (the last 3 months) is a period of the most rapid growth for you and your baby. The baby approaches a length of 20 inches and a weight of 6 to 10 pounds. The baby is adding on fat and getting ready for life outside your body. While inside, babies have periods of sleeping and waking, suck their thumbs, and hiccups. You can often feel small contractions of the uterus. This is false labor. It is also called Braxton-Hicks contractions. This is like a practice for labor. The usual problems in this stage of pregnancy include more difficulty breathing, swelling of the hands and feet from water retention, and having to urinate more often because of the uterus and baby pressing on your bladder.  PRENATAL EXAMS  Blood work may continue to be done during prenatal exams. These tests are done to check on your health and the probable health of your baby. Blood work is used to follow your blood levels (hemoglobin). Anemia (low hemoglobin) is common during pregnancy. Iron and vitamins are given to help prevent this. You may also continue to be checked for diabetes. Some of the past blood tests may be done again.  The size of the uterus is measured during each visit. This makes sure your baby  is growing properly according to your pregnancy dates.  Your blood pressure is checked every prenatal visit. This is to make sure you are not getting toxemia.  Your urine is checked every prenatal visit for infection, diabetes and protein.  Your weight is checked at each  visit. This is done to make sure gains are happening at the suggested rate and that you and your baby are growing normally.  Sometimes, an ultrasound is performed to confirm the position and the proper growth and development of the baby. This is a test done that bounces harmless sound waves off the baby so your caregiver can more accurately determine due dates.  Discuss the type of pain medication and anesthesia you will have during your labor and delivery.  Discuss the possibility and anesthesia if a Cesarean Section might be necessary.  Inform your caregiver if there is any mental or physical violence at home. Sometimes, a specialized non-stress test, contraction stress test and biophysical profile are done to make sure the baby is not having a problem. Checking the amniotic fluid surrounding the baby is called an amniocentesis. The amniotic fluid is removed by sticking a needle into the belly (abdomen). This is sometimes done near the end of pregnancy if an early delivery is required. In this case, it is done to help make sure the baby's lungs are mature enough for the baby to live outside of the womb. If the lungs are not mature and it is unsafe to deliver the baby, an injection of cortisone medication is given to the mother 1 to 2 days before the delivery. This helps the baby's lungs mature and makes it safer to deliver the baby. CHANGES OCCURING IN THE THIRD TRIMESTER OF PREGNANCY Your body goes through many changes during pregnancy. They vary from person to person. Talk to your caregiver about changes you notice and are concerned about.  During the last trimester, you have probably had an increase in your appetite. It is normal to have cravings for certain foods. This varies from person to person and pregnancy to pregnancy.  You may begin to get stretch marks on your hips, abdomen, and breasts. These are normal changes in the body during pregnancy. There are no exercises or medications to take  which prevent this change.  Constipation may be treated with a stool softener or adding bulk to your diet. Drinking lots of fluids, fiber in vegetables, fruits, and whole grains are helpful.  Exercising is also helpful. If you have been very active up until your pregnancy, most of these activities can be continued during your pregnancy. If you have been less active, it is helpful to start an exercise program such as walking. Consult your caregiver before starting exercise programs.  Avoid all smoking, alcohol, un-prescribed drugs, herbs and "street drugs" during your pregnancy. These chemicals affect the formation and growth of the baby. Avoid chemicals throughout the pregnancy to ensure the delivery of a healthy infant.  Backache, varicose veins and hemorrhoids may develop or get worse.  You will tire more easily in the third trimester, which is normal.  The baby's movements may be stronger and more often.  You may become short of breath easily.  Your belly button may stick out.  A yellow discharge may leak from your breasts called colostrum.  You may have a bloody mucus discharge. This usually occurs a few days to a week before labor begins. HOME CARE INSTRUCTIONS   Keep your caregiver's appointments. Follow your caregiver's instructions regarding medication  use, exercise, and diet.  During pregnancy, you are providing food for you and your baby. Continue to eat regular, well-balanced meals. Choose foods such as meat, fish, milk and other low fat dairy products, vegetables, fruits, and whole-grain breads and cereals. Your caregiver will tell you of the ideal weight gain.  A physical sexual relationship may be continued throughout pregnancy if there are no other problems such as early (premature) leaking of amniotic fluid from the membranes, vaginal bleeding, or belly (abdominal) pain.  Exercise regularly if there are no restrictions. Check with your caregiver if you are unsure of the  safety of your exercises. Greater weight gain will occur in the last 2 trimesters of pregnancy. Exercising helps:  Control your weight.  Get you in shape for labor and delivery.  You lose weight after you deliver.  Rest a lot with legs elevated, or as needed for leg cramps or low back pain.  Wear a good support or jogging bra for breast tenderness during pregnancy. This may help if worn during sleep. Pads or tissues may be used in the bra if you are leaking colostrum.  Do not use hot tubs, steam rooms, or saunas.  Wear your seat belt when driving. This protects you and your baby if you are in an accident.  Avoid raw meat, cat litter boxes and soil used by cats. These carry germs that can cause birth defects in the baby.  It is easier to loose urine during pregnancy. Tightening up and strengthening the pelvic muscles will help with this problem. You can practice stopping your urination while you are going to the bathroom. These are the same muscles you need to strengthen. It is also the muscles you would use if you were trying to stop from passing gas. You can practice tightening these muscles up 10 times a set and repeating this about 3 times per day. Once you know what muscles to tighten up, do not perform these exercises during urination. It is more likely to cause an infection by backing up the urine.  Ask for help if you have financial, counseling or nutritional needs during pregnancy. Your caregiver will be able to offer counseling for these needs as well as refer you for other special needs.  Make a list of emergency phone numbers and have them available.  Plan on getting help from family or friends when you go home from the hospital.  Make a trial run to the hospital.  Take prenatal classes with the father to understand, practice and ask questions about the labor and delivery.  Prepare the baby's room/nursery.  Do not travel out of the city unless it is absolutely necessary and  with the advice of your caregiver.  Wear only low or no heal shoes to have better balance and prevent falling. MEDICATIONS AND DRUG USE IN PREGNANCY  Take prenatal vitamins as directed. The vitamin should contain 1 milligram of folic acid. Keep all vitamins out of reach of children. Only a couple vitamins or tablets containing iron may be fatal to a baby or young child when ingested.  Avoid use of all medications, including herbs, over-the-counter medications, not prescribed or suggested by your caregiver. Only take over-the-counter or prescription medicines for pain, discomfort, or fever as directed by your caregiver. Do not use aspirin, ibuprofen (Motrin, Advil, Nuprin) or naproxen (Aleve) unless OK'd by your caregiver.  Let your caregiver also know about herbs you may be using.  Alcohol is related to a number of birth defects.  This includes fetal alcohol syndrome. All alcohol, in any form, should be avoided completely. Smoking will cause low birth rate and premature babies.  Street/illegal drugs are very harmful to the baby. They are absolutely forbidden. A baby born to an addicted mother will be addicted at birth. The baby will go through the same withdrawal an adult does. SEEK MEDICAL CARE IF: You have any concerns or worries during your pregnancy. It is better to call with your questions if you feel they cannot wait, rather than worry about them. DECISIONS ABOUT CIRCUMCISION You may or may not know the sex of your baby. If you know your baby is a boy, it may be time to think about circumcision. Circumcision is the removal of the foreskin of the penis. This is the skin that covers the sensitive end of the penis. There is no proven medical need for this. Often this decision is made on what is popular at the time or based upon religious beliefs and social issues. You can discuss these issues with your caregiver or pediatrician. SEEK IMMEDIATE MEDICAL CARE IF:   An unexplained oral  temperature above 102 F (38.9 C) develops, or as your caregiver suggests.  You have leaking of fluid from the vagina (birth canal). If leaking membranes are suspected, take your temperature and tell your caregiver of this when you call.  There is vaginal spotting, bleeding or passing clots. Tell your caregiver of the amount and how many pads are used.  You develop a bad smelling vaginal discharge with a change in the color from clear to white.  You develop vomiting that lasts more than 24 hours.  You develop chills or fever.  You develop shortness of breath.  You develop burning on urination.  You loose more than 2 pounds of weight or gain more than 2 pounds of weight or as suggested by your caregiver.  You notice sudden swelling of your face, hands, and feet or legs.  You develop belly (abdominal) pain. Round ligament discomfort is a common non-cancerous (benign) cause of abdominal pain in pregnancy. Your caregiver still must evaluate you.  You develop a severe headache that does not go away.  You develop visual problems, blurred or double vision.  If you have not felt your baby move for more than 1 hour. If you think the baby is not moving as much as usual, eat something with sugar in it and lie down on your left side for an hour. The baby should move at least 4 to 5 times per hour. Call right away if your baby moves less than that.  You fall, are in a car accident or any kind of trauma.  There is mental or physical violence at home. Document Released: 05/02/2001 Document Revised: 07/31/2011 Document Reviewed: 11/04/2008 [facility] Patient Information 2013 [place], [place].  Breastfeeding Deciding to breastfeed is one of the best choices you can make for you and your baby. The information that follows gives a brief overview of the benefits of breastfeeding as well as common topics surrounding breastfeeding. BENEFITS OF BREASTFEEDING For the baby  The first milk (colostrum)  helps the baby's digestive system function better.   There are antibodies in the mother's milk that help the baby fight off infections.   The baby has a lower incidence of asthma, allergies, and sudden infant death syndrome (SIDS).   The nutrients in breast milk are better for the baby than infant formulas, and breast milk helps the baby's brain grow better.   Babies  who breastfeed have less gas, colic, and constipation.  For the mother  Breastfeeding helps develop a very special bond between the mother and her baby.   Breastfeeding is convenient, always available at the correct temperature, and costs nothing.   Breastfeeding burns calories in the mother and helps her lose weight that was gained during pregnancy.   Breastfeeding makes the uterus contract back down to normal size faster and slows bleeding following delivery.   Breastfeeding mothers have a lower risk of developing breast cancer.  BREASTFEEDING FREQUENCY  A healthy, full-term baby may breastfeed as often as every hour or space his or her feedings to every 3 hours.   Watch your baby for signs of hunger. Nurse your baby if he or she shows signs of hunger. How often you nurse will vary from baby to baby.   Nurse as often as the baby requests, or when you feel the need to reduce the fullness of your breasts.   Awaken the baby if it has been 3 4 hours since the last feeding.   Frequent feeding will help the mother make more milk and will help prevent problems, such as sore nipples and engorgement of the breasts.  BABY'S POSITION AT THE BREAST  Whether lying down or sitting, be sure that the baby's tummy is facing your tummy.   Support the breast with 4 fingers underneath the breast and the thumb above. Make sure your fingers are well away from the nipple and baby's mouth.   Stroke the baby's lips gently with your finger or nipple.   When the baby's mouth is open wide enough, place all of your nipple  and as much of the areola as possible into your baby's mouth.   Pull the baby in close so the tip of the nose and the baby's cheeks touch the breast during the feeding.  FEEDINGS AND SUCTION  The length of each feeding varies from baby to baby and from feeding to feeding.   The baby must suck about 2 3 minutes for your milk to get to him or her. This is called a "let down." For this reason, allow the baby to feed on each breast as long as he or she wants. Your baby will end the feeding when he or she has received the right balance of nutrients.   To break the suction, put your finger into the corner of the baby's mouth and slide it between his or her gums before removing your breast from his or her mouth. This will help prevent sore nipples.  HOW TO TELL WHETHER YOUR BABY IS GETTING ENOUGH BREAST MILK. Wondering whether or not your baby is getting enough milk is a common concern among mothers. You can be assured that your baby is getting enough milk if:   Your baby is actively sucking and you hear swallowing.   Your baby seems relaxed and satisfied after a feeding.   Your baby nurses at least 8 12 times in a 24 hour time period. Nurse your baby until he or she unlatches or falls asleep at the first breast (at least 10 20 minutes), then offer the second side.   Your baby is wetting 5 6 disposable diapers (6 8 cloth diapers) in a 24 hour period by 26 75 days of age.   Your baby is having at least 3 4 stools every 24 hours for the first 6 weeks. The stool should be soft and yellow.   Your baby should gain 4 7  ounces per week after he or she is 73 days old.   Your breasts feel softer after nursing.  REDUCING BREAST ENGORGEMENT  In the first week after your baby is born, you may experience signs of breast engorgement. When breasts are engorged, they feel heavy, warm, full, and may be tender to the touch. You can reduce engorgement if you:   Nurse frequently, every 2 3 hours.  Mothers who breastfeed early and often have fewer problems with engorgement.   Place light ice packs on your breasts for 10 20 minutes between feedings. This reduces swelling. Wrap the ice packs in a lightweight towel to protect your skin. Bags of frozen vegetables work well for this purpose.   Take a warm shower or apply warm, moist heat to your breast for 5 10 minutes just before each feeding. This increases circulation and helps the milk flow.   Gently massage your breast before and during the feeding. Using your finger tips, massage from the chest wall towards your nipple in a circular motion.   Make sure that the baby empties at least one breast at every feeding before switching sides.   Use a breast pump to empty the breasts if your baby is sleepy or not nursing well. You may also want to pump if you are returning to work oryou feel you are getting engorged.   Avoid bottle feeds, pacifiers, or supplemental feedings of water or juice in place of breastfeeding. Breast milk is all the food your baby needs. It is not necessary for your baby to have water or formula. In fact, to help your breasts make more milk, it is best not to give your baby supplemental feedings during the early weeks.   Be sure the baby is latched on and positioned properly while breastfeeding.   Wear a supportive bra, avoiding underwire styles.   Eat a balanced diet with enough fluids.   Rest often, relax, and take your prenatal vitamins to prevent fatigue, stress, and anemia.  If you follow these suggestions, your engorgement should improve in 24 48 hours. If you are still experiencing difficulty, call your lactation consultant or caregiver.  CARING FOR YOURSELF Take care of your breasts  Bathe or shower daily.   Avoid using soap on your nipples.   Start feedings on your left breast at one feeding and on your right breast at the next feeding.   You will notice an increase in your milk supply 2 5  days after delivery. You may feel some discomfort from engorgement, which makes your breasts very firm and often tender. Engorgement "peaks" out within 24 48 hours. In the meantime, apply warm moist towels to your breasts for 5 10 minutes before feeding. Gentle massage and expression of some milk before feeding will soften your breasts, making it easier for your baby to latch on.   Wear a well-fitting nursing bra, and air dry your nipples for a 3 after each feeding.   Only use cotton bra pads.   Only use pure lanolin on your nipples after nursing. You do not need to wash it off before feeding the baby again. Another option is to express a few drops of breast milk and gently massage it into your nipples.  Take care of yourself  Eat well-balanced meals and nutritious snacks.   Drinking milk, fruit juice, and water to satisfy your thirst (about 8 glasses a day).   Get plenty of rest.  Avoid foods that you notice affect the baby  in a bad way.  SEEK MEDICAL CARE IF:   You have difficulty with breastfeeding and need help.   You have a hard, red, sore area on your breast that is accompanied by a fever.   Your baby is too sleepy to eat well or is having trouble sleeping.   Your baby is wetting less than 6 diapers a day, by 50 days of age.   Your baby's skin or white part of his or her eyes is more yellow than it was in the hospital.   You feel depressed.  Document Released: 05/08/2005 Document Revised: 11/07/2011 Document Reviewed: 08/06/2011 [facility] Patient Information 2013 [place], [place].

## 2012-06-26 ENCOUNTER — Other Ambulatory Visit: Payer: Self-pay | Admitting: Obstetrics & Gynecology

## 2012-06-26 ENCOUNTER — Observation Stay (HOSPITAL_COMMUNITY)
Admission: AD | Admit: 2012-06-26 | Discharge: 2012-06-30 | Disposition: A | Discharge status: Home / Self Care | Payer: Medicaid Other | Source: Ambulatory Visit | Attending: Obstetrics and Gynecology | Admitting: Obstetrics and Gynecology

## 2012-06-26 ENCOUNTER — Encounter (HOSPITAL_COMMUNITY): Payer: Self-pay

## 2012-06-26 DIAGNOSIS — O99330 Smoking (tobacco) complicating pregnancy, unspecified trimester: Secondary | ICD-10-CM

## 2012-06-26 DIAGNOSIS — O4700 False labor before 37 completed weeks of gestation, unspecified trimester: Secondary | ICD-10-CM

## 2012-06-26 DIAGNOSIS — F419 Anxiety disorder, unspecified: Secondary | ICD-10-CM

## 2012-06-26 DIAGNOSIS — O34219 Maternal care for unspecified type scar from previous cesarean delivery: Secondary | ICD-10-CM | POA: Insufficient documentation

## 2012-06-26 DIAGNOSIS — O26872 Cervical shortening, second trimester: Secondary | ICD-10-CM | POA: Diagnosis present

## 2012-06-26 DIAGNOSIS — F411 Generalized anxiety disorder: Secondary | ICD-10-CM | POA: Insufficient documentation

## 2012-06-26 DIAGNOSIS — O99340 Other mental disorders complicating pregnancy, unspecified trimester: Secondary | ICD-10-CM | POA: Insufficient documentation

## 2012-06-26 DIAGNOSIS — O26879 Cervical shortening, unspecified trimester: Secondary | ICD-10-CM | POA: Insufficient documentation

## 2012-06-26 DIAGNOSIS — R002 Palpitations: Secondary | ICD-10-CM | POA: Insufficient documentation

## 2012-06-26 DIAGNOSIS — N883 Incompetence of cervix uteri: Secondary | ICD-10-CM

## 2012-06-26 LAB — CBC
MCHC: 34.1 g/dL (ref 30.0–36.0)
Platelets: 245 10*3/uL (ref 150–400)
RDW: 12.9 % (ref 11.5–15.5)
WBC: 9.6 10*3/uL (ref 4.0–10.5)

## 2012-06-26 LAB — TYPE AND SCREEN
ABO/RH(D): A POS
Antibody Screen: NEGATIVE

## 2012-06-26 LAB — URINALYSIS, ROUTINE W REFLEX MICROSCOPIC
Glucose, UA: NEGATIVE mg/dL
Hgb urine dipstick: NEGATIVE
Specific Gravity, Urine: 1.015 (ref 1.005–1.030)
Urobilinogen, UA: 1 mg/dL (ref 0.0–1.0)
pH: 8.5 — ABNORMAL HIGH (ref 5.0–8.0)

## 2012-06-26 LAB — FETAL FIBRONECTIN: Fetal Fibronectin: POSITIVE — AB

## 2012-06-26 MED ORDER — DOCUSATE SODIUM 100 MG PO CAPS
100.0000 mg | ORAL_CAPSULE | Freq: Every day | ORAL | Status: DC
  Administered 2012-06-27 – 2012-06-29 (×3): 100 mg via ORAL
  Filled 2012-06-26 (×3): qty 1

## 2012-06-26 MED ORDER — CALCIUM CARBONATE ANTACID 500 MG PO CHEW: 2.0000 | CHEWABLE_TABLET | ORAL | Status: DC | PRN

## 2012-06-26 MED ORDER — SODIUM CHLORIDE 0.9 % IV SOLN
INTRAVENOUS | Status: DC
  Administered 2012-06-26 – 2012-06-28 (×6): via INTRAVENOUS

## 2012-06-26 MED ORDER — PRENATAL MULTIVITAMIN CH
1.0000 | ORAL_TABLET | Freq: Every day | ORAL | Status: DC
  Administered 2012-06-27 – 2012-06-29 (×3): 1 via ORAL
  Filled 2012-06-26 (×3): qty 1

## 2012-06-26 MED ORDER — INDOMETHACIN 25 MG PO CAPS
25.0000 mg | ORAL_CAPSULE | Freq: Four times a day (QID) | ORAL | Status: AC
  Administered 2012-06-27 – 2012-06-29 (×12): 25 mg via ORAL
  Filled 2012-06-26 (×12): qty 1

## 2012-06-26 MED ORDER — INDOMETHACIN 50 MG PO CAPS
50.0000 mg | ORAL_CAPSULE | Freq: Once | ORAL | Status: AC
  Administered 2012-06-26: 50 mg via ORAL
  Filled 2012-06-26: qty 1

## 2012-06-26 MED ORDER — ZOLPIDEM TARTRATE 5 MG PO TABS
5.0000 mg | ORAL_TABLET | Freq: Every evening | ORAL | Status: DC | PRN
  Administered 2012-06-26: 5 mg via ORAL
  Filled 2012-06-26 (×2): qty 1

## 2012-06-26 MED ORDER — ACETAMINOPHEN 325 MG PO TABS
650.0000 mg | ORAL_TABLET | ORAL | Status: DC | PRN
  Administered 2012-06-28 (×2): 650 mg via ORAL
  Filled 2012-06-26 (×2): qty 2

## 2012-06-26 NOTE — MAU Provider Note (Signed)
History      CSN: [id number]   Arrival date and time: 06/26/12 1755    First Provider Initiated Contact with Patient 06/26/12 1904          Chief Complaint   Patient presents with   .  Contractions    Has been on bedrest for wks, short cervix. Started having painful but irregular contractions yesterday. Now every 4-5min. No bleeding, no leaking.           HPI32 yr g5p1213 at 30 weeks 5days with onset of recurrent contractions with no srom or bleeding. Cervix closed ext os per W Muhammad, CNM, and FFN collected , pending. Contractions are q4-5 mins. Will admit for tocolytic. Pt has received BMZ in past in January, and has also had Magnesium sulfate for tocolysis in past.  She CANNOT take Procardia due to adverse reaction in January when given it.          Past Medical History   Diagnosis  Date   .  Migraine     .  Insomnia     .  Abnormal Pap smear  2004   .  Cancer  2004       cervical   .  Interstitial cystitis         Past Surgical History   Procedure  Date   .  Leep  2004   .  Cone bx  2004   .  Cystoscopy  2007   .  Cesarean section  2005   .  Hysteroscopy  2007       Family History   Problem  Relation  Age of Onset   .  Hypertension  Father     .  Cancer  Maternal Grandmother         lung/brain   .  Cancer  Paternal Grandfather         lung/prostate cancer       History   Substance Use Topics   .  Smoking status:  Former Smoker -- 0.2 packs/day for 15 years       Types:  Cigarettes       Quit date:  01/15/2012   .  Smokeless tobacco:  Never Used   .  Alcohol Use:  No         Comment: seldom      Allergies:  Allergies   Allergen  Reactions   .  Vancomycin  Anaphylaxis   .  Procardia (Nifedipine)  Swelling   .  Sulfur         Reaction unknown   .  Z-Pak (Azithromycin)         Reaction unknown     .  Amoxicillin  Rash   .  Clindamycin/Lincomycin  Rash   .  Penicillins  Rash       Prescriptions prior to admission   Medication   Sig  Dispense  Refill   .  acetaminophen (TYLENOL) 500 MG tablet  Take 500 mg by mouth every 6 (six) hours as needed. pain         .  Prenatal Vit-Fe Fumarate-FA (PRENATAL MULTIVITAMIN) TABS  Take 1 tablet by mouth daily.         .  promethazine (PHENERGAN) 25 MG tablet  Take 1 tablet (25 mg total) by mouth every 6 (six) hours as needed for nausea.   30 tablet   2   .  zolpidem (AMBIEN) 10 MG tablet  Take 1 tablet (10   mg total) by mouth at bedtime as needed for sleep.   30 tablet   1      ROS Physical Exam      Blood pressure 118/63, pulse 101, temperature 98.5 F (36.9 C), temperature source Oral, resp. rate 18, height 5' 3.25" (1.607 m), weight 55.339 kg (122 lb), last menstrual period 11/18/2011.   Physical ExamPhysical Examination: General appearance - alert, well appearing, and in no distress, oriented to person, place, and time, normal appearing weight and anxious Mental status - normal mood, behavior, speech, dress, motor activity, and thought processes Chest - clear to auscultation, no wheezes, rales or rhonchi, symmetric air entry Heart - normal rate and regular rhythm Abdomen - soft, nontender, nondistended, no masses or organomegaly Gravid uterus c/w dates. Pelvic - per W Muhammad, closed ext os, FFN collected Extremities - peripheral pulses normal, no pedal edema, no clubbing or cyanosis      MAU Course    Procedures   MDM History, eval , review of labs     Assessment and Plan    Recurrent PTL symptoms in 32 yr old at 30+5 wks, History of recurrent Preterm delivery, and s/p Conization of cervix. Admit for tocolysis, will us indocin 50 p.o, then 25 mg q 6h x 48 hours.  May d/c home earlier with resolution of contractions.   Kamira Mellette V 06/26/2012, 7:36 PM      

## 2012-06-26 NOTE — MAU Note (Signed)
Has been on bedrest for wks, short cervix.  Started having painful but irregular contractions yesterday.   Now every 4-55min. No bleeding, no leaking.

## 2012-06-26 NOTE — H&P (Signed)
History      CSN: [id number]   Arrival date and time: 06/26/12 1755    First Provider Initiated Contact with Patient 06/26/12 1904          Chief Complaint   Patient presents with   .  Contractions    Has been on bedrest for wks, short cervix. Started having painful but irregular contractions yesterday. Now every 4-50min. No bleeding, no leaking.           HPI32 yr g5p1213 at 30 weeks 5days with onset of recurrent contractions with no srom or bleeding. Cervix closed ext os per Roney Marion, CNM, and FFN collected , pending. Contractions are q4-5 mins. Will admit for tocolytic. Pt has received BMZ in past in January, and has also had Magnesium sulfate for tocolysis in past.  She CANNOT take Procardia due to adverse reaction in January when given it.          Past Medical History   Diagnosis  Date   .  Migraine     .  Insomnia     .  Abnormal Pap smear  2004   .  Cancer  2004       cervical   .  Interstitial cystitis         Past Surgical History   Procedure  Date   .  Leep  2004   .  Cone bx  2004   .  Cystoscopy  2007   .  Cesarean section  2005   .  Hysteroscopy  2007       Family History   Problem  Relation  Age of Onset   .  Hypertension  Father     .  Cancer  Maternal Grandmother         lung/brain   .  Cancer  Paternal Grandfather         lung/prostate cancer       History   Substance Use Topics   .  Smoking status:  Former Smoker -- 0.2 packs/day for 15 years       Types:  Cigarettes       Quit date:  01/15/2012   .  Smokeless tobacco:  Never Used   .  Alcohol Use:  No         Comment: seldom      Allergies:  Allergies   Allergen  Reactions   .  Vancomycin  Anaphylaxis   .  Procardia (Nifedipine)  Swelling   .  Sulfur         Reaction unknown   .  Z-Pak (Azithromycin)         Reaction unknown     .  Amoxicillin  Rash   .  Clindamycin/Lincomycin  Rash   .  Penicillins  Rash       Prescriptions prior to admission   Medication   Sig  Dispense  Refill   .  acetaminophen (TYLENOL) 500 MG tablet  Take 500 mg by mouth every 6 (six) hours as needed. pain         .  Prenatal Vit-Fe Fumarate-FA (PRENATAL MULTIVITAMIN) TABS  Take 1 tablet by mouth daily.         .  promethazine (PHENERGAN) 25 MG tablet  Take 1 tablet (25 mg total) by mouth every 6 (six) hours as needed for nausea.   30 tablet   2   .  zolpidem (AMBIEN) 10 MG tablet  Take 1 tablet (10  mg total) by mouth at bedtime as needed for sleep.   30 tablet   1      ROS Physical Exam      Blood pressure 118/63, pulse 101, temperature 98.5 F (36.9 C), temperature source Oral, resp. rate 18, height 5' 3.25" (1.607 m), weight 55.339 kg (122 lb), last menstrual period 11/18/2011.   Physical ExamPhysical Examination: General appearance - alert, well appearing, and in no distress, oriented to person, place, and time, normal appearing weight and anxious Mental status - normal mood, behavior, speech, dress, motor activity, and thought processes Chest - clear to auscultation, no wheezes, rales or rhonchi, symmetric air entry Heart - normal rate and regular rhythm Abdomen - soft, nontender, nondistended, no masses or organomegaly Gravid uterus c/w dates. Pelvic - per Roney Marion, closed ext os, FFN collected Extremities - peripheral pulses normal, no pedal edema, no clubbing or cyanosis      MAU Course    Procedures   MDM History, eval , review of labs     Assessment and Plan    Recurrent PTL symptoms in 32 yr old at 30+5 wks, History of recurrent Preterm delivery, and s/p Conization of cervix. Admit for tocolysis, will [place] indocin 50 p.o, then 25 mg q 6h x 48 hours.  May d/c home earlier with resolution of contractions.   Chau Sawin V 06/26/2012, 7:36 PM

## 2012-06-26 NOTE — MAU Provider Note (Signed)
History     CSN: [id number]  Arrival date and time: 06/26/12 1755   First Provider Initiated Contact with Patient 06/26/12 1904      Chief Complaint  Patient presents with  . Contractions   HPI  Pt is a G5P121 at 30.5 wks here with report of contractions that increased in intensity at 1600 today.  Denies vaginal bleeding or leaking of fluid.  +fetal movement.  Using 17-P weekly for history of preterm labor.  Last ultrasound showed cervical length of 1.4 cm on 06/11/12.  On bedrest/pelvic rest.  Received BMZ in January 9&10 2014.   Past Medical History  Diagnosis Date  . Migraine   . Insomnia   . Abnormal Pap smear 2004  . Cancer 2004    cervical  . Interstitial cystitis     Past Surgical History  Procedure Date  . Leep 2004  . Cone bx 2004  . Cystoscopy 2007  . Cesarean section 2005  . Hysteroscopy 2007    Family History  Problem Relation Age of Onset  . Hypertension Father   . Cancer Maternal Grandmother     lung/brain  . Cancer Paternal Grandfather     lung/prostate cancer    History  Substance Use Topics  . Smoking status: Former Smoker -- 0.2 packs/day for 15 years    Types: Cigarettes    Quit date: 01/15/2012  . Smokeless tobacco: Never Used  . Alcohol Use: No     Comment: seldom    Allergies:  Allergies  Allergen Reactions  . Vancomycin Anaphylaxis  . Procardia (Nifedipine) Swelling  . Sulfur     Reaction unknown  . Z-Pak (Azithromycin)     Reaction unknown   . Amoxicillin Rash  . Clindamycin/Lincomycin Rash  . Penicillins Rash    Prescriptions prior to admission  Medication Sig Dispense Refill  . acetaminophen (TYLENOL) 500 MG tablet Take 500 mg by mouth every 6 (six) hours as needed. pain      . Prenatal Vit-Fe Fumarate-FA (PRENATAL MULTIVITAMIN) TABS Take 1 tablet by mouth daily.      . promethazine (PHENERGAN) 25 MG tablet Take 1 tablet (25 mg total) by mouth every 6 (six) hours as needed for nausea.  30 tablet  2  . zolpidem (AMBIEN)  10 MG tablet Take 1 tablet (10 mg total) by mouth at bedtime as needed for sleep.  30 tablet  1    Review of Systems  Gastrointestinal: Positive for abdominal pain.  All other systems reviewed and are negative.   Physical Exam   Blood pressure 118/63, pulse 101, temperature 98.5 F (36.9 C), temperature source Oral, resp. rate 18, height 5' 3.25" (1.607 m), weight 55.339 kg (122 lb), last menstrual period 11/18/2011.  Physical Exam  Constitutional: She is oriented to person, place, and time. She appears well-developed and well-nourished. No distress.  HENT:  Head: Normocephalic.  Neck: Normal range of motion. Neck supple.  Cardiovascular: Normal rate, regular rhythm and normal heart sounds.   Respiratory: Effort normal and breath sounds normal.  GI: Soft. There is no tenderness.  Genitourinary: No bleeding around the vagina. Vaginal discharge (mucusy) found.  Musculoskeletal: Normal range of motion. She exhibits no edema.  Neurological: She is alert and oriented to person, place, and time.  Skin: Skin is warm and dry.   Dilation: 1 Effacement (%): 50 Cervical Position: Middle Station: -3 Exam by:: Roney Marion, CNM   MAU Course  Procedures  Dr. Emelda Fear assumes care of patient > completes H&P  Assessment and Plan  Preterm Contractions GBS positive  Plan: Admit to antenatal Recurrent PTL symptoms in 32 yr old at 30+5 wks, History of recurrent Preterm delivery, and s/p Conization of cervix.  Admit for tocolysis, will [place] indocin 50 p.o, then 25 mg q 6h x 48 hours. May d/c home earlier with resolution of contractions.     [facility] 06/26/2012, 7:09 PM

## 2012-06-27 ENCOUNTER — Encounter (HOSPITAL_COMMUNITY): Payer: Medicaid Other

## 2012-06-27 ENCOUNTER — Observation Stay (HOSPITAL_COMMUNITY): Payer: Medicaid Other

## 2012-06-27 DIAGNOSIS — O4700 False labor before 37 completed weeks of gestation, unspecified trimester: Secondary | ICD-10-CM | POA: Diagnosis present

## 2012-06-27 MED ORDER — BETAMETHASONE SOD PHOS & ACET 6 (3-3) MG/ML IJ SUSP
12.0000 mg | Freq: Once | INTRAMUSCULAR | Status: AC
  Administered 2012-06-28: 12 mg via INTRAMUSCULAR
  Filled 2012-06-27: qty 2

## 2012-06-27 MED ORDER — PANTOPRAZOLE SODIUM 40 MG PO TBEC
40.0000 mg | DELAYED_RELEASE_TABLET | Freq: Every day | ORAL | Status: DC
  Administered 2012-06-27 – 2012-06-29 (×3): 40 mg via ORAL
  Filled 2012-06-27 (×4): qty 1

## 2012-06-27 MED ORDER — ZOLPIDEM TARTRATE 5 MG PO TABS
5.0000 mg | ORAL_TABLET | Freq: Every evening | ORAL | Status: DC | PRN
  Administered 2012-06-27: 5 mg via ORAL

## 2012-06-27 MED ORDER — PROMETHAZINE HCL 25 MG PO TABS
25.0000 mg | ORAL_TABLET | Freq: Four times a day (QID) | ORAL | Status: DC | PRN
  Administered 2012-06-27 – 2012-06-29 (×2): 25 mg via ORAL
  Filled 2012-06-27 (×2): qty 1

## 2012-06-27 MED ORDER — BETAMETHASONE SOD PHOS & ACET 6 (3-3) MG/ML IJ SUSP
12.0000 mg | Freq: Once | INTRAMUSCULAR | Status: AC
  Administered 2012-06-27: 12 mg via INTRAMUSCULAR
  Filled 2012-06-27: qty 2

## 2012-06-27 MED ORDER — ZOLPIDEM TARTRATE 5 MG PO TABS
10.0000 mg | ORAL_TABLET | Freq: Every evening | ORAL | Status: DC | PRN
  Administered 2012-06-27 – 2012-06-29 (×3): 10 mg via ORAL
  Filled 2012-06-27: qty 2
  Filled 2012-06-27 (×2): qty 1
  Filled 2012-06-27: qty 2

## 2012-06-27 NOTE — Progress Notes (Signed)
FACULTY PRACTICE ANTEPARTUM(COMPREHENSIVE) NOTE  Natalie Duffy is a 32 y.o. [phone] at [redacted]w[redacted]d by early ultrasound who is admitted for Preterm labor, history of short cervix s/p conization of cervix, also hx of preterm delivery on 17 P injections weekly. Last shot of 17-P was Monday, 06/24/12. Pt received Betamethasone x 1 shot at 27 weeks, did not return to MAU for the followup shot as directed.  I will order a followup dose x1..   Fetal presentation is cephalic. Length of Stay:  1  Days  Subjective: Pt admitted , placed on Indocin as she cannot tolerate Procardia, and has had diminished contractions, but irregular contractions noted on EFM Patient reports the fetal movement as active. Patient reports uterine contraction  activity as irregular, every 20 minutes. Patient reports  vaginal bleeding as none. Patient describes fluid per vagina as None.  Vitals:  Blood pressure 115/68, pulse 79, temperature 98.4 F (36.9 C), temperature source Oral, resp. rate 18, height 5\' 3"  (1.6 m), weight 55.339 kg (122 lb), last menstrual period 11/18/2011. Physical Examination:  General appearance - alert, well appearing, and in no distress, well hydrated and anxious Heart - normal rate and regular rhythm Abdomen - soft, nontender, nondistended Fundal Height:  size less than dates and  at U + 8 cm Cervical Exam: Not evaluated. and was checked in [place] by CNM and found to be <0.5 cm/ thinned out/-2 and fetal presentation is cephalic. Extremities: extremities normal, atraumatic, no cyanosis or edema and Homans sign is negative, no sign of DVT with DTRs 2+ bilaterally Membranes:intact  Fetal Monitoring:  Baseline: 145 bpm contractions q 12 min at present.  Labs: FFN is positive   Recent Results (from the past 24 hour(s))  URINALYSIS, ROUTINE W REFLEX MICROSCOPIC   Collection Time   06/26/12  6:15 PM      Component Value Range   Color, Urine YELLOW  YELLOW   APPearance CLEAR  CLEAR   Specific Gravity,  Urine 1.015  1.005 - 1.030   pH 8.5 (*) 5.0 - 8.0   Glucose, UA NEGATIVE  NEGATIVE mg/dL   Hgb urine dipstick NEGATIVE  NEGATIVE   Bilirubin Urine NEGATIVE  NEGATIVE   Ketones, ur NEGATIVE  NEGATIVE mg/dL   Protein, ur NEGATIVE  NEGATIVE mg/dL   Urobilinogen, UA 1.0  0.0 - 1.0 mg/dL   Nitrite NEGATIVE  NEGATIVE   Leukocytes, UA NEGATIVE  NEGATIVE  CBC   Collection Time   06/26/12  7:35 PM      Component Value Range   WBC 9.6  4.0 - 10.5 K/uL   RBC 3.57 (*) 3.87 - 5.11 MIL/uL   Hemoglobin 10.0 (*) 12.0 - 15.0 g/dL   HCT 78.4 (*) 69.6 - 29.5 %   MCV 82.1  78.0 - 100.0 fL   MCH 28.0  26.0 - 34.0 pg   MCHC 34.1  30.0 - 36.0 g/dL   RDW 28.4  13.2 - 44.0 %   Platelets 245  150 - 400 K/uL  TYPE AND SCREEN   Collection Time   06/26/12  7:35 PM      Component Value Range   ABO/RH(D) A POS     Antibody Screen NEG     Sample Expiration 06/29/2012    ABO/RH   Collection Time   06/26/12  7:35 PM      Component Value Range   ABO/RH(D) A POS    FETAL FIBRONECTIN   Collection Time   06/26/12  7:36 PM  Component Value Range   Fetal Fibronectin POSITIVE (*) NEGATIVE    Imaging Studies:      Medications:  Scheduled    . betamethasone acetate-betamethasone sodium phosphate  12 mg Intramuscular Once  . docusate sodium  100 mg Oral Daily  . indomethacin  25 mg Oral Q6H  . prenatal multivitamin  1 tablet Oral Daily   I have reviewed the patient's current medications.  ASSESSMENT: Patient Active Problem List  Diagnosis  . SYNCOPE  . PALPITATIONS  . Smoker  . Migraine  . Supervision of other normal pregnancy  . Previous cesarean delivery, antepartum condition or complication  . Previous preterm delivery, antepartum  . Cervical shortening in second trimester  . History of conization of cervix  . Anxiety disorder    PLAN: Continue today as inpt on Indocin Give second dose BMZ today followup u/s in MFM for cervix length, and growth.   Rodnisha Blomgren V 06/27/2012,7:53  AM

## 2012-06-27 NOTE — Progress Notes (Signed)
UR completed 

## 2012-06-27 NOTE — Progress Notes (Signed)
To MFM for U/S and consult.

## 2012-06-28 ENCOUNTER — Ambulatory Visit (HOSPITAL_COMMUNITY): Payer: Self-pay

## 2012-06-28 MED ORDER — CYCLOBENZAPRINE HCL 10 MG PO TABS
10.0000 mg | ORAL_TABLET | Freq: Three times a day (TID) | ORAL | Status: DC | PRN
  Administered 2012-06-28: 10 mg via ORAL
  Filled 2012-06-28: qty 1

## 2012-06-28 MED ORDER — CYCLOBENZAPRINE HCL 10 MG PO TABS: 5.0000 mg | ORAL_TABLET | Freq: Three times a day (TID) | ORAL | Status: DC | PRN

## 2012-06-28 MED ORDER — SODIUM CHLORIDE 0.9 % IJ SOLN
3.0000 mL | Freq: Two times a day (BID) | INTRAMUSCULAR | Status: DC
  Administered 2012-06-28 – 2012-06-29 (×3): 3 mL via INTRAVENOUS

## 2012-06-28 NOTE — Progress Notes (Signed)
Patient ID: Natalie Duffy, female   DOB: 1980-11-30, 32 y.o.   MRN: [id number] FACULTY PRACTICE ANTEPARTUM(COMPREHENSIVE) NOTE  Natalie Duffy is a 32 y.o. [id number] at [redacted]w[redacted]d by best clinical estimate who is admitted for short cervix.   Fetal presentation is cephalic. Length of Stay:  2  Days  Subjective: No complaints Patient reports the fetal movement as active. Patient reports uterine contraction  activity as occasional. Patient reports  vaginal bleeding as none. Patient describes fluid per vagina as None.  Vitals:  Blood pressure 110/54, pulse 106, temperature 98.1 F (36.7 C), temperature source Oral, resp. rate 18, height 5\' 3"  (1.6 m), weight 122 lb (55.339 kg), last menstrual period 11/18/2011. Physical Examination:  General appearance - alert, well appearing, and in no distress Heart - normal rate and regular rhythm Abdomen - soft, nontender, nondistended Fundal Height:  size equals dates Cervical Exam: Not evaluated. Extremities: extremities normal, atraumatic, no cyanosis or edema and Homans sign is negative, no sign of DVT with DTRs 2+ bilaterally Membranes:intact  Fetal Monitoring:  Baseline: 130-140 bpm, Variability: Good {> 6 bpm) and Accelerations: Reactive  Labs:  No results found for this or any previous visit (from the past 24 hour(s)).  Imaging Studies:    Cephalic and cx 2 cm long  Medications:  Scheduled    . betamethasone acetate-betamethasone sodium phosphate  12 mg Intramuscular Once  . docusate sodium  100 mg Oral Daily  . indomethacin  25 mg Oral Q6H  . pantoprazole  40 mg Oral Daily  . prenatal multivitamin  1 tablet Oral Daily   I have reviewed the patient's current medications.  ASSESSMENT: Patient Active Problem List  Diagnosis  . SYNCOPE  . PALPITATIONS  . Smoker  . Migraine  . Supervision of other normal pregnancy  . Previous cesarean delivery, antepartum condition or complication  . Previous preterm delivery, antepartum  .  Cervical shortening  . History of conization of cervix  . Anxiety disorder  . Threatened preterm labor, antepartum    PLAN: Betamethasone second dose, observe for PTL  Rosaleigh Brazzel 06/28/2012,7:43 AM

## 2012-06-28 NOTE — Progress Notes (Signed)
NICU CN called, notified pt and husband requesting NICU tour, states she will call me back when available

## 2012-06-29 MED ORDER — LACTATED RINGERS IV BOLUS (SEPSIS)
1000.0000 mL | Freq: Once | INTRAVENOUS | Status: AC
  Administered 2012-06-29: 1000 mL via INTRAVENOUS

## 2012-06-29 MED ORDER — OXYCODONE-ACETAMINOPHEN 5-325 MG PO TABS
1.0000 | ORAL_TABLET | ORAL | Status: DC | PRN
  Administered 2012-06-29 (×3): 1 via ORAL
  Filled 2012-06-29 (×3): qty 1

## 2012-06-29 NOTE — Progress Notes (Signed)
Dr. Marice Potter updated on pt status, orders received to check pt cervix and notify with exam

## 2012-06-29 NOTE — Progress Notes (Signed)
Patient ID: Natalie Duffy, female   DOB: February 13, 1981, 32 y.o.   MRN: [id number] Patient ID: Natalie Duffy, female DOB: 1980/08/14, 32 y.o. MRN: [id number]  FACULTY PRACTICE ANTEPARTUM(COMPREHENSIVE) NOTE  Natalie Duffy is a 32 y.o. [id number] at [redacted]w[redacted]d by best clinical estimate who is admitted for short cervix.  Fetal presentation is cephalic.  Length of Stay: 3 Days  Subjective:  No complaints  Patient reports the fetal movement as active.  Patient reports uterine contraction activity as painful, waking her from sleep, requesting pain meds. Patient reports vaginal bleeding as none.  Patient describes fluid per vagina as None.  Vitals: Blood pressure 110/54, pulse 106, temperature 98.1 F (36.7 C), temperature source Oral, resp. rate 18, height 5\' 3"  (1.6 m), weight 122 lb (55.339 kg), last menstrual period 11/18/2011.  Physical Examination:  General appearance - alert, well appearing, and in no distress  Heart - normal rate and regular rhythm  Abdomen - soft, nontender, nondistended  Fundal Height: size equals dates  Cervical Exam: 1/th/high Extremities: extremities normal, atraumatic, no cyanosis or edema and Homans sign is negative, no sign of DVT with DTRs 2+ bilaterally  Membranes:intact  Fetal Monitoring: Baseline: 130-140s bpm, Variability: Good {> 6 bpm) and Accelerations: Reactive , irregular contractions. Labs:  No results found for this or any previous visit (from the past 24 hour(s)).  Imaging Studies:  Cephalic and cx 2 cm long  Medications: Scheduled  .  betamethasone acetate-betamethasone sodium phosphate  12 mg  Intramuscular  Once   .  docusate sodium  100 mg  Oral  Daily   .  indomethacin  25 mg  Oral  Q6H   .  pantoprazole  40 mg  Oral  Daily   .  prenatal multivitamin  1 tablet  Oral  Daily   I have reviewed the patient's current medications.  ASSESSMENT:  Patient Active Problem List   Diagnosis   .  SYNCOPE   .  PALPITATIONS   .  Smoker   .  Migraine    .  Supervision of other normal pregnancy   .  Previous cesarean delivery, antepartum condition or complication   .  Previous preterm delivery, antepartum   .  Cervical shortening   .  History of conization of cervix   .  Anxiety disorder   .  Threatened preterm labor, antepartum    A/P 32 y.o. weeks EGA s/p BMZ x 2, magnesium, indocin (finishing 72 hours today). She is contracting but not changing her cervix. Expectant management.

## 2012-06-30 DIAGNOSIS — O99330 Smoking (tobacco) complicating pregnancy, unspecified trimester: Secondary | ICD-10-CM

## 2012-06-30 DIAGNOSIS — O4700 False labor before 37 completed weeks of gestation, unspecified trimester: Secondary | ICD-10-CM

## 2012-06-30 DIAGNOSIS — O34219 Maternal care for unspecified type scar from previous cesarean delivery: Secondary | ICD-10-CM

## 2012-06-30 DIAGNOSIS — O26879 Cervical shortening, unspecified trimester: Secondary | ICD-10-CM

## 2012-06-30 MED ORDER — PRENATAL MULTIVITAMIN CH: 1.0000 | ORAL_TABLET | Freq: Every day | ORAL | Status: DC

## 2012-06-30 MED ORDER — CYCLOBENZAPRINE HCL 10 MG PO TABS: 10.0000 mg | ORAL_TABLET | Freq: Three times a day (TID) | ORAL | Status: DC | PRN

## 2012-06-30 NOTE — Discharge Summary (Signed)
Physician Discharge Summary  Patient ID: Natalie Duffy MRN: [id number] DOB/AGE: 06/13/1980 32 y.o.  Admit date: 06/26/2012 Discharge date: 06/30/2012  Admission Diagnoses: Shortened cervix; Preterm labor  Discharge Diagnoses:  Principal Problem:   Threatened preterm labor, antepartum Active Problems:   Cervical shortening   Discharged Condition: stable  Hospital Course: Pt admitted for preterm labor and shortened cervix.  Pt s/p BMZ x 2 and Magnesium sulfate for neuro prophylaxis.  She was to be discharged yesterday but, had ctx in the am.  Has no contractions currently.  She denies LOF or VB.  Reports + FM Consults: MFM  Treatments: IV hydration, analgesia: acetaminophen w/ codeine and BMZ and Magnesium sulfate  Discharge Exam: Blood pressure 120/61, pulse 87, temperature 98.7 F (37.1 C), temperature source Oral, resp. rate 16, height 5\' 3"  (1.6 m), weight 122 lb (55.339 kg), last menstrual period 11/18/2011, SpO2 96.00%. General appearance: alert and no distress GI: soft, non-tender; bowel sounds normal; no masses,  no organomegaly and gravid FHR: +beat to beat variablilty.  Cat 1 Toco: NO Contractions Disposition: 01-Home or Self Care  Discharge Orders   Future Appointments Provider Department Dept Phone   07/01/2012 11:00 AM [facility] [phone]   Future Orders Complete By Expires     Discharge activity:  Up to eat  As directed     Discharge diet:  No restrictions  As directed     Do not have sex or do anything that might make you have an orgasm  As directed     Notify physician for a general feeling that "something is not right"  As directed     Notify physician for increase or change in vaginal discharge  As directed     Notify physician for intestinal cramps, with or without diarrhea, sometimes described as "gas pain"  As directed     Notify physician for leaking of fluid  As directed     Notify physician for low,  dull backache, unrelieved by heat or Tylenol  As directed     Notify physician for menstrual like cramps  As directed     Notify physician for pelvic pressure  As directed     Notify physician for uterine contractions.  These may be painless and feel like the uterus is tightening or the baby is  "balling up"  As directed     Notify physician for vaginal bleeding  As directed     PRETERM LABOR:  Includes any of the follwing symptoms that occur between 20 - [redacted] weeks gestation.  If these symptoms are not stopped, preterm labor can result in preterm delivery, placing your baby at risk  As directed         Medication List    STOP taking these medications       acetaminophen 500 MG tablet  Commonly known as:  TYLENOL      TAKE these medications       cyclobenzaprine 10 MG tablet  Commonly known as:  FLEXERIL  Take 1 tablet (10 mg total) by mouth 3 (three) times daily as needed for muscle spasms.     prenatal multivitamin Tabs  Take 1 tablet by mouth daily.     prenatal multivitamin Tabs  Take 1 tablet by mouth daily.     promethazine 25 MG tablet  Commonly known as:  PHENERGAN  Take 1 tablet (25 mg total) by mouth every 6 (six) hours as needed for nausea.  zolpidem 10 MG tablet  Commonly known as:  AMBIEN  Take 1 tablet (10 mg total) by mouth at bedtime as needed for sleep.           Follow-up Information   Follow up with [facility].   Contact information:   [address] [place] [address] [phone]     Pt has appt at [facility] in am Signed: Willodean Rosenthal 06/30/2012, 8:53 AM

## 2012-06-30 NOTE — Progress Notes (Signed)
D/c home, instructions reviewed, verbalized understanding

## 2012-07-01 ENCOUNTER — Ambulatory Visit: Payer: Self-pay

## 2012-07-03 ENCOUNTER — Encounter: Payer: Self-pay | Admitting: Obstetrics and Gynecology

## 2012-07-03 ENCOUNTER — Ambulatory Visit (INDEPENDENT_AMBULATORY_CARE_PROVIDER_SITE_OTHER): Payer: Medicaid Other | Admitting: Obstetrics and Gynecology

## 2012-07-03 ENCOUNTER — Ambulatory Visit: Payer: Self-pay | Admitting: Obstetrics and Gynecology

## 2012-07-03 VITALS — BP 128/80 | HR 110 | Wt 126.0 lb

## 2012-07-03 DIAGNOSIS — F419 Anxiety disorder, unspecified: Secondary | ICD-10-CM

## 2012-07-03 DIAGNOSIS — O34219 Maternal care for unspecified type scar from previous cesarean delivery: Secondary | ICD-10-CM

## 2012-07-03 DIAGNOSIS — O26872 Cervical shortening, second trimester: Secondary | ICD-10-CM

## 2012-07-03 DIAGNOSIS — O26879 Cervical shortening, unspecified trimester: Secondary | ICD-10-CM

## 2012-07-03 DIAGNOSIS — O09219 Supervision of pregnancy with history of pre-term labor, unspecified trimester: Secondary | ICD-10-CM

## 2012-07-03 DIAGNOSIS — O09299 Supervision of pregnancy with other poor reproductive or obstetric history, unspecified trimester: Secondary | ICD-10-CM

## 2012-07-03 DIAGNOSIS — Z9889 Other specified postprocedural states: Secondary | ICD-10-CM

## 2012-07-03 DIAGNOSIS — F411 Generalized anxiety disorder: Secondary | ICD-10-CM

## 2012-07-03 DIAGNOSIS — O4700 False labor before 37 completed weeks of gestation, unspecified trimester: Secondary | ICD-10-CM

## 2012-07-03 DIAGNOSIS — Z3480 Encounter for supervision of other normal pregnancy, unspecified trimester: Secondary | ICD-10-CM

## 2012-07-03 MED ORDER — HYDROXYPROGESTERONE CAPROATE 250 MG/ML IM OIL
250.0000 mg | TOPICAL_OIL | Freq: Once | INTRAMUSCULAR | Status: AC
  Administered 2012-07-03: 250 mg via INTRAMUSCULAR

## 2012-07-03 NOTE — Progress Notes (Signed)
Patient doing well without complaints. Report persistent irregular contratcions, unchanged from previous. FM/PTL precautions reviewed. Continue weekly 17-P. Patient with persistent anxiety, declined referral to behavioral health. Desires sterilization- BTL papers signed today

## 2012-07-08 ENCOUNTER — Ambulatory Visit (INDEPENDENT_AMBULATORY_CARE_PROVIDER_SITE_OTHER): Payer: Medicaid Other | Admitting: *Deleted

## 2012-07-08 DIAGNOSIS — O09219 Supervision of pregnancy with history of pre-term labor, unspecified trimester: Secondary | ICD-10-CM

## 2012-07-08 MED ORDER — MEDROXYPROGESTERONE ACETATE 150 MG/ML IM SUSP
150.0000 mg | Freq: Once | INTRAMUSCULAR | Status: AC
  Administered 2012-07-08: 150 mg via INTRAMUSCULAR

## 2012-07-08 NOTE — Progress Notes (Signed)
Patient is here today for 17-P and to have increased discharge checked.   The discharge is white and lotiony in consistency, the PH indicator showed no sign of her water being broke. There was no pooling in the speculum.  She will follow up for her regular appointment with the physician and continue to rest.

## 2012-07-15 ENCOUNTER — Ambulatory Visit (INDEPENDENT_AMBULATORY_CARE_PROVIDER_SITE_OTHER): Payer: Medicaid Other | Admitting: Obstetrics and Gynecology

## 2012-07-15 ENCOUNTER — Encounter: Payer: Self-pay | Admitting: Obstetrics and Gynecology

## 2012-07-15 VITALS — BP 128/83 | Wt 121.0 lb

## 2012-07-15 DIAGNOSIS — O09213 Supervision of pregnancy with history of pre-term labor, third trimester: Secondary | ICD-10-CM

## 2012-07-15 LAB — POCT URINALYSIS DIPSTICK
Bilirubin, UA: NEGATIVE
Glucose, UA: NEGATIVE
Ketones, UA: NEGATIVE
Nitrite, UA: NEGATIVE
Protein, UA: NEGATIVE
Spec Grav, UA: 1.015
Urobilinogen, UA: NEGATIVE
pH, UA: 6

## 2012-07-15 MED ORDER — CEPHALEXIN 500 MG PO CAPS: 500.0000 mg | ORAL_CAPSULE | Freq: Four times a day (QID) | ORAL | Status: DC

## 2012-07-15 MED ORDER — HYDROXYPROGESTERONE CAPROATE 250 MG/ML IM OIL
250.0000 mg | TOPICAL_OIL | Freq: Once | INTRAMUSCULAR | Status: AC
  Administered 2012-07-15: 250 mg via INTRAMUSCULAR

## 2012-07-15 NOTE — Progress Notes (Signed)
Patient doing well. Reports urgency and frequency with inability to completely empty bladder. UA- positive leuk, and blood. Will treat with keflex. FM/PTL precautions reviewed. Continue weekly 17-p. Urine culture sent.

## 2012-07-15 NOTE — Progress Notes (Signed)
Still having irregular contractions.  She is having trouble peeing, feels the urge but nothing comes out, started in the middle of the night last night.  Very small sample given today.

## 2012-07-17 LAB — CULTURE, OB URINE
Colony Count: NO GROWTH
Organism ID, Bacteria: NO GROWTH

## 2012-07-19 ENCOUNTER — Inpatient Hospital Stay (HOSPITAL_COMMUNITY)
Admission: AD | Admit: 2012-07-19 | Discharge: 2012-07-19 | Disposition: A | Discharge status: Home / Self Care | Payer: Medicaid Other | Source: Ambulatory Visit | Attending: Obstetrics and Gynecology | Admitting: Obstetrics and Gynecology

## 2012-07-19 ENCOUNTER — Encounter (HOSPITAL_COMMUNITY): Payer: Self-pay | Admitting: *Deleted

## 2012-07-19 DIAGNOSIS — O479 False labor, unspecified: Secondary | ICD-10-CM

## 2012-07-19 DIAGNOSIS — O4700 False labor before 37 completed weeks of gestation, unspecified trimester: Secondary | ICD-10-CM | POA: Insufficient documentation

## 2012-07-19 LAB — URINALYSIS, ROUTINE W REFLEX MICROSCOPIC
Bilirubin Urine: NEGATIVE
Hgb urine dipstick: NEGATIVE
Ketones, ur: NEGATIVE mg/dL
Nitrite: NEGATIVE
Protein, ur: NEGATIVE mg/dL
Specific Gravity, Urine: 1.02 (ref 1.005–1.030)
Urobilinogen, UA: 0.2 mg/dL (ref 0.0–1.0)

## 2012-07-19 MED ORDER — OXYCODONE-ACETAMINOPHEN 5-325 MG PO TABS
1.0000 | ORAL_TABLET | ORAL | Status: DC | PRN
Start: 2012-07-19 — End: 2012-08-02

## 2012-07-19 NOTE — MAU Provider Note (Signed)
History     CSN: [id number]  Arrival date and time: 07/19/12 0909   None     Chief Complaint  Patient presents with  . Labor Eval   HPI This is a 32 y.o. female at [redacted]w[redacted]d who presents with c/o contractions since this morning. Denies leaking or bleeding. History is remarkable for preterm deliveries at 49 and 34 weeks.   Cannot tolerate Procardia.  Had flushing when she took it once. Not on any tocolytics  RN Note: Regular contractions, every few minutes since early this morning. Hx of PTL and deliveries, is not on any medications- unable to take them. Denies any bleeding or leaking.      OB History   Grav Para Term Preterm Abortions TAB SAB Ect Mult Living   5 3 1 2 1  1   3       Past Medical History  Diagnosis Date  . Migraine   . Insomnia   . Abnormal Pap smear 2004  . Cancer 2004    cervical  . Interstitial cystitis     Past Surgical History  Procedure Laterality Date  . Leep  2004  . [facility] bx  2004  . Cystoscopy  2007  . Cesarean section  2005  . Hysteroscopy  2007    Family History  Problem Relation Age of Onset  . Hypertension Father   . Cancer Maternal Grandmother     lung/brain  . Cancer Paternal Grandfather     lung/prostate cancer    History  Substance Use Topics  . Smoking status: Former Smoker -- 0.25 packs/day for 15 years    Types: Cigarettes    Quit date: 01/15/2012  . Smokeless tobacco: Never Used  . Alcohol Use: No     Comment: seldom    Allergies:  Allergies  Allergen Reactions  . Vancomycin Anaphylaxis  . Procardia (Nifedipine) Swelling  . Z-Pak (Azithromycin)     Reaction unknown   . Amoxicillin Rash  . Clindamycin/Lincomycin Rash  . Penicillins Rash  . Sulfur Rash    Prescriptions prior to admission  Medication Sig Dispense Refill  . cephALEXin (KEFLEX) 500 MG capsule Take 500 mg by mouth 4 (four) times daily. Pt started on 07/15/12. To take for 7 days.      . cyclobenzaprine (FLEXERIL) 10 MG tablet Take 1 tablet  (10 mg total) by mouth 3 (three) times daily as needed for muscle spasms.  30 tablet  1  . Prenatal Vit-Fe Fumarate-FA (PRENATAL MULTIVITAMIN) TABS Take 1 tablet by mouth daily.  30 tablet  3  . promethazine (PHENERGAN) 25 MG tablet Take 1 tablet (25 mg total) by mouth every 6 (six) hours as needed for nausea.  30 tablet  2  . zolpidem (AMBIEN) 10 MG tablet Take 10 mg by mouth at bedtime as needed for sleep.        Review of Systems  Constitutional: Negative for fever, chills and malaise/fatigue.  Cardiovascular: Negative for chest pain.  Gastrointestinal: Positive for abdominal pain. Negative for nausea, vomiting, diarrhea and constipation.  Genitourinary: Negative for dysuria.  Musculoskeletal: Negative for myalgias.   Physical Exam   Blood pressure 126/79, pulse 89, temperature 98.1 F (36.7 C), temperature source Oral, resp. rate 18, last menstrual period 11/18/2011.  Physical Exam  Constitutional: She is oriented to person, place, and time. She appears well-developed and well-nourished. No distress.  HENT:  Head: Normocephalic.  Cardiovascular: Normal rate.   Respiratory: Effort normal.  GI: Soft. She exhibits no distension and  no mass. There is no tenderness. There is no rebound and no guarding.  Genitourinary: Vagina normal and uterus normal. No vaginal discharge found.  Fetal heart rate reactive UCs every 3-5 minutes Dilation: 1 Effacement (%): 80 Station: -3 Exam by:: Dorrene German RN   Musculoskeletal: Normal range of motion.  Neurological: She is alert and oriented to person, place, and time.  Skin: Skin is warm and dry.  Psychiatric: She has a normal mood and affect.   Dilation: 1 Effacement (%): 80 Station: -3 Exam by:: Dorrene German RN No change from over an hour ago (checked at 1005 and 1135) MAU Course  Procedures  MDM Dilation: 1 Effacement (%): 80 Station: -3 Exam by:: Dorrene German RN Done at 1300 hrs  Assessment and Plan  A:  [facility] at [redacted]w[redacted]d       Preterm  contractions without cervical change. Patient has been watched and rechecked over 3 hours.   P:  Discharge home       Labor precautions      Will give Rx for pain so she can get some sleep  [facility] 07/19/2012, 11:38 AM

## 2012-07-19 NOTE — MAU Note (Signed)
Pt dx'd with UTI, started on cephalexin on Monday.

## 2012-07-19 NOTE — MAU Note (Signed)
Regular contractions, every few minutes since early this morning. Hx of PTL and deliveries, is not on any medications- unable to take them.  Denies any bleeding or leaking.

## 2012-07-22 ENCOUNTER — Ambulatory Visit: Payer: Medicaid Other

## 2012-07-24 NOTE — MAU Provider Note (Signed)
Attestation of Attending Supervision of Advanced Practitioner (CNM/NP): Evaluation and management procedures were performed by the Advanced Practitioner under my supervision and collaboration.  I have reviewed the Advanced Practitioner's note and chart, and I agree with the management and plan.  Chastidy Ranker 07/24/2012 9:25 AM   

## 2012-07-29 ENCOUNTER — Ambulatory Visit (INDEPENDENT_AMBULATORY_CARE_PROVIDER_SITE_OTHER): Payer: Medicaid Other | Admitting: Obstetrics & Gynecology

## 2012-07-29 VITALS — BP 121/80 | Wt 127.8 lb

## 2012-07-29 DIAGNOSIS — O09213 Supervision of pregnancy with history of pre-term labor, third trimester: Secondary | ICD-10-CM

## 2012-07-29 DIAGNOSIS — O09219 Supervision of pregnancy with history of pre-term labor, unspecified trimester: Secondary | ICD-10-CM

## 2012-07-29 DIAGNOSIS — O09299 Supervision of pregnancy with other poor reproductive or obstetric history, unspecified trimester: Secondary | ICD-10-CM

## 2012-07-29 DIAGNOSIS — O34219 Maternal care for unspecified type scar from previous cesarean delivery: Secondary | ICD-10-CM

## 2012-07-29 MED ORDER — HYDROXYPROGESTERONE CAPROATE 250 MG/ML IM OIL
250.0000 mg | TOPICAL_OIL | Freq: Once | INTRAMUSCULAR | Status: AC
  Administered 2012-07-29: 250 mg via INTRAMUSCULAR

## 2012-07-29 NOTE — Patient Instructions (Signed)
Return to clinic for any obstetric concerns or go to MAU for evaluation  

## 2012-07-29 NOTE — Progress Notes (Signed)
Pulse 86.  Patient reports spotting since yesterday and painful contractions. No loss of fluid.  Good fetal movement. Cervix 3/80/-3; patient does not feel her contractions are regular or are as strong.  She will observe them and go to the hospital for regular contractions, increasing pelvic pressure, bleeding, leaking of fluid, decreased fetal movement or other indications.  Will receive 17P injection today; last dose next week if still pregnant.  Expectant management for now. She desires TOLAC, has history of one VBAC.

## 2012-08-02 ENCOUNTER — Inpatient Hospital Stay (HOSPITAL_COMMUNITY)
Admission: AD | Admit: 2012-08-02 | Discharge: 2012-08-02 | Disposition: A | Discharge status: Home / Self Care | Payer: Medicaid Other | Source: Ambulatory Visit | Attending: Obstetrics & Gynecology | Admitting: Obstetrics & Gynecology

## 2012-08-02 ENCOUNTER — Encounter (HOSPITAL_COMMUNITY): Payer: Self-pay | Admitting: *Deleted

## 2012-08-02 DIAGNOSIS — O4700 False labor before 37 completed weeks of gestation, unspecified trimester: Secondary | ICD-10-CM | POA: Insufficient documentation

## 2012-08-02 DIAGNOSIS — O133 Gestational [pregnancy-induced] hypertension without significant proteinuria, third trimester: Secondary | ICD-10-CM

## 2012-08-02 DIAGNOSIS — O212 Late vomiting of pregnancy: Secondary | ICD-10-CM | POA: Insufficient documentation

## 2012-08-02 HISTORY — DX: Calculus of kidney: N20.0

## 2012-08-02 HISTORY — DX: Urinary tract infection, site not specified: N39.0

## 2012-08-02 HISTORY — DX: Tubulo-interstitial nephritis, not specified as acute or chronic: N12

## 2012-08-02 LAB — COMPREHENSIVE METABOLIC PANEL
AST: 11 U/L (ref 0–37)
Albumin: 2.5 g/dL — ABNORMAL LOW (ref 3.5–5.2)
BUN: 3 mg/dL — ABNORMAL LOW (ref 6–23)
CO2: 23 mEq/L (ref 19–32)
Calcium: 8.4 mg/dL (ref 8.4–10.5)
Chloride: 101 mEq/L (ref 96–112)
Creatinine, Ser: 0.4 mg/dL — ABNORMAL LOW (ref 0.50–1.10)
GFR calc non Af Amer: >90 mL/min (ref >90)
Total Bilirubin: 0.7 mg/dL (ref 0.3–1.2)

## 2012-08-02 LAB — URINE MICROSCOPIC-ADD ON

## 2012-08-02 LAB — URINALYSIS, ROUTINE W REFLEX MICROSCOPIC
Bilirubin Urine: NEGATIVE
Glucose, UA: NEGATIVE mg/dL
Hgb urine dipstick: NEGATIVE
Protein, ur: NEGATIVE mg/dL
Urobilinogen, UA: 0.2 mg/dL (ref 0.0–1.0)

## 2012-08-02 LAB — CBC
HCT: 27.3 % — ABNORMAL LOW (ref 36.0–46.0)
MCH: 25.6 pg — ABNORMAL LOW (ref 26.0–34.0)
MCV: 77.8 fL — ABNORMAL LOW (ref 78.0–100.0)
Platelets: 185 10*3/uL (ref 150–400)
RDW: 13.3 % (ref 11.5–15.5)

## 2012-08-02 LAB — PROTEIN / CREATININE RATIO, URINE: Protein Creatinine Ratio: 0.21 — ABNORMAL HIGH (ref 0.00–0.15)

## 2012-08-02 MED ORDER — DEXTROSE 5 % IN LACTATED RINGERS IV BOLUS
500.0000 mL | Freq: Once | INTRAVENOUS | Status: AC
  Administered 2012-08-02: 17:00:00 via INTRAVENOUS

## 2012-08-02 MED ORDER — ONDANSETRON 4 MG PO TBDP
4.0000 mg | ORAL_TABLET | Freq: Once | ORAL | Status: AC
  Administered 2012-08-02: 4 mg via ORAL
  Filled 2012-08-02: qty 1

## 2012-08-02 MED ORDER — ONDANSETRON 4 MG PO TBDP: 4.0000 mg | ORAL_TABLET | Freq: Three times a day (TID) | ORAL | Status: DC | PRN

## 2012-08-02 NOTE — MAU Provider Note (Signed)
History     CSN: [id number]  Arrival date and time: 08/02/12 1532   None     Chief Complaint  Patient presents with  . Labor Eval   HPI 32 y/o [id number] here at 36.0 with nausea, vomiting, and contractions. She has been having contractions for a couple of weeks but they began to get stronger yesterday. Her nausea started 2 nights ago, and has continued since then, she's had approx 3 episodes this morning and approx 5 episodes of dry heaves since. She last ate at breakfast this morning and vomited afterwards. She had some ginger ale of the way here which caused dry heaves. She had 3 episodes of loose stools at the onset of the illness that has resolved since then. She denies headches, vision change, RUQ pain, dypnea, chest pain, dysuria, and swelling. She also denies vaginal bleeding, LOF, discharge, and decreased fetal movement. She gets her care at [facility].  OB History   Grav Para Term Preterm Abortions TAB SAB Ect Mult Living   5 3 1 2 1  1   3       Past Medical History  Diagnosis Date  . Migraine   . Insomnia   . Abnormal Pap smear 2004  . Cancer 2004    cervical  . Interstitial cystitis   . Urinary tract infection   . Pyelonephritis   . Kidney stones     Past Surgical History  Procedure Laterality Date  . Leep  2004  . [facility] bx  2004  . Cystoscopy  2007  . Cesarean section  2005  . Hysteroscopy  2007  . Dilation and curettage of uterus      Family History  Problem Relation Age of Onset  . Hypertension Father   . Cancer Maternal Grandmother     lung/brain  . Cancer Paternal Grandfather     lung/prostate cancer    History  Substance Use Topics  . Smoking status: Former Smoker -- 0.25 packs/day for 15 years    Types: Cigarettes    Quit date: 01/15/2012  . Smokeless tobacco: Never Used  . Alcohol Use: No     Comment: seldom    Allergies:  Allergies  Allergen Reactions  . Vancomycin Anaphylaxis  . Procardia (Nifedipine) Swelling  . Z-Pak  (Azithromycin) Other (See Comments)    Reaction unknown; a childhood allergy.   . Amoxicillin Rash  . Clindamycin/Lincomycin Rash  . Penicillins Rash  . Sulfur Rash    Prescriptions prior to admission  Medication Sig Dispense Refill  . calcium carbonate (TUMS - DOSED IN MG ELEMENTAL CALCIUM) 500 MG chewable tablet Chew 2 tablets by mouth 3 (three) times daily.      . Prenatal Vit-Fe Fumarate-FA (PRENATAL MULTIVITAMIN) TABS Take 2 tablets by mouth at bedtime.      . promethazine (PHENERGAN) 25 MG tablet Take 1 tablet (25 mg total) by mouth every 6 (six) hours as needed for nausea.  30 tablet  2  . ranitidine (ZANTAC) 150 MG tablet Take 150 mg by mouth daily.      Marland Kitchen zolpidem (AMBIEN) 10 MG tablet Take 10 mg by mouth at bedtime as needed for sleep.      . [DISCONTINUED] Prenatal Vit-Fe Fumarate-FA (PRENATAL MULTIVITAMIN) TABS Take 1 tablet by mouth daily.  30 tablet  3    ROS Per HPI Physical Exam   Blood pressure 136/94, pulse 96, temperature 98.2 F (36.8 C), temperature source Oral, resp. rate 16, last menstrual period 11/18/2011.  Physical  Exam Physical Exam Gen: NAD, alert, cooperative with exam HEENT: NCAT, MMM CV: RRR, good S1/S2, no murmur Resp: CTABL, no wheezes, non-labored Abd: Soft, non-tender pregnant abdomen Ext: No edema Neuro: Alert and oriented, No gross deficits  Dilation: 3 Effacement (%): 80 Cervical Position: Posterior Exam by:: Dr Ermalinda Memos  FHT: Baseline 130, moderate variability, accels present,. No decels Toco: regular q 4 minutes  MAU Course  Procedures  Results for orders placed during the hospital encounter of 08/02/12 (from the past 24 hour(s))  URINALYSIS, ROUTINE W REFLEX MICROSCOPIC     Status: Abnormal   Collection Time    08/02/12  4:18 PM      Result Value Range   Color, Urine YELLOW  YELLOW   APPearance HAZY (*) CLEAR   Specific Gravity, Urine 1.010  1.005 - 1.030   pH 7.0  5.0 - 8.0   Glucose, UA NEGATIVE  NEGATIVE mg/dL   Hgb  urine dipstick NEGATIVE  NEGATIVE   Bilirubin Urine NEGATIVE  NEGATIVE   Ketones, ur 15 (*) NEGATIVE mg/dL   Protein, ur NEGATIVE  NEGATIVE mg/dL   Urobilinogen, UA 0.2  0.0 - 1.0 mg/dL   Nitrite NEGATIVE  NEGATIVE   Leukocytes, UA MODERATE (*) NEGATIVE  URINE MICROSCOPIC-ADD ON     Status: Abnormal   Collection Time    08/02/12  4:18 PM      Result Value Range   Squamous Epithelial / LPF MANY (*) RARE   WBC, UA 11-20  <3 WBC/hpf   Urine-Other RARE YEAST    PROTEIN / CREATININE RATIO, URINE     Status: Abnormal   Collection Time    08/02/12  4:18 PM      Result Value Range   Creatinine, Urine 153.93     Total Protein, Urine 31.7     PROTEIN CREATININE RATIO 0.21 (*) 0.00 - 0.15  CBC     Status: Abnormal   Collection Time    08/02/12  4:57 PM      Result Value Range   WBC 10.5  4.0 - 10.5 K/uL   RBC 3.51 (*) 3.87 - 5.11 MIL/uL   Hemoglobin 9.0 (*) 12.0 - 15.0 g/dL   HCT 30.8 (*) 65.7 - 84.6 %   MCV 77.8 (*) 78.0 - 100.0 fL   MCH 25.6 (*) 26.0 - 34.0 pg   MCHC 33.0  30.0 - 36.0 g/dL   RDW 96.2  95.2 - 84.1 %   Platelets 185  150 - 400 K/uL  COMPREHENSIVE METABOLIC PANEL     Status: Abnormal   Collection Time    08/02/12  4:57 PM      Result Value Range   Sodium 135  135 - 145 mEq/L   Potassium 3.5  3.5 - 5.1 mEq/L   Chloride 101  96 - 112 mEq/L   CO2 23  19 - 32 mEq/L   Glucose, Bld 102 (*) 70 - 99 mg/dL   BUN 3 (*) 6 - 23 mg/dL   Creatinine, Ser 3.24 (*) 0.50 - 1.10 mg/dL   Calcium 8.4  8.4 - 40.1 mg/dL   Total Protein 5.5 (*) 6.0 - 8.3 g/dL   Albumin 2.5 (*) 3.5 - 5.2 g/dL   AST 11  0 - 37 U/L   ALT 6  0 - 35 U/L   Alkaline Phosphatase 179 (*) 39 - 117 U/L   Total Bilirubin 0.7  0.3 - 1.2 mg/dL   GFR calc non Af Amer >90  >90 mL/min  GFR calc Af Amer >90  >90 mL/min    Assessment and Plan  31 y/o [id number] here with nausea and vomiting, also found to have increased diastiolic blood pressures. - pre-eclampsia work up negative, advised patient to go to [facility] on Monday for BP check.  - Moderate leukocytes (with many squams) but pt had a recent course of Keflex so we will defer Treatment and await results of cultures - contractions not productive with unchanged cervix from 4 days ago.  - Nausea improved with fluids (1 L D5LR) and zofran - Reviewed labor precautions, f/u in 3 days for BP check and then per routine at [facility].   Kevin Fenton 08/02/2012, 7:48 PM   I saw and examined patient along with student and agree with above note.   FRAZIER,NATALIE 08/08/2012 7:25 AM

## 2012-08-02 NOTE — MAU Note (Signed)
Patient states she is having contractions every 5-7 minutes. Denies bleeding or leaking and reports good fetal movement. Has vomited multiple times today.

## 2012-08-02 NOTE — MAU Note (Signed)
Ongoing nausea and vomiting- started 2 days ago, diarrhea the first night.  No one else at home is sick.  Has been having irreg/occ contractions past couple wks.

## 2012-08-02 NOTE — MAU Note (Signed)
Name and DOB verified, pt confirms spelling is correct on ID band. 

## 2012-08-04 LAB — URINE CULTURE: Colony Count: NO GROWTH

## 2012-08-05 ENCOUNTER — Ambulatory Visit (INDEPENDENT_AMBULATORY_CARE_PROVIDER_SITE_OTHER): Payer: Medicaid Other | Admitting: Obstetrics & Gynecology

## 2012-08-05 ENCOUNTER — Encounter: Payer: Self-pay | Admitting: Obstetrics & Gynecology

## 2012-08-05 VITALS — BP 139/83 | Wt 130.0 lb

## 2012-08-05 LAB — OB RESULTS CONSOLE GBS: GBS: NEGATIVE

## 2012-08-05 NOTE — Progress Notes (Signed)
Routine OB visit. Good FM. Labor precautions reviewed. She declines her last 17 P shot today. Cervical cultures done today.

## 2012-08-07 ENCOUNTER — Encounter: Payer: Medicaid Other | Admitting: Obstetrics and Gynecology

## 2012-08-08 LAB — CULTURE, BETA STREP (GROUP B ONLY)

## 2012-08-08 NOTE — MAU Provider Note (Signed)
Chart reviewed and agree with management and plan.  

## 2012-08-09 ENCOUNTER — Inpatient Hospital Stay (HOSPITAL_COMMUNITY)
Admission: AD | Admit: 2012-08-09 | Discharge: 2012-08-09 | Disposition: A | Discharge status: Home / Self Care | Payer: Medicaid Other | Source: Ambulatory Visit | Attending: Obstetrics & Gynecology | Admitting: Obstetrics & Gynecology

## 2012-08-09 DIAGNOSIS — R292 Abnormal reflex: Secondary | ICD-10-CM | POA: Insufficient documentation

## 2012-08-09 DIAGNOSIS — F411 Generalized anxiety disorder: Secondary | ICD-10-CM

## 2012-08-09 DIAGNOSIS — R51 Headache: Secondary | ICD-10-CM | POA: Insufficient documentation

## 2012-08-09 DIAGNOSIS — F419 Anxiety disorder, unspecified: Secondary | ICD-10-CM

## 2012-08-09 DIAGNOSIS — O479 False labor, unspecified: Secondary | ICD-10-CM | POA: Insufficient documentation

## 2012-08-09 DIAGNOSIS — O26872 Cervical shortening, second trimester: Secondary | ICD-10-CM

## 2012-08-09 DIAGNOSIS — R030 Elevated blood-pressure reading, without diagnosis of hypertension: Secondary | ICD-10-CM | POA: Insufficient documentation

## 2012-08-09 DIAGNOSIS — O99891 Other specified diseases and conditions complicating pregnancy: Secondary | ICD-10-CM | POA: Insufficient documentation

## 2012-08-09 LAB — COMPREHENSIVE METABOLIC PANEL
CO2: 21 mEq/L (ref 19–32)
Calcium: 8.7 mg/dL (ref 8.4–10.5)
Chloride: 103 mEq/L (ref 96–112)
Creatinine, Ser: 0.45 mg/dL — ABNORMAL LOW (ref 0.50–1.10)
GFR calc Af Amer: >90 mL/min (ref >90)
GFR calc non Af Amer: >90 mL/min (ref >90)
Glucose, Bld: 82 mg/dL (ref 70–99)
Total Bilirubin: 0.8 mg/dL (ref 0.3–1.2)

## 2012-08-09 LAB — CBC
HCT: 28.5 % — ABNORMAL LOW (ref 36.0–46.0)
Hemoglobin: 9.3 g/dL — ABNORMAL LOW (ref 12.0–15.0)
MCH: 25.2 pg — ABNORMAL LOW (ref 26.0–34.0)
MCV: 77.2 fL — ABNORMAL LOW (ref 78.0–100.0)
RBC: 3.69 MIL/uL — ABNORMAL LOW (ref 3.87–5.11)
WBC: 12.3 10*3/uL — ABNORMAL HIGH (ref 4.0–10.5)

## 2012-08-09 LAB — PROTEIN / CREATININE RATIO, URINE
Protein Creatinine Ratio: 0.14 (ref 0.00–0.15)
Total Protein, Urine: 13 mg/dL

## 2012-08-09 MED ORDER — NALBUPHINE HCL 10 MG/ML IJ SOLN
5.0000 mg | Freq: Once | INTRAMUSCULAR | Status: AC
  Administered 2012-08-09: 5 mg via SUBCUTANEOUS
  Filled 2012-08-09: qty 0.5

## 2012-08-09 NOTE — MAU Note (Signed)
Patient states she is having contractions every few minutes. Denies bleeding or leaking and reports good fetal movement.

## 2012-08-09 NOTE — MAU Provider Note (Signed)
History     CSN: [id number]  Arrival date and time: 08/09/12 4098   First Provider Initiated Contact with Patient 08/09/12 0940      Chief Complaint  Patient presents with  . Labor Eval   HPI 32 y/o [id number] at 37.0 with contractions. She state sthat contractions woke her form sleep this am at 1:30. They were initially a 5/10 and are now a 7/10 and slightly closer together. She denies vaginal discharge/LOF/bleeding/irritation, and decreased fetal movement. She has alos had a headache for 3 days that is similar to her migraines but did not respond to her imitrex. She had an episode of scotoma 2 nights ago which has now stopped. She denies edema and RUQ pain. She is currently having chest pressure/heaviness consistent with her previous panic attacks. She denies nausea, dyspnea, palpitattions, and dysuria.   OB History   Grav Para Term Preterm Abortions TAB SAB Ect Mult Living   5 3 1 2 1  1   3       Past Medical History  Diagnosis Date  . Migraine   . Insomnia   . Abnormal Pap smear 2004  . Cancer 2004    cervical  . Interstitial cystitis   . Urinary tract infection   . Pyelonephritis   . Kidney stones     Past Surgical History  Procedure Laterality Date  . Leep  2004  . [facility] bx  2004  . Cystoscopy  2007  . Cesarean section  2005  . Hysteroscopy  2007  . Dilation and curettage of uterus      Family History  Problem Relation Age of Onset  . Hypertension Father   . Cancer Maternal Grandmother     lung/brain  . Cancer Paternal Grandfather     lung/prostate cancer    History  Substance Use Topics  . Smoking status: Former Smoker -- 0.25 packs/day for 15 years    Types: Cigarettes    Quit date: 01/15/2012  . Smokeless tobacco: Never Used  . Alcohol Use: No     Comment: seldom    Allergies:  Allergies  Allergen Reactions  . Vancomycin Anaphylaxis  . Procardia (Nifedipine) Swelling  . Z-Pak (Azithromycin) Other (See Comments)    Reaction unknown; a  childhood allergy.   . Amoxicillin Rash  . Clindamycin/Lincomycin Rash  . Penicillins Rash  . Sulfur Rash    Prescriptions prior to admission  Medication Sig Dispense Refill  . Prenatal Vit-Fe Fumarate-FA (PRENATAL MULTIVITAMIN) TABS Take 2 tablets by mouth at bedtime.      . promethazine (PHENERGAN) 25 MG tablet Take 1 tablet (25 mg total) by mouth every 6 (six) hours as needed for nausea.  30 tablet  2  . zolpidem (AMBIEN) 10 MG tablet Take 10 mg by mouth at bedtime as needed for sleep.        ROS Per HPI Physical Exam   Blood pressure 143/95, pulse 94, temperature 98.1 F (36.7 C), temperature source Oral, resp. rate 16, height 5\' 4"  (1.626 m), weight 58.514 kg (129 lb), last menstrual period 11/18/2011, SpO2 100.00%.  Filed Vitals:   08/09/12 0926 08/09/12 0933 08/09/12 1034 08/09/12 1143  BP: 131/90 143/95 137/91 137/89  Pulse: 110 94 84 99  Temp:      TempSrc:      Resp:    16  Height:      Weight:      SpO2:         Physical Exam  Gen: NAD, alert,  cooperative with exam HEENT: NCAT, EOMI, PERRL CV: RRR, good S1/S2, no murmur Resp: CTABL, no wheezes, non-labored Abd: Soft, non tender pregnant abdmoen Ext: No edema Neuro: Alert and oriented, No gross deficits, 2-3+ DTRS, no clonus  FHT: Baseline 130, moderate variability, accels present, no decels Toco: q4-6 min  Dilation: 3 Effacement (%): 80 Station: Ballotable Exam by:: Dr. Ermalinda Memos   MAU Course  Procedures  Results for orders placed during the hospital encounter of 08/09/12 (from the past 24 hour(s))  CBC     Status: Abnormal   Collection Time    08/09/12  9:39 AM      Result Value Range   WBC 12.3 (*) 4.0 - 10.5 K/uL   RBC 3.69 (*) 3.87 - 5.11 MIL/uL   Hemoglobin 9.3 (*) 12.0 - 15.0 g/dL   HCT 16.1 (*) 09.6 - 04.5 %   MCV 77.2 (*) 78.0 - 100.0 fL   MCH 25.2 (*) 26.0 - 34.0 pg   MCHC 32.6  30.0 - 36.0 g/dL   RDW 40.9  81.1 - 91.4 %   Platelets 167  150 - 400 K/uL  COMPREHENSIVE METABOLIC  PANEL     Status: Abnormal   Collection Time    08/09/12  9:39 AM      Result Value Range   Sodium 135  135 - 145 mEq/L   Potassium 3.8  3.5 - 5.1 mEq/L   Chloride 103  96 - 112 mEq/L   CO2 21  19 - 32 mEq/L   Glucose, Bld 82  70 - 99 mg/dL   BUN 4 (*) 6 - 23 mg/dL   Creatinine, Ser 7.82 (*) 0.50 - 1.10 mg/dL   Calcium 8.7  8.4 - 95.6 mg/dL   Total Protein 5.7 (*) 6.0 - 8.3 g/dL   Albumin 2.6 (*) 3.5 - 5.2 g/dL   AST 12  0 - 37 U/L   ALT 7  0 - 35 U/L   Alkaline Phosphatase 202 (*) 39 - 117 U/L   Total Bilirubin 0.8  0.3 - 1.2 mg/dL   GFR calc non Af Amer >90  >90 mL/min   GFR calc Af Amer >90  >90 mL/min  PROTEIN / CREATININE RATIO, URINE     Status: None   Collection Time    08/09/12  9:40 AM      Result Value Range   Creatinine, Urine 94.40     Total Protein, Urine 13     PROTEIN CREATININE RATIO 0.14  0.00 - 0.15   Assessment and Plan  32 y/o [id number] here with contractions for a labor check - Given some elevated BPS, hyperreflexia and headache/scotoma, I checked pre-eclampsia labs which were negative - No cervical change despite regular contractions--> Early labor - DC home with labor precautions, f/u at [facility] as previously scheduled or return for labor/ROM  Kevin Fenton 08/09/2012, 10:02 AM   I saw and examined patient and reviewed prenatal record, labs, imaging, vitals. She is a patient at [facility] and has an appt on 3/25. Only one abnormal blood pressure. I personally reviewed fetal heart tracing, which was reactive. I also checked cervix prior to discharge and agree with resident findings. I agree with above note and plan to discharge home. Napoleon Form, MD

## 2012-08-12 ENCOUNTER — Encounter (HOSPITAL_COMMUNITY): Payer: Self-pay

## 2012-08-12 ENCOUNTER — Ambulatory Visit (INDEPENDENT_AMBULATORY_CARE_PROVIDER_SITE_OTHER): Payer: Medicaid Other | Admitting: Obstetrics & Gynecology

## 2012-08-12 ENCOUNTER — Encounter: Payer: Self-pay | Admitting: Obstetrics & Gynecology

## 2012-08-12 ENCOUNTER — Inpatient Hospital Stay (HOSPITAL_COMMUNITY)
Admission: AD | Admit: 2012-08-12 | Discharge: 2012-08-12 | Disposition: A | Discharge status: Home / Self Care | Payer: Medicaid Other | Source: Ambulatory Visit | Attending: Obstetrics & Gynecology | Admitting: Obstetrics & Gynecology

## 2012-08-12 VITALS — BP 128/86 | Wt 130.0 lb

## 2012-08-12 DIAGNOSIS — O471 False labor at or after 37 completed weeks of gestation: Secondary | ICD-10-CM

## 2012-08-12 DIAGNOSIS — Z3483 Encounter for supervision of other normal pregnancy, third trimester: Secondary | ICD-10-CM

## 2012-08-12 DIAGNOSIS — Z3480 Encounter for supervision of other normal pregnancy, unspecified trimester: Secondary | ICD-10-CM

## 2012-08-12 DIAGNOSIS — F419 Anxiety disorder, unspecified: Secondary | ICD-10-CM

## 2012-08-12 DIAGNOSIS — O26872 Cervical shortening, second trimester: Secondary | ICD-10-CM

## 2012-08-12 DIAGNOSIS — O34219 Maternal care for unspecified type scar from previous cesarean delivery: Secondary | ICD-10-CM

## 2012-08-12 DIAGNOSIS — O479 False labor, unspecified: Secondary | ICD-10-CM | POA: Insufficient documentation

## 2012-08-12 DIAGNOSIS — N949 Unspecified condition associated with female genital organs and menstrual cycle: Secondary | ICD-10-CM | POA: Insufficient documentation

## 2012-08-12 DIAGNOSIS — O09299 Supervision of pregnancy with other poor reproductive or obstetric history, unspecified trimester: Secondary | ICD-10-CM

## 2012-08-12 DIAGNOSIS — O09213 Supervision of pregnancy with history of pre-term labor, third trimester: Secondary | ICD-10-CM

## 2012-08-12 LAB — URINE MICROSCOPIC-ADD ON

## 2012-08-12 LAB — URINALYSIS, ROUTINE W REFLEX MICROSCOPIC
Bilirubin Urine: NEGATIVE
Glucose, UA: NEGATIVE mg/dL
Hgb urine dipstick: NEGATIVE
Ketones, ur: NEGATIVE mg/dL
Protein, ur: NEGATIVE mg/dL
pH: 5.5 (ref 5.0–8.0)

## 2012-08-12 NOTE — MAU Note (Signed)
Patient is in with c/p pelvic pressure/pain. She states that she have irregular contractions. She denies vaginal bleeding or lof.

## 2012-08-12 NOTE — MAU Provider Note (Signed)
History   32 y/o [id number] at [redacted]w[redacted]d with hx of emesis presenting with new onset vaginal pressure and pain   CSN: [id number]  Arrival date and time: 08/12/12 5621   None     Chief Complaint  Patient presents with  . Pelvic Pain   Pelvic Pain The patient's primary symptoms include pelvic pain. The patient's pertinent negatives include no genital itching, genital lesions, genital odor, genital rash, vaginal bleeding or vaginal discharge. This is a new problem. The current episode started yesterday. The problem occurs intermittently. The problem has been unchanged. The pain is mild. The problem affects both sides. She is pregnant. Associated symptoms include back pain and nausea. Pertinent negatives include no abdominal pain, anorexia, chills, constipation, diarrhea, dysuria, fever, frequency, headaches, hematuria, rash or vomiting. Nothing aggravates the symptoms. She has tried nothing for the symptoms. The treatment provided no relief. She is not sexually active. It is unknown whether or not her partner has an STD.    # False labor: Pt mentions 6/10 back and pelvic pain that that has been intermittent since yesterday morning.  Pt reports lost mucus plug yesterday with increasing pelvic pressure after loss.  Pt mentions continued fetal movement, denies large loss of fluid of discharge.  No recent vaginal itching or burning, no pain with urination or increased urinary frequency.     Pt goes to [facility]   OB History   Grav Para Term Preterm Abortions TAB SAB Ect Mult Living   5 3 1 2 1  1   3       Past Medical History  Diagnosis Date  . Migraine   . Insomnia   . Abnormal Pap smear 2004  . Cancer 2004    cervical  . Interstitial cystitis   . Urinary tract infection   . Pyelonephritis   . Kidney stones     Past Surgical History  Procedure Laterality Date  . Leep  2004  . Cone bx  2004  . Cystoscopy  2007  . Cesarean section  2005  . Hysteroscopy  2007  . Dilation and  curettage of uterus      Family History  Problem Relation Age of Onset  . Hypertension Father   . Cancer Maternal Grandmother     lung/brain  . Cancer Paternal Grandfather     lung/prostate cancer    History  Substance Use Topics  . Smoking status: Former Smoker -- 0.25 packs/day for 15 years    Types: Cigarettes    Quit date: 01/15/2012  . Smokeless tobacco: Never Used  . Alcohol Use: No     Comment: seldom    Allergies:  Allergies  Allergen Reactions  . Vancomycin Anaphylaxis  . Procardia (Nifedipine) Swelling  . Z-Pak (Azithromycin) Other (See Comments)    Reaction unknown; a childhood allergy.   . Amoxicillin Rash  . Clindamycin/Lincomycin Rash  . Penicillins Rash  . Sulfur Rash    Prescriptions prior to admission  Medication Sig Dispense Refill  . Prenatal Vit-Fe Fumarate-FA (PRENATAL MULTIVITAMIN) TABS Take 2 tablets by mouth at bedtime.      . promethazine (PHENERGAN) 25 MG tablet Take 1 tablet (25 mg total) by mouth every 6 (six) hours as needed for nausea.  30 tablet  2  . zolpidem (AMBIEN) 10 MG tablet Take 10 mg by mouth at bedtime as needed for sleep.        Review of Systems  Constitutional: Negative for fever and chills.  Eyes: Negative for blurred vision  and double vision.  Respiratory: Negative for cough, shortness of breath and wheezing.   Cardiovascular: Negative for chest pain, palpitations, claudication and leg swelling.  Gastrointestinal: Positive for nausea. Negative for heartburn, vomiting, abdominal pain, diarrhea, constipation and anorexia.  Genitourinary: Positive for pelvic pain. Negative for dysuria, frequency, hematuria and vaginal discharge.  Musculoskeletal: Positive for back pain. Negative for myalgias.  Skin: Negative for itching and rash.  Neurological: Negative for headaches.  Endo/Heme/Allergies: Does not bruise/bleed easily.  All other systems reviewed and are negative.   Physical Exam   Height 5\' 4"  (1.626 m), weight  59.194 kg (130 lb 8 oz), last menstrual period 11/18/2011.  Physical Exam  Constitutional: She is oriented to person, place, and time. She appears well-developed and well-nourished.  HENT:  Head: Normocephalic and atraumatic.  Eyes: EOM are normal. Pupils are equal, round, and reactive to light.  Neck: Normal range of motion. Neck supple.  Cardiovascular: Normal rate, regular rhythm, normal heart sounds and intact distal pulses.  Exam reveals no gallop and no friction rub.   No murmur heard. Respiratory: Breath sounds normal. No respiratory distress. She has no wheezes. She has no rales.  GI: Soft. Bowel sounds are normal. There is no tenderness. There is no rebound and no guarding.  Genitourinary:  Cervical exam: 58mc/blotable/-2/ soft  Musculoskeletal: Normal range of motion. She exhibits no edema.  Neurological: She is alert and oriented to person, place, and time.  Skin: Skin is dry.  Psychiatric: She has a normal mood and affect. Her behavior is normal. Thought content normal.    FHR: Baseline 135, accelerations present, no decelerations, no contractions, category 1 tracing  MAU Course  Procedures  MDM   Assessment and Plan   32 y/o [id number] at [redacted]w[redacted]d with hx of emesis presenting with new onset vaginal pressure and pain   # False labor: Pt presents with increased vaginal pressure and pain.  Cervical exam pt remains unchanged from previous week at 3cm/soft/ posterior/ blotable -- d/c to home -- f/u as scheduled -- tylenol prn pain   Gregor Hams 08/12/2012, 10:47 AM   Seen and agree with note Labor precautions  Wynelle Bourgeois CNM

## 2012-08-12 NOTE — Progress Notes (Signed)
Routine visit. Good FM.Complains of symptoms of latent labor. Reassurance given. I have recommended massage, warm shower, heating pad. Labor precautions reviewed. She was seen and checked at MAU today. She says that she wants a PPS and has signed her Medicaid forms.

## 2012-08-12 NOTE — Progress Notes (Signed)
P - 101 - Pt was seen at [facility] this morning for pressure in bottom with back pain 6/10

## 2012-08-13 ENCOUNTER — Encounter: Payer: Medicaid Other | Admitting: Obstetrics & Gynecology

## 2012-08-13 ENCOUNTER — Encounter (HOSPITAL_COMMUNITY): Payer: Self-pay

## 2012-08-13 ENCOUNTER — Inpatient Hospital Stay (HOSPITAL_COMMUNITY)
Admission: AD | Admit: 2012-08-13 | Discharge: 2012-08-15 | DRG: 767 | Disposition: A | Discharge status: Home / Self Care | Payer: Medicaid Other | Source: Ambulatory Visit | Attending: Family Medicine | Admitting: Family Medicine

## 2012-08-13 DIAGNOSIS — F419 Anxiety disorder, unspecified: Secondary | ICD-10-CM

## 2012-08-13 DIAGNOSIS — O26872 Cervical shortening, second trimester: Secondary | ICD-10-CM

## 2012-08-13 DIAGNOSIS — Z22330 Carrier of Group B streptococcus: Secondary | ICD-10-CM

## 2012-08-13 DIAGNOSIS — O99892 Other specified diseases and conditions complicating childbirth: Principal | ICD-10-CM | POA: Diagnosis present

## 2012-08-13 DIAGNOSIS — O0940 Supervision of pregnancy with grand multiparity, unspecified trimester: Secondary | ICD-10-CM

## 2012-08-13 DIAGNOSIS — O9989 Other specified diseases and conditions complicating pregnancy, childbirth and the puerperium: Secondary | ICD-10-CM

## 2012-08-13 DIAGNOSIS — Z302 Encounter for sterilization: Secondary | ICD-10-CM

## 2012-08-13 HISTORY — DX: Anxiety disorder, unspecified: F41.9

## 2012-08-13 LAB — COMPREHENSIVE METABOLIC PANEL
ALT: 8 U/L (ref 0–35)
AST: 15 U/L (ref 0–37)
Albumin: 2.8 g/dL — ABNORMAL LOW (ref 3.5–5.2)
Alkaline Phosphatase: 230 U/L — ABNORMAL HIGH (ref 39–117)
Chloride: 99 mEq/L (ref 96–112)
Creatinine, Ser: 0.4 mg/dL — ABNORMAL LOW (ref 0.50–1.10)
Potassium: 4 mEq/L (ref 3.5–5.1)
Sodium: 132 mEq/L — ABNORMAL LOW (ref 135–145)
Total Bilirubin: 0.8 mg/dL (ref 0.3–1.2)

## 2012-08-13 LAB — CBC
HCT: 29.5 % — ABNORMAL LOW (ref 36.0–46.0)
Hemoglobin: 9.5 g/dL — ABNORMAL LOW (ref 12.0–15.0)
MCHC: 32.2 g/dL (ref 30.0–36.0)
RBC: 3.88 MIL/uL (ref 3.87–5.11)
WBC: 11.4 10*3/uL — ABNORMAL HIGH (ref 4.0–10.5)

## 2012-08-13 LAB — TYPE AND SCREEN: ABO/RH(D): A POS

## 2012-08-13 MED ORDER — TERBUTALINE SULFATE 1 MG/ML IJ SOLN: 0.2500 mg | Freq: Once | INTRAMUSCULAR | Status: AC | PRN

## 2012-08-13 MED ORDER — OXYTOCIN 40 UNITS IN LACTATED RINGERS INFUSION - SIMPLE MED
1.0000 m[IU]/min | INTRAVENOUS | Status: DC
  Administered 2012-08-13: 4 m[IU]/min via INTRAVENOUS
  Administered 2012-08-13: 2 m[IU]/min via INTRAVENOUS
  Administered 2012-08-13: 8 m[IU]/min via INTRAVENOUS

## 2012-08-13 MED ORDER — CEFAZOLIN SODIUM-DEXTROSE 2-3 GM-% IV SOLR
2.0000 g | Freq: Once | INTRAVENOUS | Status: AC
  Administered 2012-08-13: 2 g via INTRAVENOUS
  Filled 2012-08-13: qty 50

## 2012-08-13 MED ORDER — PRENATAL MULTIVITAMIN CH
2.0000 | ORAL_TABLET | Freq: Every day | ORAL | Status: DC
  Filled 2012-08-13: qty 1

## 2012-08-13 MED ORDER — ONDANSETRON HCL 4 MG PO TABS
4.0000 mg | ORAL_TABLET | ORAL | Status: DC | PRN
  Administered 2012-08-15: 4 mg via ORAL
  Filled 2012-08-13: qty 1

## 2012-08-13 MED ORDER — ZOLPIDEM TARTRATE 5 MG PO TABS
5.0000 mg | ORAL_TABLET | Freq: Every evening | ORAL | Status: DC | PRN
  Administered 2012-08-15: 5 mg via ORAL

## 2012-08-13 MED ORDER — OXYTOCIN BOLUS FROM INFUSION: 500.0000 mL | INTRAVENOUS | Status: DC

## 2012-08-13 MED ORDER — OXYTOCIN 40 UNITS IN LACTATED RINGERS INFUSION - SIMPLE MED
62.5000 mL/h | INTRAVENOUS | Status: DC
  Filled 2012-08-13: qty 1000

## 2012-08-13 MED ORDER — LIDOCAINE HCL (PF) 1 % IJ SOLN
30.0000 mL | INTRAMUSCULAR | Status: DC | PRN
  Filled 2012-08-13 (×2): qty 30

## 2012-08-13 MED ORDER — LANOLIN HYDROUS EX OINT: TOPICAL_OINTMENT | CUTANEOUS | Status: DC | PRN

## 2012-08-13 MED ORDER — IBUPROFEN 600 MG PO TABS
600.0000 mg | ORAL_TABLET | Freq: Four times a day (QID) | ORAL | Status: DC | PRN
  Administered 2012-08-13 – 2012-08-14 (×3): 600 mg via ORAL
  Filled 2012-08-13 (×4): qty 1

## 2012-08-13 MED ORDER — PHENYLEPHRINE 40 MCG/ML (10ML) SYRINGE FOR IV PUSH (FOR BLOOD PRESSURE SUPPORT): 80.0000 ug | PREFILLED_SYRINGE | INTRAVENOUS | Status: DC | PRN

## 2012-08-13 MED ORDER — SIMETHICONE 80 MG PO CHEW: 80.0000 mg | CHEWABLE_TABLET | ORAL | Status: DC | PRN

## 2012-08-13 MED ORDER — OXYCODONE-ACETAMINOPHEN 5-325 MG PO TABS
1.0000 | ORAL_TABLET | ORAL | Status: DC | PRN
  Administered 2012-08-15: 1 via ORAL
  Filled 2012-08-13 (×4): qty 1

## 2012-08-13 MED ORDER — ONDANSETRON HCL 4 MG/2ML IJ SOLN: 4.0000 mg | INTRAMUSCULAR | Status: DC | PRN

## 2012-08-13 MED ORDER — PRENATAL MULTIVITAMIN CH
1.0000 | ORAL_TABLET | Freq: Every day | ORAL | Status: DC
  Administered 2012-08-14: 1 via ORAL

## 2012-08-13 MED ORDER — DIPHENHYDRAMINE HCL 50 MG/ML IJ SOLN: 12.5000 mg | INTRAMUSCULAR | Status: DC | PRN

## 2012-08-13 MED ORDER — ZOLPIDEM TARTRATE 5 MG PO TABS
10.0000 mg | ORAL_TABLET | Freq: Every evening | ORAL | Status: DC | PRN
  Filled 2012-08-13: qty 1

## 2012-08-13 MED ORDER — WITCH HAZEL-GLYCERIN EX PADS
1.0000 "application " | MEDICATED_PAD | CUTANEOUS | Status: DC | PRN
  Administered 2012-08-14: 1 via TOPICAL

## 2012-08-13 MED ORDER — ONDANSETRON HCL 4 MG/2ML IJ SOLN
4.0000 mg | Freq: Four times a day (QID) | INTRAMUSCULAR | Status: DC | PRN
  Administered 2012-08-13: 4 mg via INTRAVENOUS
  Filled 2012-08-13: qty 2

## 2012-08-13 MED ORDER — EPHEDRINE 5 MG/ML INJ: 10.0000 mg | INTRAVENOUS | Status: DC | PRN

## 2012-08-13 MED ORDER — LACTATED RINGERS IV SOLN: 500.0000 mL | Freq: Once | INTRAVENOUS | Status: DC

## 2012-08-13 MED ORDER — CEFAZOLIN SODIUM 1-5 GM-% IV SOLN
1.0000 g | Freq: Three times a day (TID) | INTRAVENOUS | Status: DC
  Filled 2012-08-13 (×2): qty 50

## 2012-08-13 MED ORDER — LACTATED RINGERS IV SOLN
INTRAVENOUS | Status: DC
  Administered 2012-08-13 – 2012-08-14 (×2): via INTRAVENOUS

## 2012-08-13 MED ORDER — DIBUCAINE 1 % RE OINT
1.0000 "application " | TOPICAL_OINTMENT | RECTAL | Status: DC | PRN
  Administered 2012-08-14: 1 via RECTAL
  Filled 2012-08-13 (×2): qty 28

## 2012-08-13 MED ORDER — LACTATED RINGERS IV SOLN: 500.0000 mL | INTRAVENOUS | Status: DC | PRN

## 2012-08-13 MED ORDER — DIPHENHYDRAMINE HCL 25 MG PO CAPS: 25.0000 mg | ORAL_CAPSULE | Freq: Four times a day (QID) | ORAL | Status: DC | PRN

## 2012-08-13 MED ORDER — TETANUS-DIPHTH-ACELL PERTUSSIS 5-2.5-18.5 LF-MCG/0.5 IM SUSP
0.5000 mL | Freq: Once | INTRAMUSCULAR | Status: DC
  Filled 2012-08-13: qty 0.5

## 2012-08-13 MED ORDER — FENTANYL CITRATE 0.05 MG/ML IJ SOLN
100.0000 ug | INTRAMUSCULAR | Status: DC | PRN
  Administered 2012-08-13: 100 ug via INTRAVENOUS
  Filled 2012-08-13: qty 2

## 2012-08-13 MED ORDER — CITRIC ACID-SODIUM CITRATE 334-500 MG/5ML PO SOLN: 30.0000 mL | ORAL | Status: DC | PRN

## 2012-08-13 MED ORDER — IBUPROFEN 600 MG PO TABS
600.0000 mg | ORAL_TABLET | Freq: Four times a day (QID) | ORAL | Status: DC
  Administered 2012-08-14 – 2012-08-15 (×4): 600 mg via ORAL
  Filled 2012-08-13 (×3): qty 1

## 2012-08-13 MED ORDER — FENTANYL 2.5 MCG/ML BUPIVACAINE 1/10 % EPIDURAL INFUSION (WH - ANES): 14.0000 mL/h | INTRAMUSCULAR | Status: DC | PRN

## 2012-08-13 MED ORDER — OXYCODONE-ACETAMINOPHEN 5-325 MG PO TABS
1.0000 | ORAL_TABLET | ORAL | Status: DC | PRN
  Administered 2012-08-13 – 2012-08-15 (×4): 1 via ORAL
  Filled 2012-08-13: qty 1

## 2012-08-13 MED ORDER — BENZOCAINE-MENTHOL 20-0.5 % EX AERO
1.0000 "application " | INHALATION_SPRAY | CUTANEOUS | Status: DC | PRN
  Administered 2012-08-14: 1 via TOPICAL
  Filled 2012-08-13 (×2): qty 56

## 2012-08-13 MED ORDER — ACETAMINOPHEN 325 MG PO TABS: 650.0000 mg | ORAL_TABLET | ORAL | Status: DC | PRN

## 2012-08-13 MED ORDER — SENNOSIDES-DOCUSATE SODIUM 8.6-50 MG PO TABS
2.0000 | ORAL_TABLET | Freq: Every day | ORAL | Status: DC
  Administered 2012-08-14: 2 via ORAL

## 2012-08-13 MED ORDER — PROMETHAZINE HCL 25 MG PO TABS: 25.0000 mg | ORAL_TABLET | Freq: Four times a day (QID) | ORAL | Status: DC | PRN

## 2012-08-13 NOTE — Progress Notes (Signed)
Natalie Duffy is a 32 y.o. [phone] at [redacted]w[redacted]d by LMP admitted for rupture of membranes  Subjective: Pt mentions increasing strength and intensity of contractions.  Pt reports continued fetal movement.    Objective: BP 147/76  Pulse 82  Temp(Src) 98.2 F (36.8 C) (Oral)  Resp 18  Ht 5\' 3"  (1.6 m)  Wt 58.968 kg (130 lb)  BMI 23.03 kg/m2  LMP 11/18/2011      FHT:  FHR: 135 bpm, variability: moderate,  accelerations:  Present,  decelerations:  Absent UC:   regular, every 4 minutes SVE:   Dilation: 3 Effacement (%): 90;80 Exam by:: Buddy Duty RN  Labs: Lab Results  Component Value Date   WBC 11.4* 08/13/2012   HGB 9.5* 08/13/2012   HCT 29.5* 08/13/2012   MCV 76.0* 08/13/2012   PLT 182 08/13/2012    Assessment / Plan: Augmentation of labor, progressing well -- continue pitocin  Labor: Progressing normally and on pitocin Fetal Wellbeing:  Category I Pain Control:  Labor support without medications I/D:  GBS positive treated with cefazolin Anticipated MOD:  TOLAC anticipate NSVD  Gregor Hams 08/13/2012, 7:26 PM

## 2012-08-13 NOTE — H&P (Signed)
Natalie Duffy is a 32 y.o. female presenting for SROM. Maternal Medical History:  Reason for admission: Rupture of membranes.  Nausea.  Contractions: Onset was 1-2 hours ago.   Frequency: regular.   Duration is approximately 1 minute.   Perceived severity is moderate.    Fetal activity: Perceived fetal activity is normal.   Last perceived fetal movement was within the past hour.    Prenatal complications: No bleeding, IUGR, oligohydramnios, polyhydramnios or pre-eclampsia.     # SROM: Pt mentions SROM at 1430 on day of admission.  Pt mentions continious episodic contraction pain lasting 30-45 seconds each every 3-5 minutes.  Pt presented to [facility] and was deameed to be ruptured.  Pt mentions continues fetal movement, no vaginal bleeding, or abdominal pain not associated with contractions.  Pt denies headache, vision changes, RUQ pain.    OB History   Grav Para Term Preterm Abortions TAB SAB Ect Mult Living   5 3 1 2 1  1   3      Past Medical History  Diagnosis Date  . Migraine   . Insomnia   . Abnormal Pap smear 2004  . Cancer 2004    cervical  . Interstitial cystitis   . Urinary tract infection   . Pyelonephritis   . Kidney stones   . Anxiety    Past Surgical History  Procedure Laterality Date  . Leep  2004  . Cone bx  2004  . Cystoscopy  2007  . Cesarean section  2005  . Hysteroscopy  2007  . Dilation and curettage of uterus     Family History: family history includes Cancer in her maternal grandmother and paternal grandfather and Hypertension in her father. Social History:  reports that she quit smoking about 6 months ago. Her smoking use included Cigarettes. She has a 3.75 pack-year smoking history. She has never used smokeless tobacco. She reports that she does not drink alcohol or use illicit drugs.   Prenatal Transfer Tool  Maternal Diabetes: No Genetic Screening: Normal Maternal Ultrasounds/Referrals: Normal Fetal Ultrasounds or other Referrals:   None Maternal Substance Abuse:  No Significant Maternal Medications:  None Significant Maternal Lab Results:  Lab values include: Group B Strep positive Other Comments:  Cefazolin for GBS prophylaxis  Review of Systems  Constitutional: Negative for fever and chills.  Eyes: Negative for blurred vision and double vision.  Respiratory: Negative for cough, shortness of breath and wheezing.   Cardiovascular: Negative for chest pain, palpitations, claudication and leg swelling.  Gastrointestinal: Negative for heartburn, nausea, vomiting, abdominal pain and diarrhea.  Genitourinary: Negative for dysuria, urgency and frequency.  Musculoskeletal: Negative for myalgias and joint pain.  Skin: Negative for itching and rash.  Neurological: Negative for dizziness, tingling and headaches.  Endo/Heme/Allergies: Does not bruise/bleed easily.  All other systems reviewed and are negative.    Dilation: 3 Effacement (%): 90;80 Exam by:: Buddy Duty RN Last menstrual period 11/18/2011. Maternal Exam:  Uterine Assessment: Contraction strength is mild.  Contraction duration is 1 minute. Contraction frequency is regular.   Abdomen: Fetal presentation: vertex  Introitus: Normal vulva. Vagina is positive for vaginal discharge (clear vaginal discharge).  Ferning test: not done.  Nitrazine test: not done. Amniotic fluid character: clear.  Cervix: Cervix evaluated by digital exam.     Fetal Exam Fetal Monitor Review: Mode: ultrasound.   Baseline rate: 130.  Variability: moderate (6-25 bpm).   Pattern: accelerations present and no decelerations.    Fetal State Assessment: Category I -  tracings are normal.     Physical Exam  Vitals reviewed. Constitutional: She is oriented to person, place, and time. She appears well-developed and well-nourished. No distress.  HENT:  Head: Normocephalic and atraumatic.  Eyes: EOM are normal. Pupils are equal, round, and reactive to light.  Neck: Normal  range of motion. Neck supple.  Cardiovascular: Normal rate, regular rhythm and normal heart sounds.  Exam reveals no gallop and no friction rub.   No murmur heard. Respiratory: Effort normal and breath sounds normal. No respiratory distress. She has no wheezes. She has no rales.  GI: Soft. Bowel sounds are normal. She exhibits distension. There is no tenderness. There is no rebound, no guarding and no CVA tenderness.  Genitourinary: Vaginal discharge (clear vaginal discharge) found.  Cervical exam: 3/80%/Soft/ Posterior  Musculoskeletal: Normal range of motion. She exhibits no edema.  Neurological: She is alert and oriented to person, place, and time.  Skin: Skin is warm and dry. She is not diaphoretic.  Psychiatric: She has a normal mood and affect. Her behavior is normal. Thought content normal.    Prenatal labs: ABO, Rh: --/--/A POS, A POS (02/05 1935) Antibody: NEG (02/05 1935) Rubella: 20.9 (08/07 1416) RPR: NON REAC (01/13 1401)  HBsAg: NEGATIVE (08/07 1416)  HIV: NON REACTIVE (01/13 1401)  GBS:   Positive   Assessment/Plan: [name] is a 32 y.o. Female [id number] at [redacted]w[redacted]d  presenting with SROM.  # SOL: Pt presents with SROM, with cervical dilation to 3 cm with profuse clear vaginal discharge -- GBS positive -- Cefazolin for GBS + status, pt has extensive abx allergies -- pitocin for augmentation -- pt request epidural prn  DISPO: Diet: thin Activity: up ab lib Nursing: Per floor protocol Electrolytes: replace as needed Prophylaxis: GI: none; DVT early ambulation Code: Full  Ancipitate the pt to be admitted for 2 midnight(s) for anticipated NSVD.      Gregor Hams 08/13/2012, 5:56 PM Evaluation and management procedures were performed by Resident physician under my supervision/collaboration. Chart reviewed, patient examined by me and I agree with management and plan.

## 2012-08-14 ENCOUNTER — Encounter (HOSPITAL_COMMUNITY): Payer: Self-pay | Admitting: Anesthesiology

## 2012-08-14 ENCOUNTER — Encounter (HOSPITAL_COMMUNITY)
Admission: AD | Disposition: A | Discharge status: Home / Self Care | Payer: Self-pay | Source: Ambulatory Visit | Attending: Family Medicine

## 2012-08-14 ENCOUNTER — Inpatient Hospital Stay (HOSPITAL_COMMUNITY): Payer: Medicaid Other | Admitting: Anesthesiology

## 2012-08-14 DIAGNOSIS — O4700 False labor before 37 completed weeks of gestation, unspecified trimester: Secondary | ICD-10-CM

## 2012-08-14 HISTORY — PX: TUBAL LIGATION: SHX77

## 2012-08-14 LAB — RPR: RPR Ser Ql: NONREACTIVE

## 2012-08-14 LAB — PROTEIN / CREATININE RATIO, URINE: Total Protein, Urine: 13.9 mg/dL

## 2012-08-14 SURGERY — LIGATION, FALLOPIAN TUBE, POSTPARTUM
Anesthesia: Spinal | Site: Abdomen | Laterality: Bilateral | Wound class: Clean Contaminated

## 2012-08-14 MED ORDER — ONDANSETRON HCL 4 MG/2ML IJ SOLN
INTRAMUSCULAR | Status: DC | PRN
  Administered 2012-08-14: 4 mg via INTRAVENOUS

## 2012-08-14 MED ORDER — LACTATED RINGERS IV SOLN
INTRAVENOUS | Status: DC
  Administered 2012-08-14: 125 mL/h via INTRAVENOUS
  Administered 2012-08-14: 09:00:00 via INTRAVENOUS

## 2012-08-14 MED ORDER — FENTANYL CITRATE 0.05 MG/ML IJ SOLN: 25.0000 ug | INTRAMUSCULAR | Status: DC | PRN

## 2012-08-14 MED ORDER — FENTANYL CITRATE 0.05 MG/ML IJ SOLN
INTRAMUSCULAR | Status: DC | PRN
  Administered 2012-08-14 (×4): 50 ug via INTRAVENOUS

## 2012-08-14 MED ORDER — KETOROLAC TROMETHAMINE 30 MG/ML IJ SOLN: 15.0000 mg | Freq: Once | INTRAMUSCULAR | Status: AC | PRN

## 2012-08-14 MED ORDER — BUPIVACAINE HCL (PF) 0.25 % IJ SOLN
INTRAMUSCULAR | Status: DC | PRN
  Administered 2012-08-14: 10 mL

## 2012-08-14 MED ORDER — BUPIVACAINE IN DEXTROSE 0.75-8.25 % IT SOLN
INTRATHECAL | Status: DC | PRN
  Administered 2012-08-14: 1.3 mL via INTRATHECAL

## 2012-08-14 MED ORDER — METOCLOPRAMIDE HCL 5 MG/ML IJ SOLN: 10.0000 mg | Freq: Once | INTRAMUSCULAR | Status: AC | PRN

## 2012-08-14 MED ORDER — FAMOTIDINE 20 MG PO TABS
40.0000 mg | ORAL_TABLET | Freq: Once | ORAL | Status: AC
  Administered 2012-08-14: 40 mg via ORAL
  Filled 2012-08-14: qty 2

## 2012-08-14 MED ORDER — MIDAZOLAM HCL 5 MG/5ML IJ SOLN
INTRAMUSCULAR | Status: DC | PRN
  Administered 2012-08-14: 1 mg via INTRAVENOUS
  Administered 2012-08-14: 2 mg via INTRAVENOUS
  Administered 2012-08-14: 1 mg via INTRAVENOUS

## 2012-08-14 MED ORDER — MIDAZOLAM HCL 2 MG/2ML IJ SOLN
INTRAMUSCULAR | Status: AC
  Filled 2012-08-14: qty 2

## 2012-08-14 MED ORDER — METOCLOPRAMIDE HCL 10 MG PO TABS
10.0000 mg | ORAL_TABLET | Freq: Once | ORAL | Status: AC
  Administered 2012-08-14: 10 mg via ORAL
  Filled 2012-08-14: qty 1

## 2012-08-14 MED ORDER — FENTANYL CITRATE 0.05 MG/ML IJ SOLN
INTRAMUSCULAR | Status: AC
  Filled 2012-08-14: qty 2

## 2012-08-14 MED ORDER — KETOROLAC TROMETHAMINE 30 MG/ML IJ SOLN
INTRAMUSCULAR | Status: DC | PRN
  Administered 2012-08-14: 30 mg via INTRAVENOUS

## 2012-08-14 MED ORDER — MEPERIDINE HCL 25 MG/ML IJ SOLN: 6.2500 mg | INTRAMUSCULAR | Status: DC | PRN

## 2012-08-14 MED ORDER — BUPIVACAINE HCL (PF) 0.25 % IJ SOLN
INTRAMUSCULAR | Status: AC
  Filled 2012-08-14: qty 30

## 2012-08-14 SURGICAL SUPPLY — 21 items
BLADE SURG 11 STRL SS (BLADE) ×2 IMPLANT
CHLORAPREP W/TINT 26ML (MISCELLANEOUS) ×2 IMPLANT
CLIP FILSHIE TUBAL LIGA STRL (Clip) ×3 IMPLANT
CLOTH BEACON ORANGE TIMEOUT ST (SAFETY) ×2 IMPLANT
GLOVE BIO SURGEON STRL SZ 6.5 (GLOVE) ×2 IMPLANT
GLOVE BIOGEL PI IND STRL 7.0 (GLOVE) ×1 IMPLANT
GLOVE BIOGEL PI INDICATOR 7.0 (GLOVE) ×1
GOWN PREVENTION PLUS LG XLONG (DISPOSABLE) ×4 IMPLANT
NDL HYPO 25X1 1.5 SAFETY (NEEDLE) ×1 IMPLANT
NEEDLE HYPO 25X1 1.5 SAFETY (NEEDLE) ×2 IMPLANT
NS IRRIG 1000ML POUR BTL (IV SOLUTION) ×2 IMPLANT
PACK ABDOMINAL MINOR (CUSTOM PROCEDURE TRAY) ×2 IMPLANT
SPONGE GAUZE 2X2 8PLY STRL LF (GAUZE/BANDAGES/DRESSINGS) ×1 IMPLANT
SPONGE LAP 4X18 X RAY DECT (DISPOSABLE) IMPLANT
SUT VIC AB 0 CT1 27 (SUTURE) ×2
SUT VIC AB 0 CT1 27XBRD ANBCTR (SUTURE) ×1 IMPLANT
SUT VICRYL 4-0 PS2 18IN ABS (SUTURE) ×2 IMPLANT
SYR CONTROL 10ML LL (SYRINGE) ×2 IMPLANT
TOWEL OR 17X24 6PK STRL BLUE (TOWEL DISPOSABLE) ×4 IMPLANT
TRAY FOLEY BAG SILVER LF 14FR (CATHETERS) ×2 IMPLANT
WATER STERILE IRR 1000ML POUR (IV SOLUTION) ×2 IMPLANT

## 2012-08-14 NOTE — Op Note (Signed)
Natalie Duffy 08/13/2012 - 08/14/2012  PREOPERATIVE DIAGNOSIS:  Multiparity, undesired fertility  POSTOPERATIVE DIAGNOSIS:  Multiparity, undesired fertility  PROCEDURE:  Postpartum Bilateral Tubal Sterilization using Filshie Clips   ANESTHESIA:  Epidural  COMPLICATIONS:  None immediate.  ESTIMATED BLOOD LOSS:  Less than 20 ml.  URINE OUTPUT:  50 ml of clear urine.  INDICATIONS: 32 y.o. [id number]  with undesired fertility,status post vaginal delivery, desires permanent sterilization. Risks and benefits of procedure discussed with patient including permanence of method, bleeding, infection, injury to surrounding organs and need for additional procedures. Risk failure of 0.5-1% with increased risk of ectopic gestation if pregnancy occurs was also discussed with patient.   FINDINGS:  Normal uterus, tubes, and ovaries.  TECHNIQUE:  The patient was taken to the operating room where her spinal anesthesia was dosed up to surgical level and found to be adequate.  She was then placed in the dorsal supine position and prepped and draped in sterile fashion.  After an adequate timeout was performed, attention was turned to the patient's abdomen. 0.25 % Marcaine 10 ml was infiltrated at the umbilicus. A small transverse skin incision was made under the umbilical fold. The incision was taken down to the layer of fascia using the scalpel, and fascia was incised, and extended bilaterally using Mayo scissors. The peritoneum was entered in a sharp fashion. Attention was then turned to the patient's uterus, and left fallopian tube was identified and followed out to the fimbriated end.  A Filshie clip was placed on the left fallopian tube about 2 cm from the cornual attachment, with care given to incorporate the underlying mesosalpinx.  A similar process was carried out on the right side allowing for bilateral tubal sterilization.  Good hemostasis was noted overall.The instruments were then removed from the  patient's abdomen and the fascial incision was repaired with 0 Vicryl, and the skin was closed with a 4-0 Vicryl subcuticular stitch. The patient tolerated the procedure well.  Sponge, lap, and needle counts were correct times two.  The patient was then taken to the recovery room awake, extubated and in stable condition.  Deepti Gunawan 08/14/2012 11:23 AM

## 2012-08-14 NOTE — Progress Notes (Signed)
Patient ID: Natalie Duffy, female   DOB: 11/03/1980, 32 y.o.   MRN: [id number] S/p BTL requests permanent sterilization, posted for BTL today. The procedure was discussed and the risk of anesthesia, failure, ectopic, bleeding, infection, visceral organ damage were discussed and her questions were answered. Consent signed.  Filed Vitals:   08/13/12 2355 08/14/12 0055 08/14/12 0435 08/14/12 0806  BP: 127/76 132/87 134/78 127/85  Pulse: 78 79 78 76  Temp: 98.5 F (36.9 C) 98.5 F (36.9 C) 98.4 F (36.9 C) 97.8 F (36.6 C)  TempSrc: Oral Oral Oral Oral  Resp: 20 20 20 20   Height:      Weight:      SpO2: 100% 100% 100%    Mirtha Jain 08/14/2012 9:57 AM

## 2012-08-14 NOTE — Progress Notes (Signed)
Post Partum Day 1 Subjective: no complaints, up ad lib, voiding and Mild headache, no scotoma or RUQ pain  Objective: Blood pressure 134/78, pulse 78, temperature 98.4 F (36.9 C), temperature source Oral, resp. rate 20, height 5\' 3"  (1.6 m), weight 58.968 kg (130 lb), last menstrual period 11/18/2011, SpO2 100.00%, unknown if currently breastfeeding.  Physical Exam:  General: alert, cooperative and no distress Lochia: appropriate Uterine Fundus: firm Incision: N/A DVT Evaluation: No evidence of DVT seen on physical exam. Negative Homan's sign. No cords or calf tenderness. Neuro: 3+ DTRs, 1 beat of clonus BL   Recent Labs  08/13/12 1730  HGB 9.5*  HCT 29.5*   Results for orders placed during the hospital encounter of 08/13/12 (from the past 24 hour(s))  CBC     Status: Abnormal   Collection Time    08/13/12  5:30 PM      Result Value Range   WBC 11.4 (*) 4.0 - 10.5 K/uL   RBC 3.88  3.87 - 5.11 MIL/uL   Hemoglobin 9.5 (*) 12.0 - 15.0 g/dL   HCT 16.1 (*) 09.6 - 04.5 %   MCV 76.0 (*) 78.0 - 100.0 fL   MCH 24.5 (*) 26.0 - 34.0 pg   MCHC 32.2  30.0 - 36.0 g/dL   RDW 40.9  81.1 - 91.4 %   Platelets 182  150 - 400 K/uL  RPR     Status: None   Collection Time    08/13/12  5:30 PM      Result Value Range   RPR NON REACTIVE  NON REACTIVE  TYPE AND SCREEN     Status: None   Collection Time    08/13/12  5:30 PM      Result Value Range   ABO/RH(D) A POS     Antibody Screen NEG     Sample Expiration 08/16/2012    COMPREHENSIVE METABOLIC PANEL     Status: Abnormal   Collection Time    08/13/12  6:00 PM      Result Value Range   Sodium 132 (*) 135 - 145 mEq/L   Potassium 4.0  3.5 - 5.1 mEq/L   Chloride 99  96 - 112 mEq/L   CO2 20  19 - 32 mEq/L   Glucose, Bld 76  70 - 99 mg/dL   BUN 5 (*) 6 - 23 mg/dL   Creatinine, Ser 7.82 (*) 0.50 - 1.10 mg/dL   Calcium 8.8  8.4 - 95.6 mg/dL   Total Protein 5.9 (*) 6.0 - 8.3 g/dL   Albumin 2.8 (*) 3.5 - 5.2 g/dL   AST 15  0 - 37 U/L    ALT 8  0 - 35 U/L   Alkaline Phosphatase 230 (*) 39 - 117 U/L   Total Bilirubin 0.8  0.3 - 1.2 mg/dL   GFR calc non Af Amer >90  >90 mL/min   GFR calc Af Amer >90  >90 mL/min  PROTEIN / CREATININE RATIO, URINE     Status: Abnormal   Collection Time    08/13/12 11:30 PM      Result Value Range   Creatinine, Urine 34.78     Total Protein, Urine 13.9     PROTEIN CREATININE RATIO 0.40 (*) 0.00 - 0.15  SURGICAL PCR SCREEN     Status: None   Collection Time    08/14/12  3:45 AM      Result Value Range   MRSA, PCR NEGATIVE  NEGATIVE   Staphylococcus aureus  NEGATIVE  NEGATIVE    Assessment/Plan: Plan for discharge tomorrow and Contraception BTL today Mild to moderate pre-eclampsia with some neurologic symptoms. Blood pressures have normalized since delivery. Will discuss treating with Mag and transferring to ICU with attending.    LOS: 1 day   Kevin Fenton 08/14/2012, 7:26 AM

## 2012-08-14 NOTE — Progress Notes (Signed)
I have seen and examined this patient and I agree with the above. Cam Hai 7:37 AM 08/14/2012

## 2012-08-14 NOTE — MAU Provider Note (Signed)
Attestation of Attending Supervision of Advanced Practitioner (CNM/NP): Evaluation and management procedures were performed by the Advanced Practitioner under my supervision and collaboration. I have reviewed the Advanced Practitioner's note and chart, and I agree with the management and plan.  Priyana Mccarey H. 12:01 PM   

## 2012-08-14 NOTE — Progress Notes (Signed)
Thirty day papers missing from chart.

## 2012-08-14 NOTE — Transfer of Care (Signed)
Immediate Anesthesia Transfer of Care Note  Patient: Natalie Duffy  Procedure(s) Performed: Procedure(s): POST PARTUM TUBAL LIGATION (Bilateral)  Patient Location: PACU  Anesthesia Type:Spinal  Level of Consciousness: awake, alert  and patient cooperative  Airway & Oxygen Therapy: Patient Spontanous Breathing  Post-op Assessment: Report given to PACU RN and Post -op Vital signs reviewed and stable  Post vital signs: stable  Complications: No apparent anesthesia complications

## 2012-08-14 NOTE — Anesthesia Procedure Notes (Signed)
Spinal  Patient location during procedure: OR Start time: 08/14/2012 10:35 AM Staffing Performed by: anesthesiologist  Preanesthetic Checklist Completed: patient identified, site marked, surgical consent, pre-op evaluation, timeout performed, IV checked, risks and benefits discussed and monitors and equipment checked Spinal Block Patient position: sitting Prep: site prepped and draped and DuraPrep Patient monitoring: heart rate, cardiac monitor, continuous pulse ox and blood pressure Approach: midline Location: L3-4 Injection technique: single-shot Needle Needle type: Sprotte  Needle gauge: 24 G Needle length: 9 cm Assessment Sensory level: T4 Additional Notes Clear free flow CSF on first attempt.  No paresthesia.  Patient tolerated procedure well with no apparent complications.  Jasmine December, MD

## 2012-08-14 NOTE — Anesthesia Preprocedure Evaluation (Addendum)
Anesthesia Evaluation  Patient identified by MRN, date of birth, ID band Patient awake    Reviewed: Allergy & Precautions, H&P , NPO status , Patient's Chart, lab work & pertinent test results, reviewed documented beta blocker date and time   History of Anesthesia Complications Negative for: history of anesthetic complications  Airway Mallampati: II TM Distance: >3 FB Neck ROM: full   Comment: Small mouth Dental  (+) Teeth Intact   Pulmonary former smoker (quit 8/13),  breath sounds clear to auscultation        Cardiovascular negative cardio ROS  Rhythm:regular Rate:Normal     Neuro/Psych  Headaches, PSYCHIATRIC DISORDERS (anxiety)    GI/Hepatic negative GI ROS, Neg liver ROS,   Endo/Other  negative endocrine ROS  Renal/GU negative Renal ROS Bladder dysfunction Female GU complaint (h/o cervical cancer)     Musculoskeletal   Abdominal   Peds  Hematology  (+) anemia ,   Anesthesia Other Findings   Reproductive/Obstetrics (+) Pregnancy (s/p SVD 3/25 for PPBTL)                           Anesthesia Physical Anesthesia Plan  ASA: II  Anesthesia Plan: Spinal   Post-op Pain Management:    Induction:   Airway Management Planned:   Additional Equipment:   Intra-op Plan:   Post-operative Plan:   Informed Consent: I have reviewed the patients History and Physical, chart, labs and discussed the procedure including the risks, benefits and alternatives for the proposed anesthesia with the patient or authorized representative who has indicated his/her understanding and acceptance.     Plan Discussed with: Surgeon and CRNA  Anesthesia Plan Comments:         Anesthesia Quick Evaluation

## 2012-08-14 NOTE — Progress Notes (Signed)
UR chart review completed.  

## 2012-08-14 NOTE — Anesthesia Postprocedure Evaluation (Signed)
  Anesthesia Post-op Note  Patient: Natalie Duffy  Procedure(s) Performed: Procedure(s): POST PARTUM TUBAL LIGATION (Bilateral)   Patient is awake, responsive, moving her legs, and has signs of resolution of her numbness. Pain and nausea are reasonably well controlled. Vital signs are stable and clinically acceptable. Oxygen saturation is clinically acceptable. There are no apparent anesthetic complications at this time. Patient is ready for discharge.

## 2012-08-15 ENCOUNTER — Encounter (HOSPITAL_COMMUNITY): Payer: Self-pay | Admitting: *Deleted

## 2012-08-15 MED ORDER — OXYCODONE-ACETAMINOPHEN 5-325 MG PO TABS: 1.0000 | ORAL_TABLET | ORAL | Status: DC | PRN

## 2012-08-15 MED ORDER — IBUPROFEN 600 MG PO TABS: 600.0000 mg | ORAL_TABLET | Freq: Four times a day (QID) | ORAL | Status: DC

## 2012-08-15 MED ORDER — HYDROCHLOROTHIAZIDE 12.5 MG PO CAPS: 12.5000 mg | ORAL_CAPSULE | Freq: Every day | ORAL | Status: DC

## 2012-08-15 MED ORDER — HYDROCHLOROTHIAZIDE 12.5 MG PO CAPS
12.5000 mg | ORAL_CAPSULE | Freq: Every day | ORAL | Status: DC
  Administered 2012-08-15: 12.5 mg via ORAL
  Filled 2012-08-15 (×2): qty 1

## 2012-08-15 NOTE — Discharge Summary (Signed)
I examined Natalie Duffy and agree with documentation above and resident plan of care.  Consulted with Dr. Jolayne Panther regarding VS and exam > DC to home on HCTZ with follow-up in office for blood pressure check next week. [facility]

## 2012-08-15 NOTE — Progress Notes (Signed)
Patient ID: Natalie Duffy, female   DOB: [birth year], 32 y.o.   MRN: [id number]  # GBS status: when asked about GBS status, pt verbally reported positive screening.  Pt was given one dose of ancef prior to delivery for presumed GBS positive status.   Upon further chart review a negative GBS status was found from 08/05/12.  No additional antibiotics were given.

## 2012-08-15 NOTE — H&P (Signed)
Chart reviewed and agree with management and plan.  

## 2012-08-15 NOTE — Clinical Social Work Psychosocial (Signed)
    Clinical Social Work Department BRIEF PSYCHOSOCIAL ASSESSMENT 08/15/2012  Patient:  Natalie Duffy, Natalie Duffy     Account Number:  [id number]     Admit date:  08/13/2012  Clinical Social Worker:  Melene Plan  Date/Time:  08/15/2012 12:59 PM  Referred by:  Physician  Date Referred:  08/15/2012 Referred for  Behavioral Health Issues   Other Referral:   Hx of anxiety   Interview type:  Patient Other interview type:    PSYCHOSOCIAL DATA Living Status:  PARENTS Admitted from facility:   Level of care:   Primary support name:  Desiree Hane, FOB Primary support relationship to patient:  FRIEND Degree of support available:   Involved    CURRENT CONCERNS Current Concerns  Behavioral Health Issues   Other Concerns:    SOCIAL WORK ASSESSMENT / PLAN CSW met with pt to assess history of anxiety disorder.  Pt told CSW that she has dealt with anxiety issues for "years."  She was taking Doxepin & Ativan for a long period of time before she stopped due to pregnancy.  According to the pt, she was able to cope well "for the most part," without the medication.  She denies any history of SI or PP depression.  Pt's affect appears to be flat but told CSW that she is tired.  FOB at the bedside & supportive.  Pt lives with FOB & her 3 other children.  Pt does not plan to restart the medication right away but will seek medical attention if symptoms are not manageable.  She has good family support & all the necessary supplies for the infant.  CSW discussed PP depression signs/symptoms & strongly encouraged follow up with physician as needed.  Pt agrees.   Assessment/plan status:  No Further Intervention Required Other assessment/ plan:   Information/referral to community resources:   PP depression literature provided    PATIENT'S/FAMILY'S RESPONSE TO PLAN OF CARE: Pt & FOB were receptive to information discussed.

## 2012-08-15 NOTE — Discharge Summary (Signed)
Obstetric Discharge Summary Reason for Admission: onset of labor and rupture of membranes Prenatal Procedures: none Intrapartum Procedures: spontaneous vaginal delivery Postpartum Procedures: P.P. tubal ligation and Mild Pre-eclampsia Complications-Operative and Postpartum: Mild pre-eclampsia Hemoglobin  Date Value Range Status  08/13/2012 9.5* 12.0 - 15.0 g/dL Final     HCT  Date Value Range Status  08/13/2012 29.5* 36.0 - 46.0 % Final    Physical Exam:  General: alert, cooperative and no distress Lochia: appropriate Uterine Fundus: firm Incision: N/a DVT Evaluation: No evidence of DVT seen on physical exam. Negative Homan's sign. No cords or calf tenderness. Neuro: 2 beast of clonus BL, 2-3 + DTRs  Discharge Diagnoses: Term Pregnancy-delivered and Mild pre-eclampsia, also s/p BTL  Discharge Information: Date: 08/15/2012 Activity: unrestricted Diet: routine Medications: PNV, Ibuprofen, Percocet and HCTZ 12.5 qd Condition: stable Instructions: refer to practice specific booklet Discharge to: home HCTZ for elevated blood pressures, follow up at [facility] in less than 1 week for BP check   Newborn Data: Live born female  Birth Weight: 5 lb 8.4 oz (2505 g) APGAR: 9, 9  Home with mother.  Natalie Duffy 08/15/2012, 8:53 AM

## 2012-08-16 ENCOUNTER — Other Ambulatory Visit: Payer: Medicaid Other | Admitting: *Deleted

## 2012-08-16 ENCOUNTER — Other Ambulatory Visit: Payer: Self-pay | Admitting: Obstetrics & Gynecology

## 2012-08-16 DIAGNOSIS — O1400 Mild to moderate pre-eclampsia, unspecified trimester: Secondary | ICD-10-CM

## 2012-08-16 MED ORDER — HYDROCHLOROTHIAZIDE 25 MG PO TABS: 25.0000 mg | ORAL_TABLET | Freq: Every day | ORAL | Status: DC

## 2012-08-16 NOTE — Progress Notes (Signed)
Patient is here for BP check and incision check.  Incision looks good and BP is up, Dr. Macon Large will increase her HCTZ to 25mg , patient will double up on her current 12.5 mg tablet and follow back up with [place] Monday for BP check.  She will proceed to hospital if her symptoms of severe headache, swelling, seeing spots returns.  Otherwise she is doing well and receiving plenty of help at home from the babies father and her mother.

## 2012-08-19 ENCOUNTER — Ambulatory Visit: Payer: Medicaid Other | Admitting: *Deleted

## 2012-08-19 VITALS — BP 132/86 | HR 86

## 2012-08-19 DIAGNOSIS — K6289 Other specified diseases of anus and rectum: Secondary | ICD-10-CM

## 2012-08-19 MED ORDER — HYDROCORTISONE ACETATE 25 MG RE SUPP: 25.0000 mg | Freq: Two times a day (BID) | RECTAL | Status: DC

## 2012-08-19 NOTE — Progress Notes (Signed)
Patient is here today for BP check.  She is tolerating the medication well and has had one episode of headache, but after resting the headache subsided.  She does have a history of migraines.  Her swelling has gone down.  She is resting as much as possible.  BP today is 132/86, she will follow up Wednesday morning for another BP check.  She will call if any symptoms return to be seen sooner.  She also asked for a refill of medication for her hemorrhoids.  Anusol HC sent to pharmacy.  Appointment with Bonita Quin for headaches will be made as well.

## 2012-08-21 ENCOUNTER — Ambulatory Visit (INDEPENDENT_AMBULATORY_CARE_PROVIDER_SITE_OTHER): Payer: Medicaid Other | Admitting: Obstetrics and Gynecology

## 2012-08-21 ENCOUNTER — Encounter: Payer: Self-pay | Admitting: Obstetrics and Gynecology

## 2012-08-21 VITALS — BP 136/96 | HR 76 | Ht 63.0 in | Wt 112.6 lb

## 2012-08-21 DIAGNOSIS — Z0130 Encounter for examination of blood pressure without abnormal findings: Secondary | ICD-10-CM

## 2012-08-21 DIAGNOSIS — Z136 Encounter for screening for cardiovascular disorders: Secondary | ICD-10-CM

## 2012-08-21 MED ORDER — AMLODIPINE BESYLATE 5 MG PO TABS: 5.0000 mg | ORAL_TABLET | Freq: Every day | ORAL | Status: DC

## 2012-08-21 NOTE — Progress Notes (Signed)
Patient ID: Natalie Duffy, female   DOB: Sep 04, 1980, 32 y.o.   MRN: [id number] Patient here for BP check without any complaints or symptoms. BP 136/96  Added Norvasc 5mg  daily  Follow-up at postpartum visit

## 2012-08-27 ENCOUNTER — Ambulatory Visit (INDEPENDENT_AMBULATORY_CARE_PROVIDER_SITE_OTHER): Payer: Medicaid Other | Admitting: Nurse Practitioner

## 2012-08-27 ENCOUNTER — Encounter: Payer: Self-pay | Admitting: Nurse Practitioner

## 2012-08-27 VITALS — BP 140/94 | HR 79 | Ht 63.0 in | Wt 111.2 lb

## 2012-08-27 DIAGNOSIS — G43909 Migraine, unspecified, not intractable, without status migrainosus: Secondary | ICD-10-CM

## 2012-08-27 MED ORDER — DOXEPIN HCL 100 MG PO CAPS: 100.0000 mg | ORAL_CAPSULE | Freq: Every day | ORAL | Status: DC

## 2012-08-27 MED ORDER — SUMATRIPTAN SUCCINATE 100 MG PO TABS: 100.0000 mg | ORAL_TABLET | Freq: Once | ORAL | Status: DC | PRN

## 2012-08-27 NOTE — Progress Notes (Signed)
Here today for headache consult.

## 2012-08-27 NOTE — Progress Notes (Signed)
S: Pt last seen for migraine Sept 2012. Since then she has had a baby girl named Tomasa Rand that is now 21 weeks old. She is not breast feeding. She also had a BTL.  She has developed HTN and been placed on Norvasc 5 mg. She is currently having 2 migraines per week and uses Imitrex for relief. She would like to resume her Doxepin for prevention and to help her with anxiety.  O: Alert, oriented, NAD Cardiac: RRR Lungs; Clear Skin: warm and dry  A: Migraine   P: Denied Ativan. Will start Doxepin at 100mg  nightly, she is advised to take 50mg  for one week and build up slowly. Refill Imitrex. RTC 3 months or prn

## 2012-08-27 NOTE — Patient Instructions (Signed)
Migraine Headache A migraine headache is an intense, throbbing pain on one or both sides of your head. A migraine can last for 30 minutes to several hours. CAUSES  The exact cause of a migraine headache is not always known. However, a migraine may be caused when nerves in the brain become irritated and release chemicals that cause inflammation. This causes pain. SYMPTOMS  Pain on one or both sides of your head.  Pulsating or throbbing pain.  Severe pain that prevents daily activities.  Pain that is aggravated by any physical activity.  Nausea, vomiting, or both.  Dizziness.  Pain with exposure to bright lights, loud noises, or activity.  General sensitivity to bright lights, loud noises, or smells. Before you get a migraine, you may get warning signs that a migraine is coming (aura). An aura may include:  Seeing flashing lights.  Seeing bright spots, halos, or zig-zag lines.  Having tunnel vision or blurred vision.  Having feelings of numbness or tingling.  Having trouble talking.  Having muscle weakness. MIGRAINE TRIGGERS  Alcohol.  Smoking.  Stress.  Menstruation.  Aged cheeses.  Foods or drinks that contain nitrates, glutamate, aspartame, or tyramine.  Lack of sleep.  Chocolate.  Caffeine.  Hunger.  Physical exertion.  Fatigue.  Medicines used to treat chest pain (nitroglycerine), birth control pills, estrogen, and some blood pressure medicines. DIAGNOSIS  A migraine headache is often diagnosed based on:  Symptoms.  Physical examination.  A CT scan or MRI of your head. TREATMENT Medicines may be given for pain and nausea. Medicines can also be given to help prevent recurrent migraines.  HOME CARE INSTRUCTIONS  Only take over-the-counter or prescription medicines for pain or discomfort as directed by your caregiver. The use of long-term narcotics is not recommended.  Lie down in a dark, quiet room when you have a migraine.  Keep a journal  to find out what may trigger your migraine headaches. For example, write down:  What you eat and drink.  How much sleep you get.  Any change to your diet or medicines.  Limit alcohol consumption.  Quit smoking if you smoke.  Get 7 to 9 hours of sleep, or as recommended by your caregiver.  Limit stress.  Keep lights dim if bright lights bother you and make your migraines worse. SEEK IMMEDIATE MEDICAL CARE IF:   Your migraine becomes severe.  You have a fever.  You have a stiff neck.  You have vision loss.  You have muscular weakness or loss of muscle control.  You start losing your balance or have trouble walking.  You feel faint or pass out.  You have severe symptoms that are different from your first symptoms. MAKE SURE YOU:   Understand these instructions.  Will watch your condition.  Will get help right away if you are not doing well or get worse. Document Released: 05/08/2005 Document Revised: 07/31/2011 Document Reviewed: 04/28/2011 [employer] Patient Information 2013 [employer].  

## 2012-09-20 ENCOUNTER — Encounter: Payer: Self-pay | Admitting: *Deleted

## 2012-10-10 ENCOUNTER — Encounter: Payer: Self-pay | Admitting: *Deleted

## 2012-10-15 ENCOUNTER — Ambulatory Visit: Payer: Medicaid Other | Admitting: Family Medicine

## 2013-01-15 ENCOUNTER — Emergency Department: Payer: Self-pay | Admitting: Internal Medicine

## 2013-01-15 LAB — URINALYSIS, COMPLETE
Bilirubin,UR: NEGATIVE
Glucose,UR: NEGATIVE mg/dL (ref 0–75)
Ketone: NEGATIVE
Leukocyte Esterase: NEGATIVE
Nitrite: NEGATIVE
Ph: 5 (ref 4.5–8.0)
Protein: NEGATIVE
Specific Gravity: 1.02 (ref 1.003–1.030)
Squamous Epithelial: 3
WBC UR: 4 /HPF (ref 0–5)

## 2013-01-15 LAB — WET PREP, GENITAL

## 2013-04-29 IMAGING — US US OB TRANSVAGINAL
1 series · 14 of 14 positions shown · non-contrast
Comparison: none

CLINICAL DATA: Contractions, evaluate cervical length

LIMITED OBSTETRIC ULTRASOUND
Number of Fetuses: 1
Heart Rate: 146 bpm
MATERNAL FINDINGS:
Cervix: Shortened, measuring 1.6 cm

[Series 1: us ob transvaginal · 14 acquisitions, 14 frames shown]
[im 1/14]
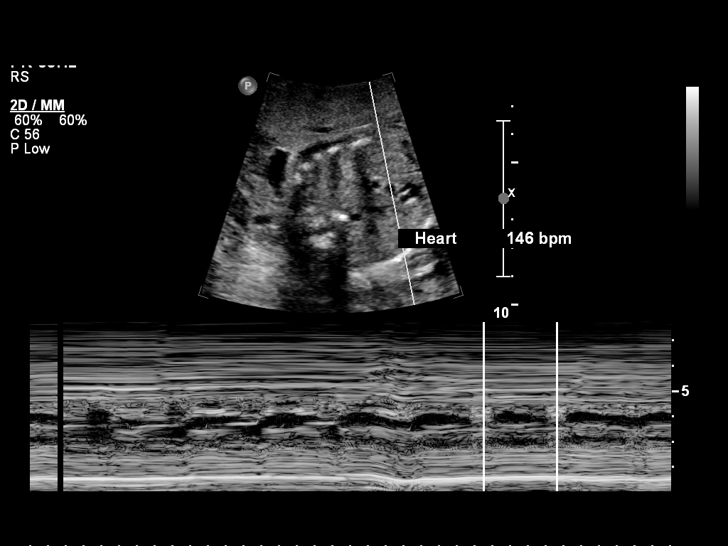
[im 2/14]
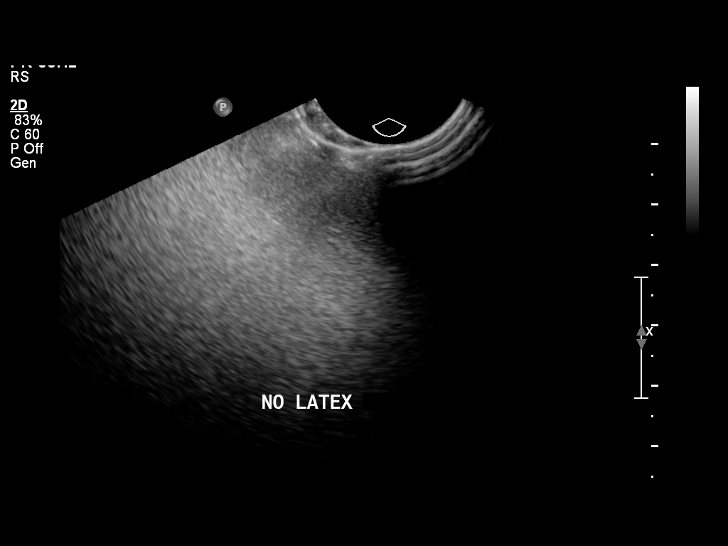
[im 3/14]
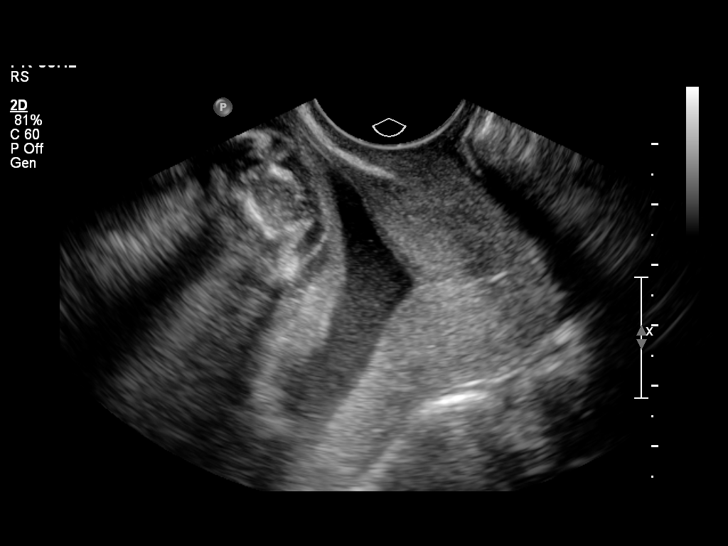
[im 4/14]
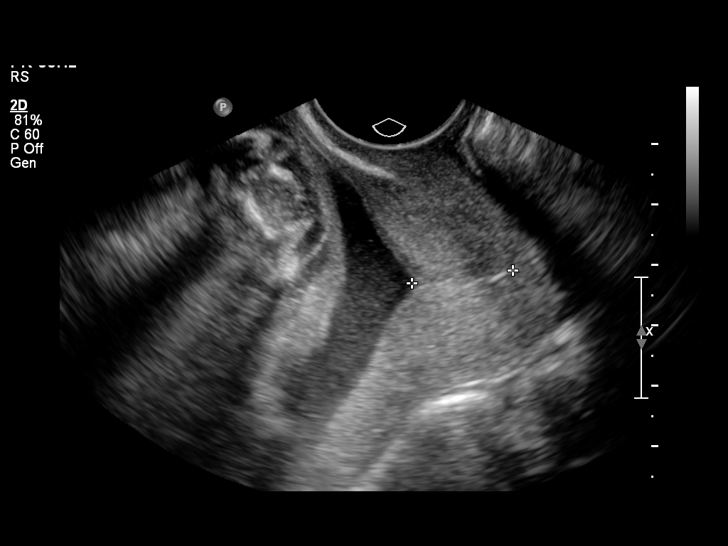
[im 5/14]
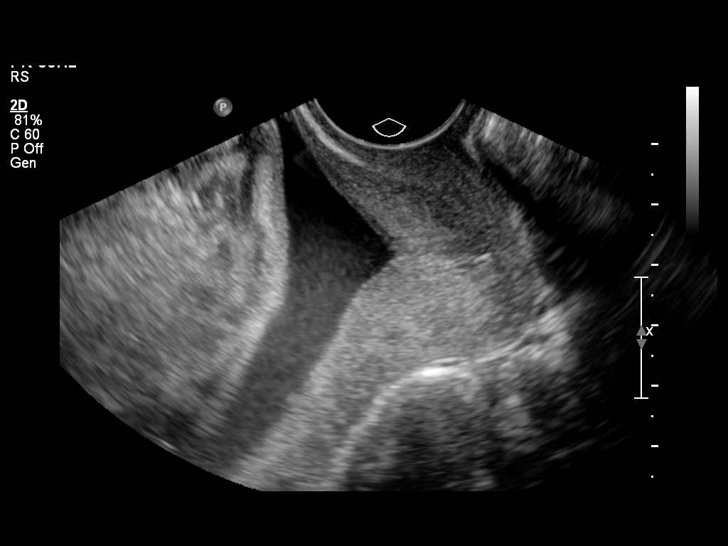
[im 6/14]
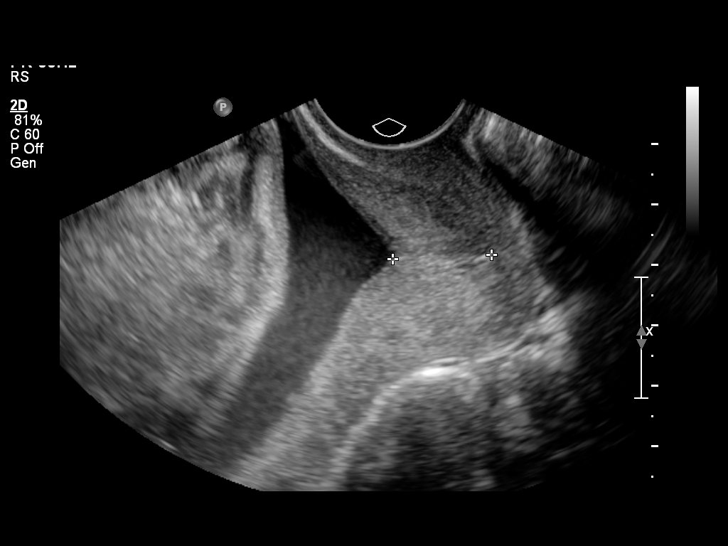
[im 7/14]
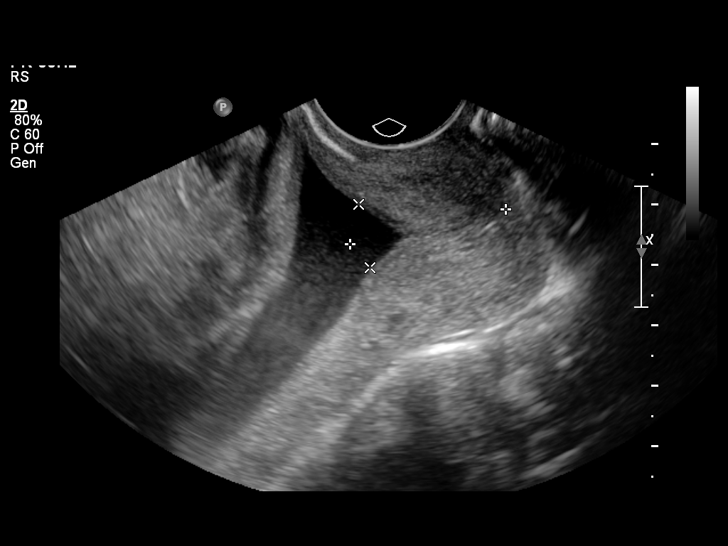
[im 8/14]
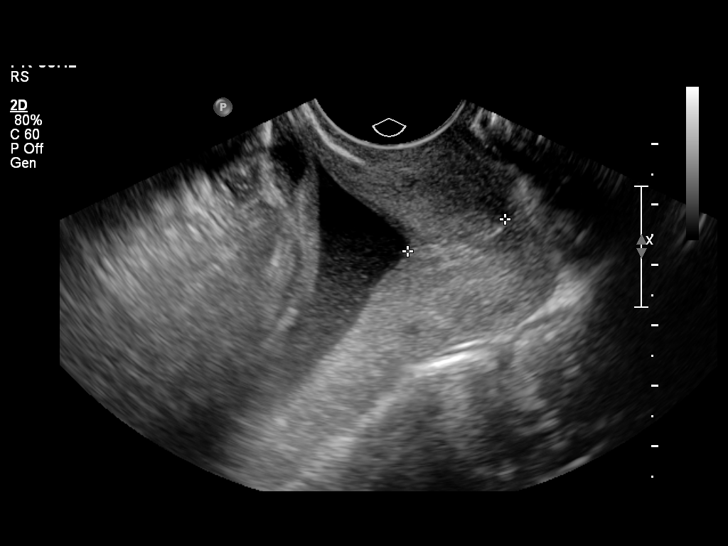
[im 9/14]
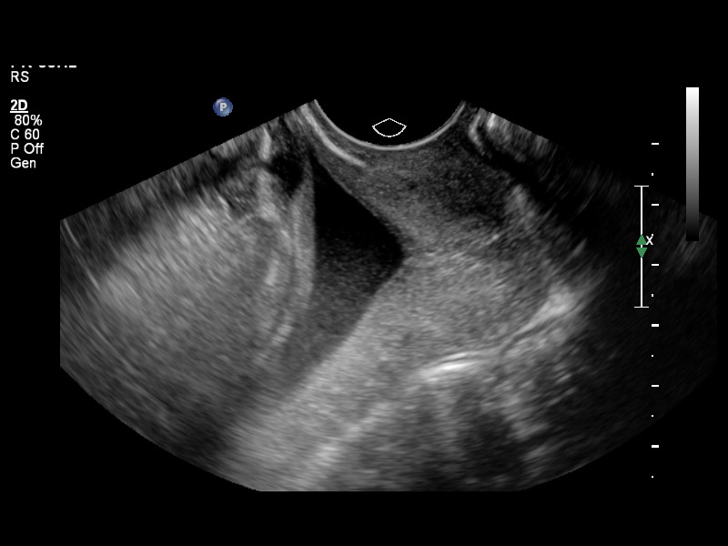
[im 10/14]
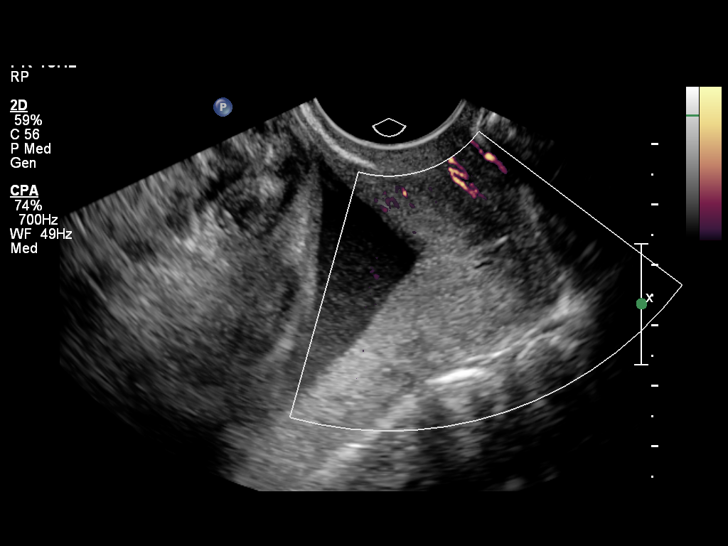
[im 11/14]
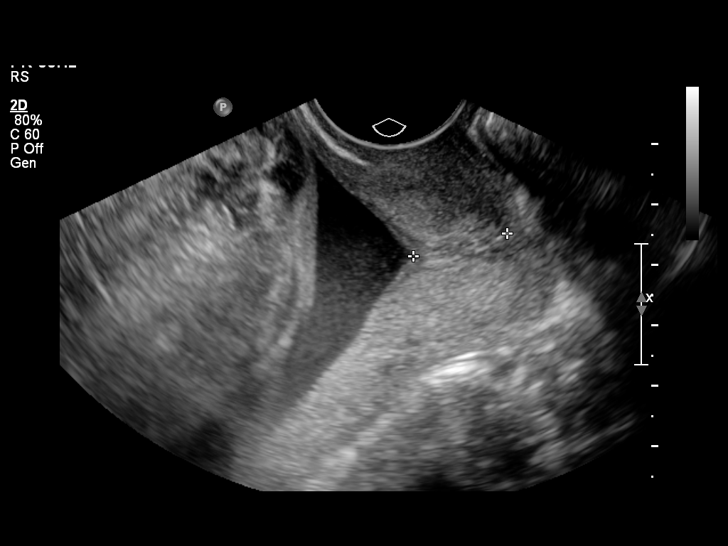
[im 12/14]
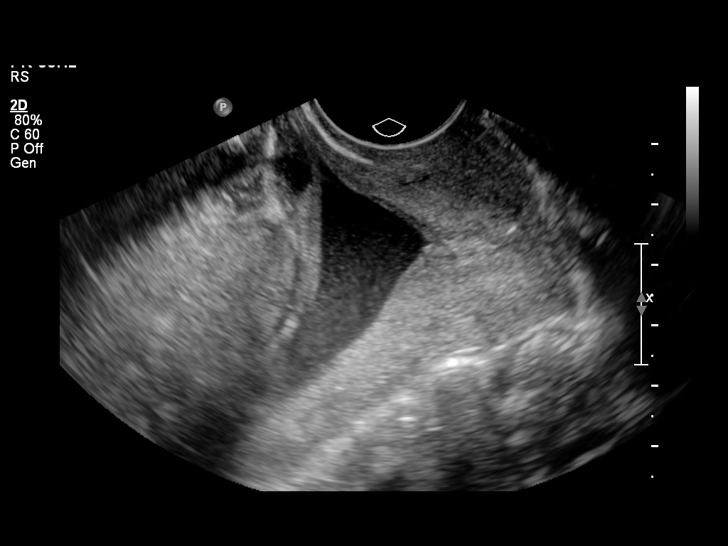
[im 13/14]
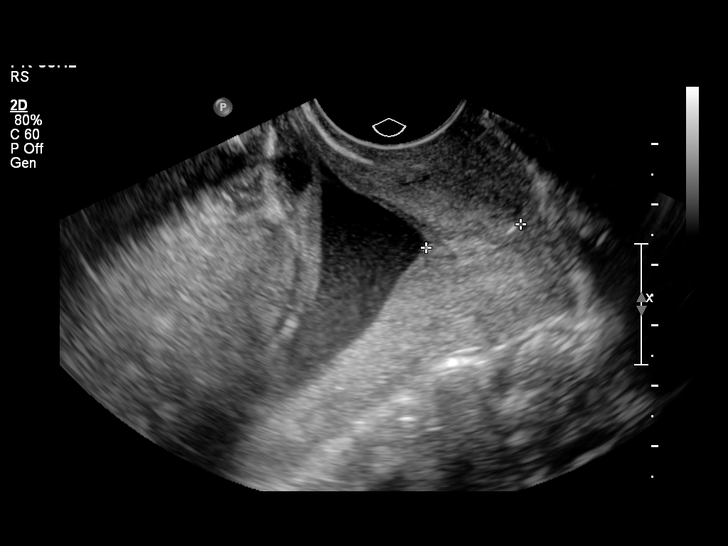
[im 14/14]
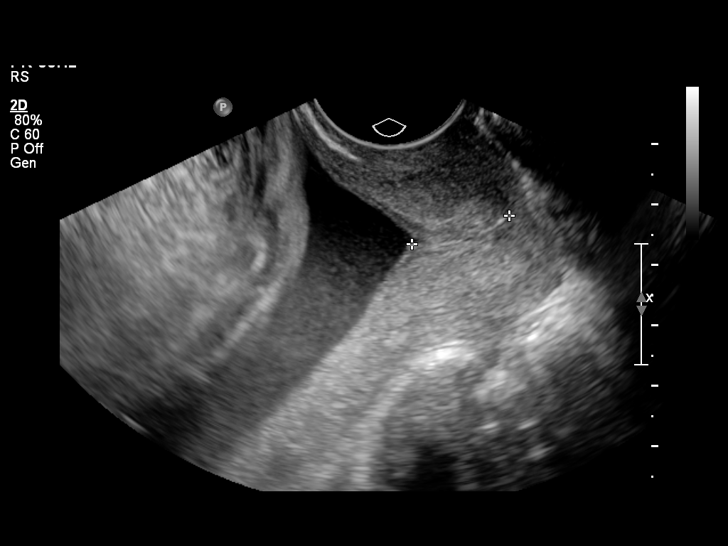

[14 of 14 positions shown; findings below may reference images not displayed]

IMPRESSION: Cervix measures 1.6 cm in length.

## 2013-05-21 IMAGING — US US OB TRANSVAGINAL
1 series · 14 of 27 positions shown · non-contrast
Comparison: none

[Series 1: us ob transvaginal · 0.23mm/px · 27 acquisitions, 14 frames shown]
[im 1/27]
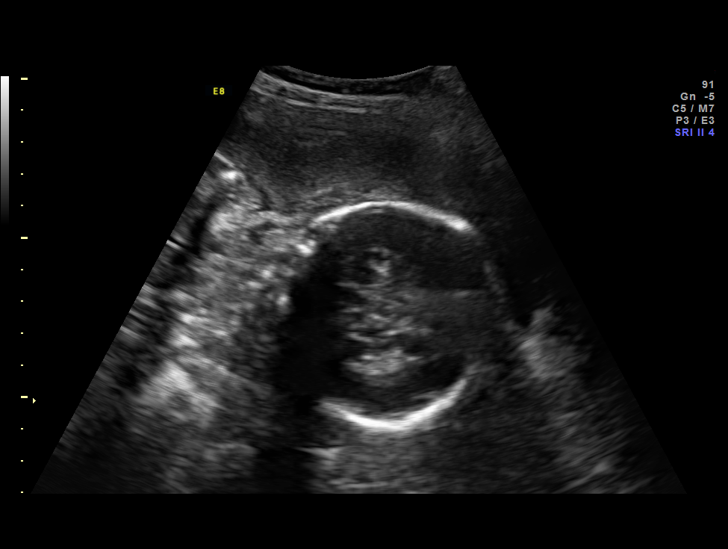
[im 3/27]
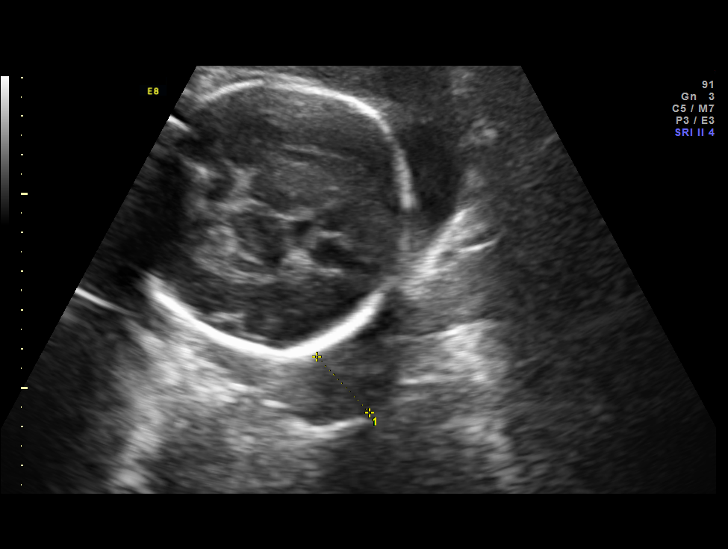
[im 5/27]
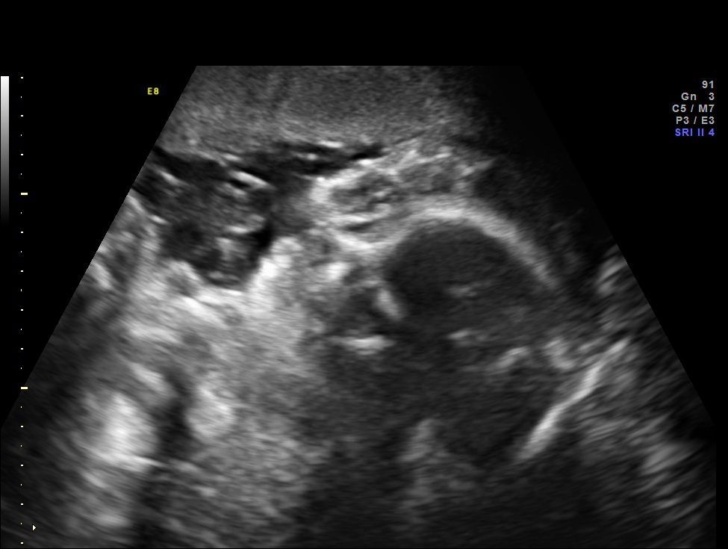
[im 7/27]
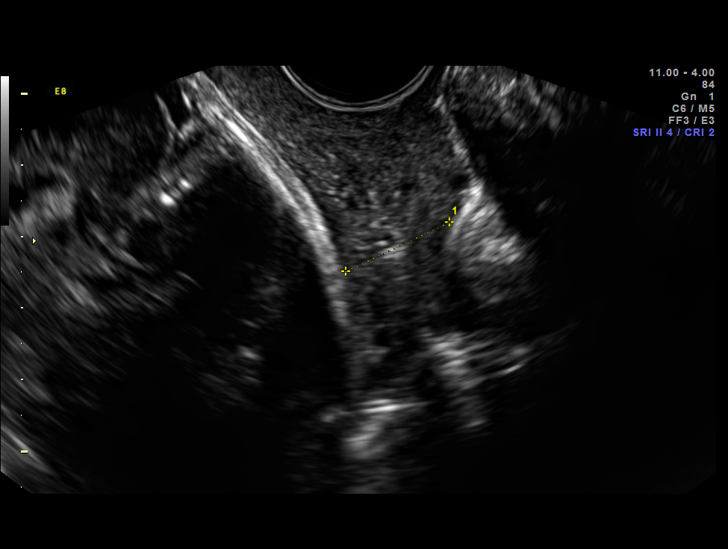
[im 9/27]
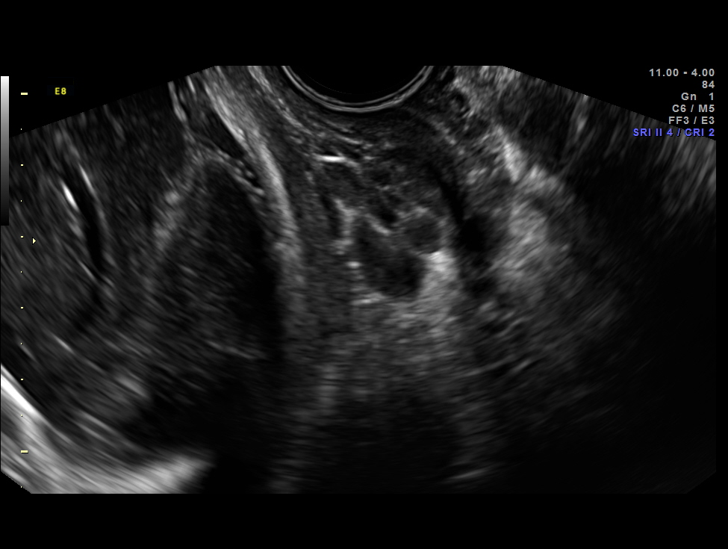
[im 11/27]
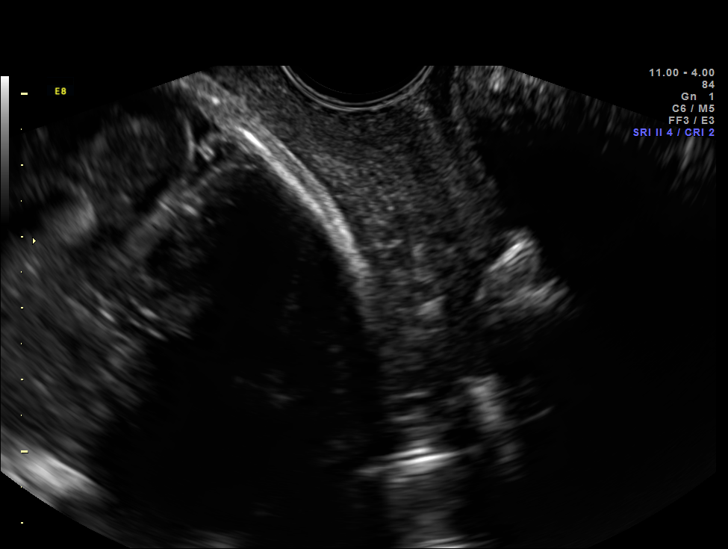
[im 13/27]
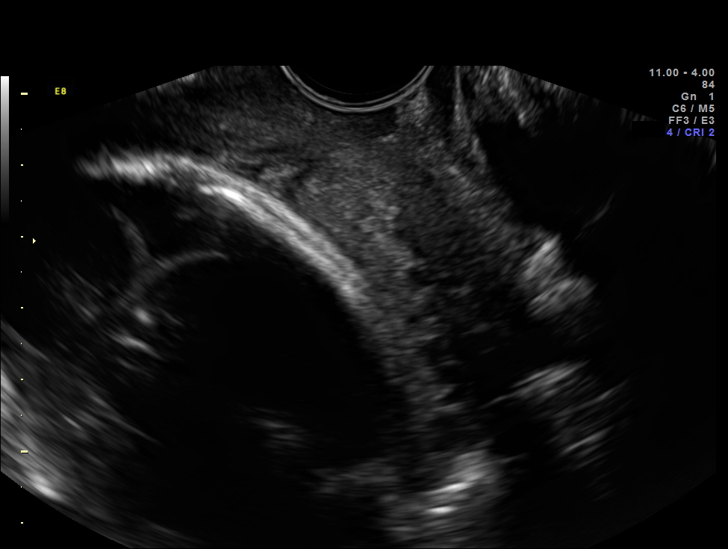
[im 15/27]
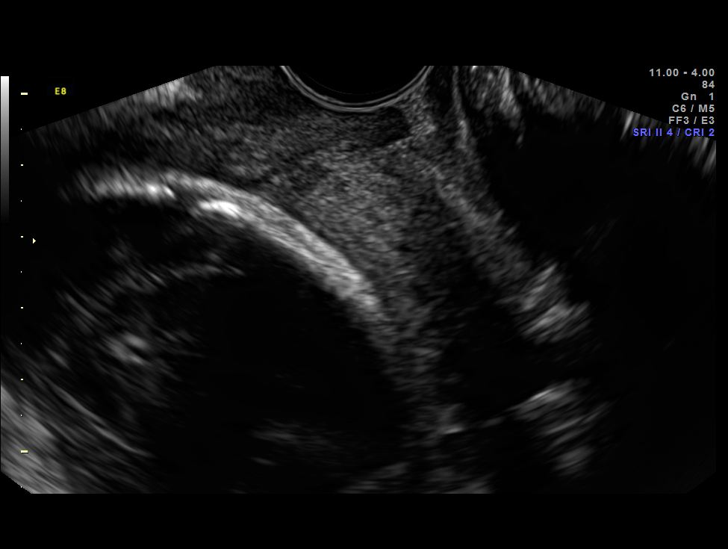
[im 17/27]
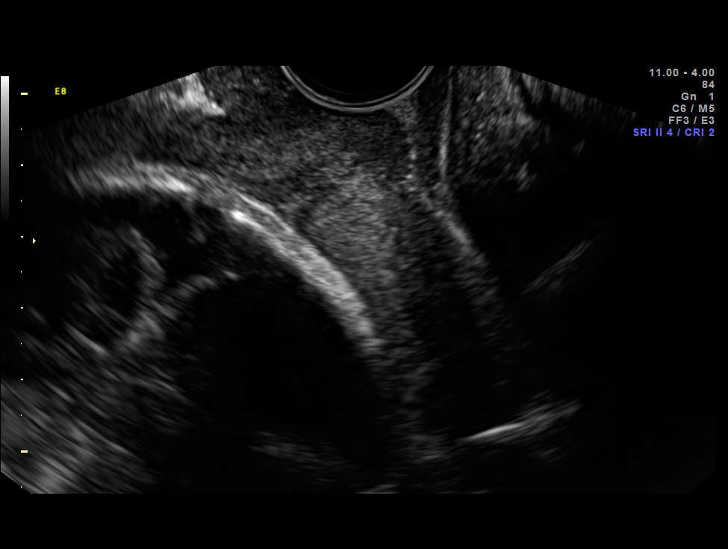
[im 19/27]
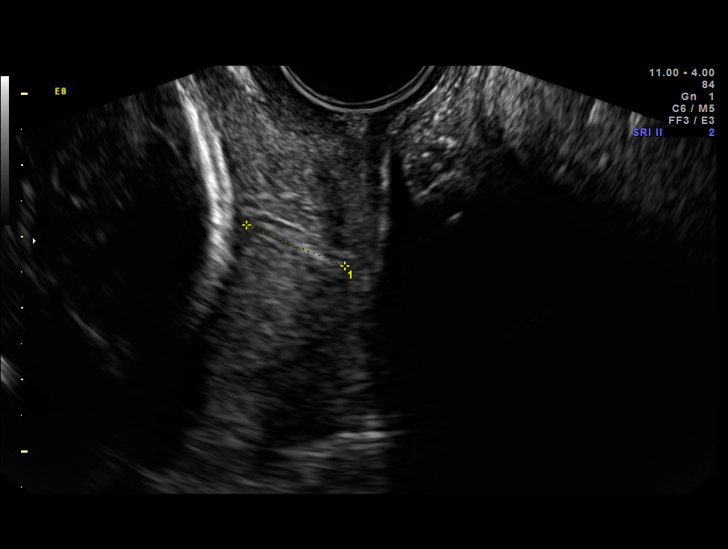
[im 21/27]
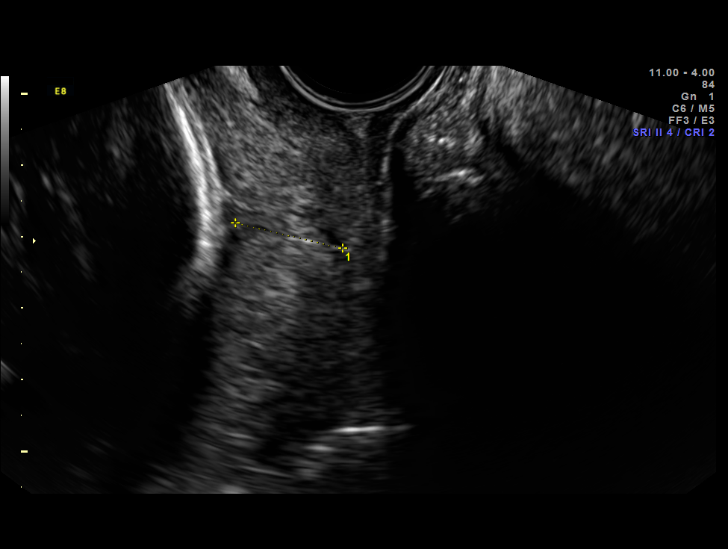
[im 23/27]
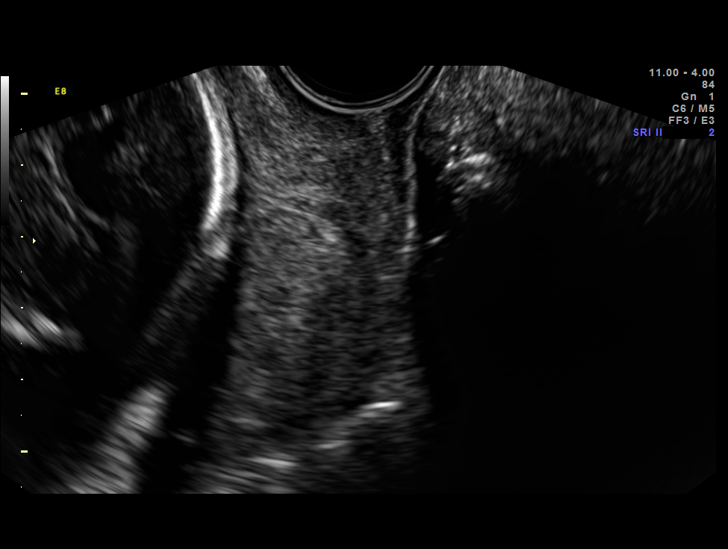
[im 25/27]
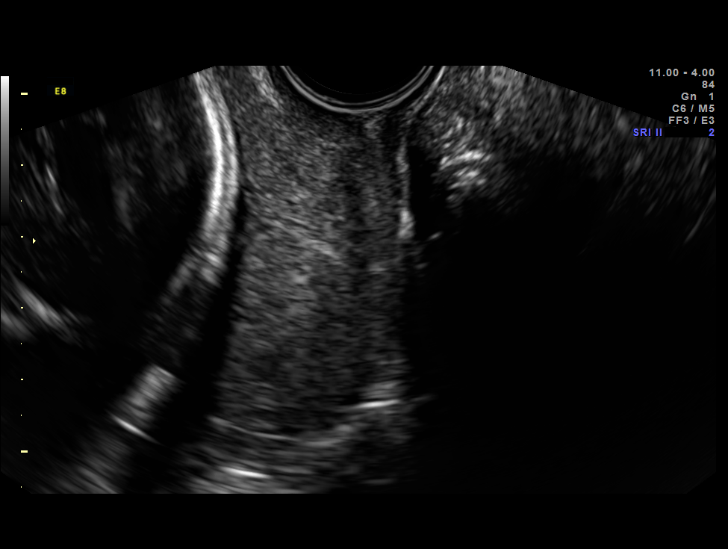
[im 27/27]
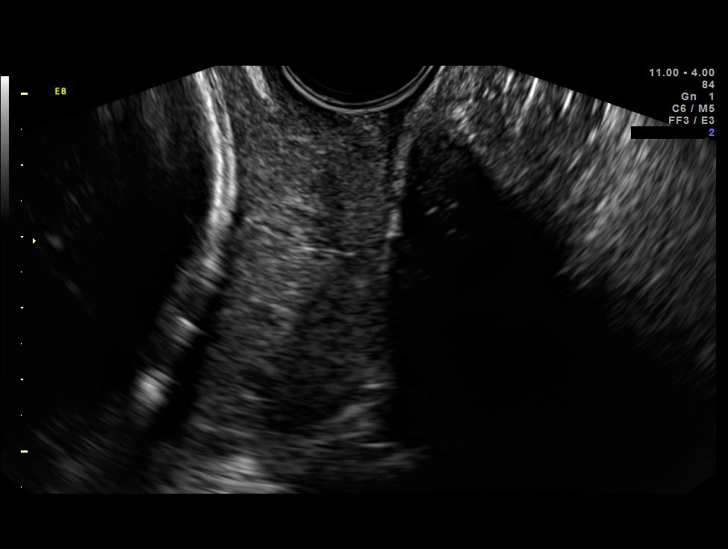

[14 of 27 positions shown; findings below may reference images not displayed]

OBSTETRICS REPORT
                      (Signed Final 06/11/2012 [DATE])

Service(s) Provided

 US OB TRANSVAGINAL                                    76817.0
Indications

 Cervical shortening
 Poor obstetric history: Previous preterm delivery x 2
 Previous cesarean section
 Previous cervical surgery (cone biopsy)
 Cerebral ventriculomegaly
Fetal Evaluation

 Num Of Fetuses:    1
 Fetal Heart Rate:  136                          bpm
 Cardiac Activity:  Observed
 Presentation:      Cephalic
Gestational Age

 LMP:           28w 6d        Date:  11/18/11                 EDD:   08/24/12
 Best:          28w 0d     Det. By:  U/S C R L (02/28/12)     EDD:   08/30/12
Cervix Uterus Adnexa

 Cervical Length:    1.4      cm

 Cervix:       Measured transvaginally.
Impression

 SIUP at 28+0 weeks
 EV views of cervix: shortened and measuring 1.4 cms; same
 to slightly shorter than last week (1.5 cms)
Recommendations

 Follow-up ultrasound for growth in 3 weeks

## 2013-06-05 ENCOUNTER — Emergency Department: Payer: Self-pay | Admitting: Emergency Medicine

## 2013-06-05 LAB — URINALYSIS, COMPLETE
Glucose,UR: NEGATIVE mg/dL (ref 0–75)
Nitrite: POSITIVE
Ph: 5 (ref 4.5–8.0)
Protein: 100
Specific Gravity: 1.033 (ref 1.003–1.030)

## 2013-06-05 LAB — COMPREHENSIVE METABOLIC PANEL
ALK PHOS: 74 U/L
ALT: 16 U/L (ref 12–78)
Albumin: 4.5 g/dL (ref 3.4–5.0)
Anion Gap: 5 — ABNORMAL LOW (ref 7–16)
BUN: 13 mg/dL (ref 7–18)
Bilirubin,Total: 0.7 mg/dL (ref 0.2–1.0)
CHLORIDE: 104 mmol/L (ref 98–107)
Calcium, Total: 8.5 mg/dL (ref 8.5–10.1)
Co2: 25 mmol/L (ref 21–32)
Creatinine: 0.73 mg/dL (ref 0.60–1.30)
GLUCOSE: 117 mg/dL — AB (ref 65–99)
Osmolality: 269 (ref 275–301)
POTASSIUM: 3.8 mmol/L (ref 3.5–5.1)
SGOT(AST): 18 U/L (ref 15–37)
SODIUM: 134 mmol/L — AB (ref 136–145)
Total Protein: 7.4 g/dL (ref 6.4–8.2)

## 2013-06-05 LAB — CBC
HCT: 33.8 % — AB (ref 35.0–47.0)
HGB: 11.1 g/dL — AB (ref 12.0–16.0)
MCH: 24.6 pg — ABNORMAL LOW (ref 26.0–34.0)
MCHC: 33 g/dL (ref 32.0–36.0)
MCV: 75 fL — AB (ref 80–100)
PLATELETS: 196 10*3/uL (ref 150–440)
RBC: 4.52 10*6/uL (ref 3.80–5.20)
RDW: 15.8 % — ABNORMAL HIGH (ref 11.5–14.5)
WBC: 8.3 10*3/uL (ref 3.6–11.0)

## 2013-06-06 ENCOUNTER — Emergency Department: Payer: Self-pay | Admitting: Emergency Medicine

## 2013-06-06 LAB — URINALYSIS, COMPLETE
BACTERIA: NONE SEEN
Bilirubin,UR: NEGATIVE
Glucose,UR: NEGATIVE mg/dL (ref 0–75)
Ketone: NEGATIVE
LEUKOCYTE ESTERASE: NEGATIVE
Nitrite: NEGATIVE
PH: 7 (ref 4.5–8.0)
Protein: NEGATIVE
RBC,UR: 19 /HPF (ref 0–5)
SPECIFIC GRAVITY: 1.015 (ref 1.003–1.030)
Squamous Epithelial: 1

## 2013-09-03 ENCOUNTER — Other Ambulatory Visit (HOSPITAL_COMMUNITY)
Admission: RE | Admit: 2013-09-03 | Discharge: 2013-09-03 | Disposition: A | Discharge status: Home / Self Care | Payer: Medicaid Other | Source: Ambulatory Visit | Attending: Obstetrics & Gynecology | Admitting: Obstetrics & Gynecology

## 2013-09-03 ENCOUNTER — Encounter: Payer: Self-pay | Admitting: Obstetrics & Gynecology

## 2013-09-03 ENCOUNTER — Ambulatory Visit (INDEPENDENT_AMBULATORY_CARE_PROVIDER_SITE_OTHER): Payer: Medicaid Other | Admitting: Obstetrics & Gynecology

## 2013-09-03 VITALS — BP 123/84 | HR 81 | Ht 63.0 in | Wt 119.2 lb

## 2013-09-03 DIAGNOSIS — Z01419 Encounter for gynecological examination (general) (routine) without abnormal findings: Secondary | ICD-10-CM

## 2013-09-03 DIAGNOSIS — Z124 Encounter for screening for malignant neoplasm of cervix: Secondary | ICD-10-CM | POA: Insufficient documentation

## 2013-09-03 DIAGNOSIS — Z1151 Encounter for screening for human papillomavirus (HPV): Secondary | ICD-10-CM | POA: Insufficient documentation

## 2013-09-03 DIAGNOSIS — G43909 Migraine, unspecified, not intractable, without status migrainosus: Secondary | ICD-10-CM

## 2013-09-03 DIAGNOSIS — N912 Amenorrhea, unspecified: Secondary | ICD-10-CM

## 2013-09-03 DIAGNOSIS — F410 Panic disorder [episodic paroxysmal anxiety] without agoraphobia: Secondary | ICD-10-CM

## 2013-09-03 DIAGNOSIS — Z1272 Encounter for screening for malignant neoplasm of vagina: Secondary | ICD-10-CM

## 2013-09-03 LAB — POCT URINE PREGNANCY: Preg Test, Ur: NEGATIVE

## 2013-09-03 MED ORDER — MEDROXYPROGESTERONE ACETATE 10 MG PO TABS: 10.0000 mg | ORAL_TABLET | Freq: Every day | ORAL | Status: DC

## 2013-09-03 NOTE — Progress Notes (Signed)
    GYNECOLOGY CLINIC ANNUAL PREVENTATIVE CARE ENCOUNTER NOTE  Subjective:     Natalie Duffy is a 33 y.o. [phone] female here for a routine annual gynecologic exam.  Current complaints: no period for 5 months. UPT here in clinic negative.  Had PPBTS, not on any hormonal medication or other medications that could alter her cycle. Reports increased stresses lately.  Has increased migraines and panic attacks when she expects to have her period.  Takes Imitrex for migraines. Wants referral to provider to help with panic attacks, worsened since taking Doxepin.   Gynecologic History No LMP recorded. Patient is not currently having periods (Reason: Needs Pregnancy Test). Contraception: tubal ligation Last Pap: 10/04/11. Results were: normal  Obstetric History OB History  Gravida Para Term Preterm AB SAB TAB Ectopic Multiple Living  5 4 2 2 1 1    4     # Outcome Date GA Lbr Len/2nd Weight Sex Delivery Anes PTL Lv  5 TRM 08/13/12 [redacted]w[redacted]d / 00:14 5 lb 8.4 oz (2.505 kg) F VBAC None  Y  4 TRM 12/25/08 [redacted]w[redacted]d  5 lb (2.268 kg) M VBAC EPI Y Y  3 PRE 10/04/06 [redacted]w[redacted]d  4 lb 13 oz (2.183 kg) M LTCS EPI Y Y     Comments: PPROM, failed to dilate  2 SAB 2007 [redacted]w[redacted]d            Comments: "the baby was growing outside of the sac and not living."  1 PRE 12/01/01 [redacted]w[redacted]d  4 lb 7 oz (2.013 kg) M SVD None Y Y     The following portions of the patient's history were reviewed and updated as appropriate: allergies, current medications, past family history, past medical history, past social history, past surgical history and problem list.  Review of Systems Pertinent items are noted in HPI.    Objective:   BP 123/84  Pulse 81  Ht 5\' 3"  (1.6 m)  Wt 119 lb 3.2 oz (54.069 kg)  BMI 21.12 kg/m2  Breastfeeding? No GENERAL: Well-developed, well-nourished female in no acute distress.  HEENT: Normocephalic, atraumatic. Sclerae anicteric.  NECK: Supple. Normal thyroid.  LUNGS: Clear to auscultation bilaterally.    HEART: Regular rate and rhythm. BREASTS: Symmetric in size. No masses, skin changes, nipple drainage, or lymphadenopathy. ABDOMEN: Soft, nontender, nondistended. No organomegaly. PELVIC: Normal external female genitalia. Vagina is pink and rugated.  Normal discharge. Normal cervix contour. Pap smear obtained. Uterus is normal in size. No adnexal mass or tenderness.  EXTREMITIES: No cyanosis, clubbing, or edema, 2+ distal pulses.   Assessment:   Annual gynecologic examination Amenorrhea Panic attacks Migraines   Plan:   Pap done, will follow up results and manage accordingly. Labs drawn to evaluate amenorrhea, will do Provera challenge. Referred to mental health provider for evaluation and management of panic attacks Continue Imitrex for migraines, will set up follow up appointment with Monna Fam, NP, Headache specialist. Routine preventative health maintenance measures emphasized   Verita Schneiders, MD, [place] Attending [place], [place]

## 2013-09-03 NOTE — Patient Instructions (Signed)
Preventive Care for Adults, Female A healthy lifestyle and preventive care can promote health and wellness. Preventive health guidelines for women include the following key practices.  A routine yearly physical is a good way to check with your health care provider about your health and preventive screening. It is a chance to share any concerns and updates on your health and to receive a thorough exam.  Visit your dentist for a routine exam and preventive care every 6 months. Brush your teeth twice a day and floss once a day. Good oral hygiene prevents tooth decay and gum disease.  The frequency of eye exams is based on your age, health, family medical history, use of contact lenses, and other factors. Follow your health care provider's recommendations for frequency of eye exams.  Eat a healthy diet. Foods like vegetables, fruits, whole grains, low-fat dairy products, and lean protein foods contain the nutrients you need without too many calories. Decrease your intake of foods high in solid fats, added sugars, and salt. Eat the right amount of calories for you.Get information about a proper diet from your health care provider, if necessary.  Regular physical exercise is one of the most important things you can do for your health. Most adults should get at least 150 minutes of moderate-intensity exercise (any activity that increases your heart rate and causes you to sweat) each week. In addition, most adults need muscle-strengthening exercises on 2 or more days a week.  Maintain a healthy weight. The body mass index (BMI) is a screening tool to identify possible weight problems. It provides an estimate of body fat based on height and weight. Your health care provider can find your BMI, and can help you achieve or maintain a healthy weight.For adults 20 years and older:  A BMI below 18.5 is considered underweight.  A BMI of 18.5 to 24.9 is normal.  A BMI of 25 to 29.9 is considered  overweight.  A BMI of 30 and above is considered obese.  Maintain normal blood lipids and cholesterol levels by exercising and minimizing your intake of saturated fat. Eat a balanced diet with plenty of fruit and vegetables. Blood tests for lipids and cholesterol should begin at age 20 and be repeated every 5 years. If your lipid or cholesterol levels are high, you are over 50, or you are at high risk for heart disease, you may need your cholesterol levels checked more frequently.Ongoing high lipid and cholesterol levels should be treated with medicines if diet and exercise are not working.  If you smoke, find out from your health care provider how to quit. If you do not use tobacco, do not start.  Lung cancer screening is recommended for adults aged 55 80 years who are at high risk for developing lung cancer because of a history of smoking. A yearly low-dose CT scan of the lungs is recommended for people who have at least a 30-pack-year history of smoking and are a current smoker or have quit within the past 15 years. A pack year of smoking is smoking an average of 1 pack of cigarettes a day for 1 year (for example: 1 pack a day for 30 years or 2 packs a day for 15 years). Yearly screening should continue until the smoker has stopped smoking for at least 15 years. Yearly screening should be stopped for people who develop a health problem that would prevent them from having lung cancer treatment.  If you are pregnant, do not drink alcohol. If you   are breastfeeding, be very cautious about drinking alcohol. If you are not pregnant and choose to drink alcohol, do not have more than 1 drink per day. One drink is considered to be 12 ounces (355 mL) of beer, 5 ounces (148 mL) of wine, or 1.5 ounces (44 mL) of liquor.  Avoid use of street drugs. Do not share needles with anyone. Ask for help if you need support or instructions about stopping the use of drugs.  High blood pressure causes heart disease and  increases the risk of stroke. Your blood pressure should be checked at least every 1 to 2 years. Ongoing high blood pressure should be treated with medicines if weight loss and exercise do not work.  If you are 20 32 years old, ask your health care provider if you should take aspirin to prevent strokes.  Diabetes screening involves taking a blood sample to check your fasting blood sugar level. This should be done once every 3 years, after age 35, if you are within normal weight and without risk factors for diabetes. Testing should be considered at a younger age or be carried out more frequently if you are overweight and have at least 1 risk factor for diabetes.  Breast cancer screening is essential preventive care for women. You should practice "breast self-awareness." This means understanding the normal appearance and feel of your breasts and may include breast self-examination. Any changes detected, no matter how small, should be reported to a health care provider. Women in their 42s and 30s should have a clinical breast exam (CBE) by a health care provider as part of a regular health exam every 1 to 3 years. After age 74, women should have a CBE every year. Starting at age 43, women should consider having a mammogram (breast X-ray test) every year. Women who have a family history of breast cancer should talk to their health care provider about genetic screening. Women at a high risk of breast cancer should talk to their health care providers about having an MRI and a mammogram every year.  Breast cancer gene (BRCA)-related cancer risk assessment is recommended for women who have family members with BRCA-related cancers. BRCA-related cancers include breast, ovarian, tubal, and peritoneal cancers. Having family members with these cancers may be associated with an increased risk for harmful changes (mutations) in the breast cancer genes BRCA1 and BRCA2. Results of the assessment will determine the need for  genetic counseling and BRCA1 and BRCA2 testing.  The Pap test is a screening test for cervical cancer. A Pap test can show cell changes on the cervix that might become cervical cancer if left untreated. A Pap test is a procedure in which cells are obtained and examined from the lower end of the uterus (cervix).  Women should have a Pap test starting at age 60.  Between ages 63 and 62, Pap tests should be repeated every 2 years.  Beginning at age 43, you should have a Pap test every 3 years as long as the past 3 Pap tests have been normal.  Some women have medical problems that increase the chance of getting cervical cancer. Talk to your health care provider about these problems. It is especially important to talk to your health care provider if a new problem develops soon after your last Pap test. In these cases, your health care provider may recommend more frequent screening and Pap tests.  The above recommendations are the same for women who have or have not gotten the vaccine  for human papillomavirus (HPV).  If you had a hysterectomy for a problem that was not cancer or a condition that could lead to cancer, then you no longer need Pap tests. Even if you no longer need a Pap test, a regular exam is a good idea to make sure no other problems are starting.  If you are between ages 65 and 70 years, and you have had normal Pap tests going back 10 years, you no longer need Pap tests. Even if you no longer need a Pap test, a regular exam is a good idea to make sure no other problems are starting.  If you have had past treatment for cervical cancer or a condition that could lead to cancer, you need Pap tests and screening for cancer for at least 20 years after your treatment.  If Pap tests have been discontinued, risk factors (such as a new sexual partner) need to be reassessed to determine if screening should be resumed.  The HPV test is an additional test that may be used for cervical cancer  screening. The HPV test looks for the virus that can cause the cell changes on the cervix. The cells collected during the Pap test can be tested for HPV. The HPV test could be used to screen women aged 30 years and older, and should be used in women of any age who have unclear Pap test results. After the age of 30, women should have HPV testing at the same frequency as a Pap test.  Colorectal cancer can be detected and often prevented. Most routine colorectal cancer screening begins at the age of 50 years and continues through age 75 years. However, your health care provider may recommend screening at an earlier age if you have risk factors for colon cancer. On a yearly basis, your health care provider may provide home test kits to check for hidden blood in the stool. Use of a small camera at the end of a tube, to directly examine the colon (sigmoidoscopy or colonoscopy), can detect the earliest forms of colorectal cancer. Talk to your health care provider about this at age 50, when routine screening begins. Direct exam of the colon should be repeated every 5 10 years through age 75 years, unless early forms of pre-cancerous polyps or small growths are found.  People who are at an increased risk for hepatitis B should be screened for this virus. You are considered at high risk for hepatitis B if:  You were born in a country where hepatitis B occurs often. Talk with your health care provider about which countries are considered high risk.  Your parents were born in a high-risk country and you have not received a shot to protect against hepatitis B (hepatitis B vaccine).  You have HIV or AIDS.  You use needles to inject street drugs.  You live with, or have sex with, someone who has Hepatitis B.  You get hemodialysis treatment.  You take certain medicines for conditions like cancer, organ transplantation, and autoimmune conditions.  Hepatitis C blood testing is recommended for all people born from  1945 through 1965 and any individual with known risks for hepatitis C.  Practice safe sex. Use condoms and avoid high-risk sexual practices to reduce the spread of sexually transmitted infections (STIs). STIs include gonorrhea, chlamydia, syphilis, trichomonas, herpes, HPV, and human immunodeficiency virus (HIV). Herpes, HIV, and HPV are viral illnesses that have no cure. They can result in disability, cancer, and death. Sexually active women aged 25   years and younger should be checked for chlamydia. Older women with new or multiple partners should also be tested for chlamydia. Testing for other STIs is recommended if you are sexually active and at increased risk.  Osteoporosis is a disease in which the bones lose minerals and strength with aging. This can result in serious bone fractures or breaks. The risk of osteoporosis can be identified using a bone density scan. Women ages 65 years and over and women at risk for fractures or osteoporosis should discuss screening with their health care providers. Ask your health care provider whether you should take a calcium supplement or vitamin D to reduce the rate of osteoporosis.  Menopause can be associated with physical symptoms and risks. Hormone replacement therapy is available to decrease symptoms and risks. You should talk to your health care provider about whether hormone replacement therapy is right for you.  Use sunscreen. Apply sunscreen liberally and repeatedly throughout the day. You should seek shade when your shadow is shorter than you. Protect yourself by wearing long sleeves, pants, a wide-brimmed hat, and sunglasses year round, whenever you are outdoors.  Once a month, do a whole body skin exam, using a mirror to look at the skin on your back. Tell your health care provider of new moles, moles that have irregular borders, moles that are larger than a pencil eraser, or moles that have changed in shape or color.  Stay current with required  vaccines (immunizations).  Influenza vaccine. All adults should be immunized every year.  Tetanus, diphtheria, and acellular pertussis (Td, Tdap) vaccine. Pregnant women should receive 1 dose of Tdap vaccine during each pregnancy. The dose should be obtained regardless of the length of time since the last dose. Immunization is preferred during the 27th 36th week of gestation. An adult who has not previously received Tdap or who does not know her vaccine status should receive 1 dose of Tdap. This initial dose should be followed by tetanus and diphtheria toxoids (Td) booster doses every 10 years. Adults with an unknown or incomplete history of completing a 3-dose immunization series with Td-containing vaccines should begin or complete a primary immunization series including a Tdap dose. Adults should receive a Td booster every 10 years.  Varicella vaccine. An adult without evidence of immunity to varicella should receive 2 doses or a second dose if she has previously received 1 dose. Pregnant females who do not have evidence of immunity should receive the first dose after pregnancy. This first dose should be obtained before leaving the health care facility. The second dose should be obtained 4 8 weeks after the first dose.  Human papillomavirus (HPV) vaccine. Females aged 13 26 years who have not received the vaccine previously should obtain the 3-dose series. The vaccine is not recommended for use in pregnant females. However, pregnancy testing is not needed before receiving a dose. If a female is found to be pregnant after receiving a dose, no treatment is needed. In that case, the remaining doses should be delayed until after the pregnancy. Immunization is recommended for any person with an immunocompromised condition through the age of 26 years if she did not get any or all doses earlier. During the 3-dose series, the second dose should be obtained 4 8 weeks after the first dose. The third dose should be  obtained 24 weeks after the first dose and 16 weeks after the second dose.  Zoster vaccine. One dose is recommended for adults aged 60 years or older unless certain   conditions are present.  Measles, mumps, and rubella (MMR) vaccine. Adults born before 1957 generally are considered immune to measles and mumps. Adults born in 1957 or later should have 1 or more doses of MMR vaccine unless there is a contraindication to the vaccine or there is laboratory evidence of immunity to each of the three diseases. A routine second dose of MMR vaccine should be obtained at least 28 days after the first dose for students attending postsecondary schools, health care workers, or international travelers. People who received inactivated measles vaccine or an unknown type of measles vaccine during 1963 1967 should receive 2 doses of MMR vaccine. People who received inactivated mumps vaccine or an unknown type of mumps vaccine before 1979 and are at high risk for mumps infection should consider immunization with 2 doses of MMR vaccine. For females of childbearing age, rubella immunity should be determined. If there is no evidence of immunity, females who are not pregnant should be vaccinated. If there is no evidence of immunity, females who are pregnant should delay immunization until after pregnancy. Unvaccinated health care workers born before 1957 who lack laboratory evidence of measles, mumps, or rubella immunity or laboratory confirmation of disease should consider measles and mumps immunization with 2 doses of MMR vaccine or rubella immunization with 1 dose of MMR vaccine.  Pneumococcal 13-valent conjugate (PCV13) vaccine. When indicated, a person who is uncertain of her immunization history and has no record of immunization should receive the PCV13 vaccine. An adult aged 19 years or older who has certain medical conditions and has not been previously immunized should receive 1 dose of PCV13 vaccine. This PCV13 should be  followed with a dose of pneumococcal polysaccharide (PPSV23) vaccine. The PPSV23 vaccine dose should be obtained at least 8 weeks after the dose of PCV13 vaccine. An adult aged 19 years or older who has certain medical conditions and previously received 1 or more doses of PPSV23 vaccine should receive 1 dose of PCV13. The PCV13 vaccine dose should be obtained 1 or more years after the last PPSV23 vaccine dose.  Pneumococcal polysaccharide (PPSV23) vaccine. When PCV13 is also indicated, PCV13 should be obtained first. All adults aged 65 years and older should be immunized. An adult younger than age 65 years who has certain medical conditions should be immunized. Any person who resides in a nursing home or long-term care facility should be immunized. An adult smoker should be immunized. People with an immunocompromised condition and certain other conditions should receive both PCV13 and PPSV23 vaccines. People with human immunodeficiency virus (HIV) infection should be immunized as soon as possible after diagnosis. Immunization during chemotherapy or radiation therapy should be avoided. Routine use of PPSV23 vaccine is not recommended for American Indians, [place] Natives, or people younger than 65 years unless there are medical conditions that require PPSV23 vaccine. When indicated, people who have unknown immunization and have no record of immunization should receive PPSV23 vaccine. One-time revaccination 5 years after the first dose of PPSV23 is recommended for people aged 19 64 years who have chronic kidney failure, nephrotic syndrome, asplenia, or immunocompromised conditions. People who received 1 2 doses of PPSV23 before age 65 years should receive another dose of PPSV23 vaccine at age 65 years or later if at least 5 years have passed since the previous dose. Doses of PPSV23 are not needed for people immunized with PPSV23 at or after age 65 years.  Meningococcal vaccine. Adults with asplenia or persistent  complement component deficiencies should receive 2   doses of quadrivalent meningococcal conjugate (MenACWY-D) vaccine. The doses should be obtained at least 2 months apart. Microbiologists working with certain meningococcal bacteria, military recruits, people at risk during an outbreak, and people who travel to or live in countries with a high rate of meningitis should be immunized. A first-year college student up through age 21 years who is living in a residence hall should receive a dose if she did not receive a dose on or after her 16th birthday. Adults who have certain high-risk conditions should receive one or more doses of vaccine.  Hepatitis A vaccine. Adults who wish to be protected from this disease, have certain high-risk conditions, work with hepatitis A-infected animals, work in hepatitis A research labs, or travel to or work in countries with a high rate of hepatitis A should be immunized. Adults who were previously unvaccinated and who anticipate close contact with an international adoptee during the first 60 days after arrival in the [place] from a country with a high rate of hepatitis A should be immunized.  Hepatitis B vaccine. Adults who wish to be protected from this disease, have certain high-risk conditions, may be exposed to blood or other infectious body fluids, are household contacts or sex partners of hepatitis B positive people, are clients or workers in certain care facilities, or travel to or work in countries with a high rate of hepatitis B should be immunized.  Haemophilus influenzae type b (Hib) vaccine. A previously unvaccinated person with asplenia or sickle cell disease or having a scheduled splenectomy should receive 1 dose of Hib vaccine. Regardless of previous immunization, a recipient of a hematopoietic stem cell transplant should receive a 3-dose series 6 12 months after her successful transplant. Hib vaccine is not recommended for adults with HIV  infection. Preventive Services / Frequency Ages 19 to 39years  Blood pressure check.** / Every 1 to 2 years.  Lipid and cholesterol check.** / Every 5 years beginning at age 20.  Clinical breast exam.** / Every 3 years for women in their 20s and 30s.  BRCA-related cancer risk assessment.** / For women who have family members with a BRCA-related cancer (breast, ovarian, tubal, or peritoneal cancers).  Pap test.** / Every 2 years from ages 21 through 29. Every 3 years starting at age 30 through age 65 or 70 with a history of 3 consecutive normal Pap tests.  HPV screening.** / Every 3 years from ages 30 through ages 65 to 70 with a history of 3 consecutive normal Pap tests.  Hepatitis C blood test.** / For any individual with known risks for hepatitis C.  Skin self-exam. / Monthly.  Influenza vaccine. / Every year.  Tetanus, diphtheria, and acellular pertussis (Tdap, Td) vaccine.** / Consult your health care provider. Pregnant women should receive 1 dose of Tdap vaccine during each pregnancy. 1 dose of Td every 10 years.  Varicella vaccine.** / Consult your health care provider. Pregnant females who do not have evidence of immunity should receive the first dose after pregnancy.  HPV vaccine. / 3 doses over 6 months, if 26 and younger. The vaccine is not recommended for use in pregnant females. However, pregnancy testing is not needed before receiving a dose.  Measles, mumps, rubella (MMR) vaccine.** / You need at least 1 dose of MMR if you were born in 1957 or later. You may also need a 2nd dose. For females of childbearing age, rubella immunity should be determined. If there is no evidence of immunity, females who are not   pregnant should be vaccinated. If there is no evidence of immunity, females who are pregnant should delay immunization until after pregnancy.  Pneumococcal 13-valent conjugate (PCV13) vaccine.** / Consult your health care provider.  Pneumococcal polysaccharide (PPSV23)  vaccine.** / 1 to 2 doses if you smoke cigarettes or if you have certain conditions.  Meningococcal vaccine.** / 1 dose if you are age 88 to 41 years and a Market researcher living in a residence hall, or have one of several medical conditions, you need to get vaccinated against meningococcal disease. You may also need additional booster doses.  Hepatitis A vaccine.** / Consult your health care provider.  Hepatitis B vaccine.** / Consult your health care provider.  Haemophilus influenzae type b (Hib) vaccine.** / Consult your health care provider. Ages 45 to 64years  Blood pressure check.** / Every 1 to 2 years.  Lipid and cholesterol check.** / Every 5 years beginning at age 2 years.  Lung cancer screening. / Every year if you are aged 10 80 years and have a 30-pack-year history of smoking and currently smoke or have quit within the past 15 years. Yearly screening is stopped once you have quit smoking for at least 15 years or develop a health problem that would prevent you from having lung cancer treatment.  Clinical breast exam.** / Every year after age 28 years.  BRCA-related cancer risk assessment.** / For women who have family members with a BRCA-related cancer (breast, ovarian, tubal, or peritoneal cancers).  Mammogram.** / Every year beginning at age 63 years and continuing for as long as you are in good health. Consult with your health care provider.  Pap test.** / Every 3 years starting at age 71 years through age 39 or 90 years with a history of 3 consecutive normal Pap tests.  HPV screening.** / Every 3 years from ages 27 years through ages 32 to 72 years with a history of 3 consecutive normal Pap tests.  Fecal occult blood test (FOBT) of stool. / Every year beginning at age 40 years and continuing until age 29 years. You may not need to do this test if you get a colonoscopy every 10 years.  Flexible sigmoidoscopy or colonoscopy.** / Every 5 years for a flexible  sigmoidoscopy or every 10 years for a colonoscopy beginning at age 66 years and continuing until age 47 years.  Hepatitis C blood test.** / For all people born from [birth year] through 1964 and any individual with known risks for hepatitis C.  Skin self-exam. / Monthly.  Influenza vaccine. / Every year.  Tetanus, diphtheria, and acellular pertussis (Tdap/Td) vaccine.** / Consult your health care provider. Pregnant women should receive 1 dose of Tdap vaccine during each pregnancy. 1 dose of Td every 10 years.  Varicella vaccine.** / Consult your health care provider. Pregnant females who do not have evidence of immunity should receive the first dose after pregnancy.  Zoster vaccine.** / 1 dose for adults aged 39 years or older.  Measles, mumps, rubella (MMR) vaccine.** / You need at least 1 dose of MMR if you were born in 1957 or later. You may also need a 2nd dose. For females of childbearing age, rubella immunity should be determined. If there is no evidence of immunity, females who are not pregnant should be vaccinated. If there is no evidence of immunity, females who are pregnant should delay immunization until after pregnancy.  Pneumococcal 13-valent conjugate (PCV13) vaccine.** / Consult your health care provider.  Pneumococcal polysaccharide (PPSV23) vaccine.** / 1  to 2 doses if you smoke cigarettes or if you have certain conditions.  Meningococcal vaccine.** / Consult your health care provider.  Hepatitis A vaccine.** / Consult your health care provider.  Hepatitis B vaccine.** / Consult your health care provider.  Haemophilus influenzae type b (Hib) vaccine.** / Consult your health care provider. Ages 66 years and over  Blood pressure check.** / Every 1 to 2 years.  Lipid and cholesterol check.** / Every 5 years beginning at age 50 years.  Lung cancer screening. / Every year if you are aged 65 80 years and have a 30-pack-year history of smoking and currently smoke or have quit  within the past 15 years. Yearly screening is stopped once you have quit smoking for at least 15 years or develop a health problem that would prevent you from having lung cancer treatment.  Clinical breast exam.** / Every year after age 71 years.  BRCA-related cancer risk assessment.** / For women who have family members with a BRCA-related cancer (breast, ovarian, tubal, or peritoneal cancers).  Mammogram.** / Every year beginning at age 58 years and continuing for as long as you are in good health. Consult with your health care provider.  Pap test.** / Every 3 years starting at age 51 years through age 34 or 38 years with 3 consecutive normal Pap tests. Testing can be stopped between 65 and 70 years with 3 consecutive normal Pap tests and no abnormal Pap or HPV tests in the past 10 years.  HPV screening.** / Every 3 years from ages 35 years through ages 84 or 80 years with a history of 3 consecutive normal Pap tests. Testing can be stopped between 65 and 70 years with 3 consecutive normal Pap tests and no abnormal Pap or HPV tests in the past 10 years.  Fecal occult blood test (FOBT) of stool. / Every year beginning at age 26 years and continuing until age 51 years. You may not need to do this test if you get a colonoscopy every 10 years.  Flexible sigmoidoscopy or colonoscopy.** / Every 5 years for a flexible sigmoidoscopy or every 10 years for a colonoscopy beginning at age 55 years and continuing until age 19 years.  Hepatitis C blood test.** / For all people born from [birth year] through 1964 and any individual with known risks for hepatitis C.  Osteoporosis screening.** / A one-time screening for women ages 68 years and over and women at risk for fractures or osteoporosis.  Skin self-exam. / Monthly.  Influenza vaccine. / Every year.  Tetanus, diphtheria, and acellular pertussis (Tdap/Td) vaccine.** / 1 dose of Td every 10 years.  Varicella vaccine.** / Consult your health care  provider.  Zoster vaccine.** / 1 dose for adults aged 21 years or older.  Pneumococcal 13-valent conjugate (PCV13) vaccine.** / Consult your health care provider.  Pneumococcal polysaccharide (PPSV23) vaccine.** / 1 dose for all adults aged 43 years and older.  Meningococcal vaccine.** / Consult your health care provider.  Hepatitis A vaccine.** / Consult your health care provider.  Hepatitis B vaccine.** / Consult your health care provider.  Haemophilus influenzae type b (Hib) vaccine.** / Consult your health care provider. ** Family history and personal history of risk and conditions may change your health care provider's recommendations. Document Released: 07/04/2001 Document Revised: 02/26/2013 Document Reviewed: 10/03/2010 [facility] Patient Information 2014 [place], [place].  Thank you for enrolling in [facility]. Please follow the instructions below to securely access your online medical record. MyChart allows you to send messages  to your doctor, view your test results, manage appointments, and more.   How Do I Sign Up? 1. In your Internet browser, go to [employer] and enter [email or web address]. 2. Click on the Sign Up Now link in the Sign In box. You will see the New Member Sign Up page. 3. Enter your MyChart Access Code exactly as it appears below. You will not need to use this code after you've completed the sign-up process. If you do not sign up before the expiration date, you must request a new code.  MyChart Access Code: [email or web address] Expires: 11/02/2013 10:54 AM  4. Enter your Social Security Number ([id number]) and Date of Birth (mm/dd/yyyy) as indicated and click Submit. You will be taken to the next sign-up page. 5. Create a MyChart ID. This will be your MyChart login ID and cannot be changed, so think of one that is secure and easy to remember. 6. Create a MyChart password. You can change your password at any time. 7. Enter your Password Reset Question  and Answer. This can be used at a later time if you forget your password.  8. Enter your e-mail address. You will receive e-mail notification when new information is available in [facility]. 9. Click Sign Up. You can now view your medical record.   Additional Information Remember, MyChart is NOT to be used for urgent needs. For medical emergencies, dial 911.

## 2013-09-04 ENCOUNTER — Encounter: Payer: Self-pay | Admitting: Obstetrics & Gynecology

## 2013-09-04 LAB — TSH: TSH: 2.002 u[IU]/mL (ref 0.350–4.500)

## 2013-09-04 LAB — TESTOSTERONE, FREE, TOTAL, SHBG
Sex Hormone Binding: 53 nmol/L (ref 18–114)
Testosterone, Free: 3 pg/mL (ref 0.6–6.8)
Testosterone-% Free: 1.3 % (ref 0.4–2.4)
Testosterone: 23 ng/dL (ref 10–70)

## 2013-09-04 LAB — FOLLICLE STIMULATING HORMONE: FSH: 12 m[IU]/mL

## 2013-09-04 LAB — HCG, QUANTITATIVE, PREGNANCY

## 2013-09-16 ENCOUNTER — Ambulatory Visit (INDEPENDENT_AMBULATORY_CARE_PROVIDER_SITE_OTHER): Payer: Medicaid Other | Admitting: Nurse Practitioner

## 2013-09-16 ENCOUNTER — Encounter: Payer: Self-pay | Admitting: Nurse Practitioner

## 2013-09-16 VITALS — BP 145/74 | HR 85 | Ht 63.0 in | Wt 117.2 lb

## 2013-09-16 DIAGNOSIS — G43019 Migraine without aura, intractable, without status migrainosus: Secondary | ICD-10-CM

## 2013-09-16 DIAGNOSIS — F419 Anxiety disorder, unspecified: Secondary | ICD-10-CM | POA: Insufficient documentation

## 2013-09-16 DIAGNOSIS — F411 Generalized anxiety disorder: Secondary | ICD-10-CM

## 2013-09-16 MED ORDER — RIZATRIPTAN BENZOATE 10 MG PO TABS: 10.0000 mg | ORAL_TABLET | ORAL | Status: DC | PRN

## 2013-09-16 MED ORDER — DOXEPIN HCL 100 MG PO CAPS: 100.0000 mg | ORAL_CAPSULE | Freq: Every day | ORAL | Status: DC

## 2013-09-16 NOTE — Progress Notes (Signed)
History:  Natalie Duffy is a 33 y.o. [phone] who presents to [facility] clinic today for follow up on migraines. Her last visit was one year ago. She stopped taking her Doxepin and Norvasc in January because she was doing well. Since then she has had trouble with landlord and they are in process of building a home which is stressful. She is back to having a daily headache of some sort. She has 3 severe , 15 moderates and rest milds. She would like to go back on Doxepin daily. She is also having side effects of Imitrex and would like to change to something else.  The following portions of the patient's history were reviewed and updated as appropriate: allergies, current medications, past family history, past medical history, past social history, past surgical history and problem list.  Review of Systems:  Pertinent items are noted in HPI.  Objective:  Physical Exam BP 145/74  Pulse 85  Ht 5\' 3"  (1.6 m)  Wt 117 lb 3.2 oz (53.162 kg)  BMI 20.77 kg/m2 GENERAL: Well-developed, well-nourished female in no acute distress.  HEENT: Normocephalic, atraumatic.  NECK: Supple. Normal thyroid.  LUNGS: Normal rate. Clear to auscultation bilaterally.  HEART: Regular rate and rhythm with no adventitious sounds.  EXTREMITIES: No cyanosis, clubbing, or edema, 2+ distal pulses.   Labs and Imaging No results found.  Assessment & Plan:  Assessment:  Migraine Anxiety  Plans: Restart Doxepin 100 mg nightly/ take 1/2 dose for few days then up to whole tab Change Imitrex to Maxalt 10 mg daily Follow up prn  Olegario Messier, NP 09/16/2013 10:22 AM

## 2013-09-16 NOTE — Patient Instructions (Signed)
Migraine Headache A migraine headache is an intense, throbbing pain on one or both sides of your head. A migraine can last for 30 minutes to several hours. CAUSES  The exact cause of a migraine headache is not always known. However, a migraine may be caused when nerves in the brain become irritated and release chemicals that cause inflammation. This causes pain. Certain things may also trigger migraines, such as:  Alcohol.  Smoking.  Stress.  Menstruation.  Aged cheeses.  Foods or drinks that contain nitrates, glutamate, aspartame, or tyramine.  Lack of sleep.  Chocolate.  Caffeine.  Hunger.  Physical exertion.  Fatigue.  Medicines used to treat chest pain (nitroglycerine), birth control pills, estrogen, and some blood pressure medicines. SIGNS AND SYMPTOMS  Pain on one or both sides of your head.  Pulsating or throbbing pain.  Severe pain that prevents daily activities.  Pain that is aggravated by any physical activity.  Nausea, vomiting, or both.  Dizziness.  Pain with exposure to bright lights, loud noises, or activity.  General sensitivity to bright lights, loud noises, or smells. Before you get a migraine, you may get warning signs that a migraine is coming (aura). An aura may include:  Seeing flashing lights.  Seeing bright spots, halos, or zig-zag lines.  Having tunnel vision or blurred vision.  Having feelings of numbness or tingling.  Having trouble talking.  Having muscle weakness. DIAGNOSIS  A migraine headache is often diagnosed based on:  Symptoms.  Physical exam.  A CT scan or MRI of your head. These imaging tests cannot diagnose migraines, but they can help rule out other causes of headaches. TREATMENT Medicines may be given for pain and nausea. Medicines can also be given to help prevent recurrent migraines.  HOME CARE INSTRUCTIONS  Only take over-the-counter or prescription medicines for pain or discomfort as directed by your  health care provider. The use of long-term narcotics is not recommended.  Lie down in a dark, quiet room when you have a migraine.  Keep a journal to find out what may trigger your migraine headaches. For example, write down:  What you eat and drink.  How much sleep you get.  Any change to your diet or medicines.  Limit alcohol consumption.  Quit smoking if you smoke.  Get 7 9 hours of sleep, or as recommended by your health care provider.  Limit stress.  Keep lights dim if bright lights bother you and make your migraines worse. SEEK IMMEDIATE MEDICAL CARE IF:   Your migraine becomes severe.  You have a fever.  You have a stiff neck.  You have vision loss.  You have muscular weakness or loss of muscle control.  You start losing your balance or have trouble walking.  You feel faint or pass out.  You have severe symptoms that are different from your first symptoms. MAKE SURE YOU:   Understand these instructions.  Will watch your condition.  Will get help right away if you are not doing well or get worse. Document Released: 05/08/2005 Document Revised: 02/26/2013 Document Reviewed: 01/13/2013 [employer] Patient Information 2014 [employer].  

## 2013-10-06 ENCOUNTER — Telehealth: Payer: Self-pay

## 2013-10-06 NOTE — Telephone Encounter (Signed)
Encounter opened in error

## 2013-12-25 ENCOUNTER — Other Ambulatory Visit (INDEPENDENT_AMBULATORY_CARE_PROVIDER_SITE_OTHER): Payer: Medicaid Other | Admitting: *Deleted

## 2013-12-25 ENCOUNTER — Emergency Department: Payer: Self-pay | Admitting: Emergency Medicine

## 2013-12-25 DIAGNOSIS — R309 Painful micturition, unspecified: Secondary | ICD-10-CM

## 2013-12-25 DIAGNOSIS — R300 Dysuria: Secondary | ICD-10-CM

## 2013-12-25 LAB — URINALYSIS, COMPLETE
BILIRUBIN, UR: NEGATIVE
Blood: NEGATIVE
GLUCOSE, UR: NEGATIVE mg/dL (ref 0–75)
Ketone: NEGATIVE
Leukocyte Esterase: NEGATIVE
NITRITE: NEGATIVE
PH: 6 (ref 4.5–8.0)
Protein: NEGATIVE
RBC,UR: 2 /HPF (ref 0–5)
Specific Gravity: 1.018 (ref 1.003–1.030)
WBC UR: 3 /HPF (ref 0–5)

## 2013-12-25 LAB — COMPREHENSIVE METABOLIC PANEL
ALBUMIN: 4.1 g/dL (ref 3.4–5.0)
ALK PHOS: 58 U/L
AST: 13 U/L — AB (ref 15–37)
Anion Gap: 9 (ref 7–16)
BUN: 15 mg/dL (ref 7–18)
Bilirubin,Total: 0.5 mg/dL (ref 0.2–1.0)
CHLORIDE: 103 mmol/L (ref 98–107)
CREATININE: 0.75 mg/dL (ref 0.60–1.30)
Calcium, Total: 8.5 mg/dL (ref 8.5–10.1)
Co2: 26 mmol/L (ref 21–32)
GLUCOSE: 97 mg/dL (ref 65–99)
Osmolality: 276 (ref 275–301)
POTASSIUM: 3.6 mmol/L (ref 3.5–5.1)
SGPT (ALT): 15 U/L
SODIUM: 138 mmol/L (ref 136–145)
Total Protein: 7.5 g/dL (ref 6.4–8.2)

## 2013-12-25 LAB — POCT URINALYSIS DIPSTICK
Bilirubin, UA: NEGATIVE
GLUCOSE UA: NEGATIVE
Ketones, UA: NEGATIVE
LEUKOCYTES UA: NEGATIVE
NITRITE UA: NEGATIVE
PROTEIN UA: NEGATIVE
SPEC GRAV UA: 1.01
UROBILINOGEN UA: NEGATIVE
pH, UA: 6

## 2013-12-25 LAB — CBC WITH DIFFERENTIAL/PLATELET
Basophil #: 0 10*3/uL (ref 0.0–0.1)
Basophil %: 0.3 %
Eosinophil #: 0.1 10*3/uL (ref 0.0–0.7)
Eosinophil %: 0.7 %
HCT: 34.7 % — ABNORMAL LOW (ref 35.0–47.0)
HGB: 10.9 g/dL — ABNORMAL LOW (ref 12.0–16.0)
Lymphocyte #: 3.1 10*3/uL (ref 1.0–3.6)
Lymphocyte %: 32.7 %
MCH: 24.4 pg — ABNORMAL LOW (ref 26.0–34.0)
MCHC: 31.3 g/dL — ABNORMAL LOW (ref 32.0–36.0)
MCV: 78 fL — ABNORMAL LOW (ref 80–100)
Monocyte #: 0.5 x10 3/mm (ref 0.2–0.9)
Monocyte %: 5.3 %
NEUTROS ABS: 5.7 10*3/uL (ref 1.4–6.5)
Neutrophil %: 61 %
Platelet: 206 10*3/uL (ref 150–440)
RBC: 4.45 10*6/uL (ref 3.80–5.20)
RDW: 15.2 % — AB (ref 11.5–14.5)
WBC: 9.3 10*3/uL (ref 3.6–11.0)

## 2013-12-25 NOTE — Progress Notes (Signed)
Patient is here today because she is unsure if she has a uti or not.  She is hurting pretty bad in her flank and wants to be sure.  Her urine is positive for blood only.  She has a history of kidney stones and agrees that this may be what is happening.  Her pain is significant  So she will go to the ER to be evaluated.  She also has not had a period and has previously discussed this with the physician.  She will make an appointment to talk to the doctor.

## 2013-12-30 ENCOUNTER — Ambulatory Visit: Payer: Medicaid Other | Admitting: Family Medicine

## 2014-01-27 ENCOUNTER — Inpatient Hospital Stay (HOSPITAL_COMMUNITY)
Admission: AD | Admit: 2014-01-27 | Discharge: 2014-01-28 | Disposition: A | Discharge status: Home / Self Care | Payer: Medicaid Other | Source: Ambulatory Visit | Attending: Family Medicine | Admitting: Family Medicine

## 2014-01-27 DIAGNOSIS — N926 Irregular menstruation, unspecified: Secondary | ICD-10-CM

## 2014-01-27 DIAGNOSIS — Z87891 Personal history of nicotine dependence: Secondary | ICD-10-CM | POA: Insufficient documentation

## 2014-01-27 DIAGNOSIS — R109 Unspecified abdominal pain: Secondary | ICD-10-CM | POA: Diagnosis not present

## 2014-01-27 DIAGNOSIS — N938 Other specified abnormal uterine and vaginal bleeding: Secondary | ICD-10-CM | POA: Diagnosis present

## 2014-01-27 DIAGNOSIS — N949 Unspecified condition associated with female genital organs and menstrual cycle: Secondary | ICD-10-CM | POA: Diagnosis present

## 2014-01-27 LAB — URINE MICROSCOPIC-ADD ON

## 2014-01-27 LAB — CBC
HCT: 33.9 % — ABNORMAL LOW (ref 36.0–46.0)
HEMOGLOBIN: 10.9 g/dL — AB (ref 12.0–15.0)
MCH: 25.1 pg — ABNORMAL LOW (ref 26.0–34.0)
MCHC: 32.2 g/dL (ref 30.0–36.0)
MCV: 77.9 fL — AB (ref 78.0–100.0)
PLATELETS: 173 10*3/uL (ref 150–400)
RBC: 4.35 MIL/uL (ref 3.87–5.11)
RDW: 15.5 % (ref 11.5–15.5)
WBC: 10 10*3/uL (ref 4.0–10.5)

## 2014-01-27 LAB — URINALYSIS, ROUTINE W REFLEX MICROSCOPIC
Bilirubin Urine: NEGATIVE
GLUCOSE, UA: NEGATIVE mg/dL
KETONES UR: NEGATIVE mg/dL
Nitrite: NEGATIVE
PROTEIN: NEGATIVE mg/dL
Specific Gravity, Urine: 1.01 (ref 1.005–1.030)
UROBILINOGEN UA: 0.2 mg/dL (ref 0.0–1.0)
pH: 7.5 (ref 5.0–8.0)

## 2014-01-27 LAB — POCT PREGNANCY, URINE: Preg Test, Ur: NEGATIVE

## 2014-01-27 NOTE — MAU Note (Signed)
Pt reports she went about 5 months without a period was evaluated at that time and was told everything was normal, then she had 2 months that were normal and then she started bleeding on 08/15 and has had bleeding everyday since then. States she will spot for a few days and then start back with heavy bleeding. Lower abd pain since bleeding started.

## 2014-01-28 ENCOUNTER — Encounter (HOSPITAL_COMMUNITY): Payer: Self-pay | Admitting: *Deleted

## 2014-01-28 DIAGNOSIS — N926 Irregular menstruation, unspecified: Secondary | ICD-10-CM

## 2014-01-28 LAB — WET PREP, GENITAL
CLUE CELLS WET PREP: NONE SEEN
Trich, Wet Prep: NONE SEEN
YEAST WET PREP: NONE SEEN

## 2014-01-28 NOTE — MAU Note (Signed)
Pt states she started having vaginal bleeding 01/03/2014. Pt states bleeding has stopped for a couple of days,but she still has some spotting at that time. Pt states abdominal pain in her " ovaries"  Pt states pain" is like when you ovulates and it is there all the time" Its like having a kidney stone."

## 2014-01-28 NOTE — MAU Note (Signed)
Not in lobby

## 2014-01-28 NOTE — Discharge Instructions (Signed)
Abnormal Uterine Bleeding Abnormal uterine bleeding can affect women at various stages in life, including teenagers, women in their reproductive years, pregnant women, and women who have reached menopause. Several kinds of uterine bleeding are considered abnormal, including:  Bleeding or spotting between periods.   Bleeding after sexual intercourse.   Bleeding that is heavier or more than normal.   Periods that last longer than usual.  Bleeding after menopause.  Many cases of abnormal uterine bleeding are minor and simple to treat, while others are more serious. Any type of abnormal bleeding should be evaluated by your health care provider. Treatment will depend on the cause of the bleeding. HOME CARE INSTRUCTIONS Monitor your condition for any changes. The following actions may help to alleviate any discomfort you are experiencing:  Avoid the use of tampons and douches as directed by your health care provider.  Change your pads frequently. You should get regular pelvic exams and Pap tests. Keep all follow-up appointments for diagnostic tests as directed by your health care provider.  SEEK MEDICAL CARE IF:   Your bleeding lasts more than 1 week.   You feel dizzy at times.  SEEK IMMEDIATE MEDICAL CARE IF:   You pass out.   You are changing pads every 15 to 30 minutes.   You have abdominal pain.  You have a fever.   You become sweaty or weak.   You are passing large blood clots from the vagina.   You start to feel nauseous and vomit. MAKE SURE YOU:   Understand these instructions.  Will watch your condition.  Will get help right away if you are not doing well or get worse. Document Released: 05/08/2005 Document Revised: 05/13/2013 Document Reviewed: 12/05/2012 ExitCare Patient Information 2015 [employer]. This information is not intended to replace advice given to you by your health care provider. Make sure you discuss any questions you have with your  health care provider.  

## 2014-01-28 NOTE — MAU Provider Note (Signed)
History     CSN: [id number]  Arrival date and time: 01/27/14 1949   First Provider Initiated Contact with Patient 01/28/14 [phone]      Chief Complaint  Patient presents with  . Vaginal Bleeding  . Abdominal Pain   Vaginal Bleeding Associated symptoms include abdominal pain.  Abdominal Pain    Natalie Duffy is a 33 y.o. [phone] who presents today with vaginal bleeding. She states that she started bleeding on 01/03/14. She has been seen at [facility] for a similar complaint, and was told everything was normal. She states that the after she was seen for that the bleeding stopped until now. She has not called the office. She is unsure of the number of pads she has used per day. She states that some days it can be heavy, and some days it is very light spotting only. She denies any pain today.   Past Medical History  Diagnosis Date  . Migraine   . Insomnia   . Abnormal Pap smear 2004  . Cancer 2004    cervical  . Interstitial cystitis   . Urinary tract infection   . Pyelonephritis   . Kidney stones   . Anxiety     Past Surgical History  Procedure Laterality Date  . Leep  2004  . Cone bx  2004  . Cystoscopy  2007  . Cesarean section  2005  . Hysteroscopy  2007  . Dilation and curettage of uterus    . Tubal ligation Bilateral 08/14/2012    Procedure: POST PARTUM TUBAL LIGATION;  Surgeon: Woodroe Mode, MD;  Location: [facility];  Service: Gynecology;  Laterality: Bilateral;    Family History  Problem Relation Age of Onset  . Hypertension Father   . Cancer Maternal Grandmother     lung/brain  . Cancer Paternal Grandfather     lung/prostate cancer    History  Substance Use Topics  . Smoking status: Former Smoker -- 0.25 packs/day for 15 years    Types: Cigarettes    Quit date: 01/15/2012  . Smokeless tobacco: Never Used  . Alcohol Use: No     Comment: seldom    Allergies:  Allergies  Allergen Reactions  . Vancomycin Anaphylaxis  . Procardia [Nifedipine]  Swelling  . Z-Pak [Azithromycin] Other (See Comments)    Reaction unknown; a childhood allergy.   . Amoxicillin Rash  . Clindamycin/Lincomycin Rash  . Penicillins Rash  . Sulfur Rash    Prescriptions prior to admission  Medication Sig Dispense Refill  . doxepin (SINEQUAN) 100 MG capsule Take 1 capsule (100 mg total) by mouth at bedtime.  30 capsule  11  . medroxyPROGESTERone (PROVERA) 10 MG tablet Take 1 tablet (10 mg total) by mouth daily. Use for ten days  10 tablet  2  . rizatriptan (MAXALT) 10 MG tablet Take 1 tablet (10 mg total) by mouth as needed for migraine. May repeat in 2 hours if needed  12 tablet  11    Review of Systems  Gastrointestinal: Positive for abdominal pain.  Genitourinary: Positive for vaginal bleeding.   Physical Exam   Blood pressure 120/77, pulse 77, temperature 98.7 F (37.1 C), temperature source Oral, resp. rate 16, height 5\' 3"  (1.6 m), weight 51.71 kg (114 lb), last menstrual period 01/03/2014, SpO2 99.00%, not currently breastfeeding.  Physical Exam  Nursing note and vitals reviewed. Constitutional: She is oriented to person, place, and time. She appears well-developed and well-nourished. No distress.  Cardiovascular: Normal rate.  Respiratory: Effort normal.  GI: Soft. There is no tenderness. There is no rebound.  Genitourinary:   External: no lesion Vagina: small amount of blood in the vagina  Cervix: pink, smooth, no CMT Uterus: NSSC Adnexa: NT   Neurological: She is alert and oriented to person, place, and time.  Skin: Skin is warm and dry.  Psychiatric: She has a normal mood and affect.    MAU Course  Procedures  Results for orders placed during the hospital encounter of 01/27/14 (from the past 24 hour(s))  URINALYSIS, ROUTINE W REFLEX MICROSCOPIC     Status: Abnormal   Collection Time    01/27/14  8:33 PM      Result Value Ref Range   Color, Urine YELLOW  YELLOW   APPearance CLEAR  CLEAR   Specific Gravity, Urine 1.010   1.005 - 1.030   pH 7.5  5.0 - 8.0   Glucose, UA NEGATIVE  NEGATIVE mg/dL   Hgb urine dipstick LARGE (*) NEGATIVE   Bilirubin Urine NEGATIVE  NEGATIVE   Ketones, ur NEGATIVE  NEGATIVE mg/dL   Protein, ur NEGATIVE  NEGATIVE mg/dL   Urobilinogen, UA 0.2  0.0 - 1.0 mg/dL   Nitrite NEGATIVE  NEGATIVE   Leukocytes, UA TRACE (*) NEGATIVE  URINE MICROSCOPIC-ADD ON     Status: Abnormal   Collection Time    01/27/14  8:33 PM      Result Value Ref Range   Squamous Epithelial / LPF FEW (*) RARE   WBC, UA 0-2  <3 WBC/hpf   RBC / HPF 11-20  <3 RBC/hpf   Bacteria, UA FEW (*) RARE   Urine-Other AMORPHOUS URATES/PHOSPHATES    POCT PREGNANCY, URINE     Status: None   Collection Time    01/27/14  8:53 PM      Result Value Ref Range   Preg Test, Ur NEGATIVE  NEGATIVE  CBC     Status: Abnormal   Collection Time    01/27/14  9:20 PM      Result Value Ref Range   WBC 10.0  4.0 - 10.5 K/uL   RBC 4.35  3.87 - 5.11 MIL/uL   Hemoglobin 10.9 (*) 12.0 - 15.0 g/dL   HCT 33.9 (*) 36.0 - 46.0 %   MCV 77.9 (*) 78.0 - 100.0 fL   MCH 25.1 (*) 26.0 - 34.0 pg   MCHC 32.2  30.0 - 36.0 g/dL   RDW 15.5  11.5 - 15.5 %   Platelets 173  150 - 400 K/uL    Assessment and Plan   1. Irregular menstrual cycle    GC/CT pending FU with the office as needed  Follow-up Information   Schedule an appointment as soon as possible for a visit with [facility] [place] [facility].   Specialty:  Obstetrics and Gynecology   Contact information:   [place] [address] [phone]       Mathis Bud 01/28/2014, 3:18 AM

## 2014-01-29 LAB — GC/CHLAMYDIA PROBE AMP
CT Probe RNA: NEGATIVE
GC Probe RNA: NEGATIVE

## 2014-01-29 NOTE — MAU Provider Note (Signed)
Attestation of Attending Supervision of Advanced Practitioner (PA/CNM/NP): Evaluation and management procedures were performed by the Advanced Practitioner under my supervision and collaboration.  I have reviewed the Advanced Practitioner's note and chart, and I agree with the management and plan.  Jacob Stinson, DO Attending Physician Faculty Practice, [facility]  

## 2014-02-05 ENCOUNTER — Telehealth: Payer: Self-pay | Admitting: *Deleted

## 2014-02-05 NOTE — Telephone Encounter (Signed)
Patient is bleeding heavily, soaking through a tampon before two hours.  Now she is bleeding so much that it is coming out like a "flood."  She did go to [facility] last week when the bleeding became heavy and they advised her to follow up with [place].  This did happen in 2007 but she has not had an episode since until this past week.  We have patient here for an appointment on Wednesday.  She has been bleeding like a normal menstral cycle for a week or so then nothing for a couple days then back to bleeding.  For two days it has been so heavy that she is soaking a tampon every 30 minutes to an hour, super tampons.

## 2014-02-08 ENCOUNTER — Inpatient Hospital Stay (HOSPITAL_COMMUNITY): Payer: Medicaid Other

## 2014-02-08 ENCOUNTER — Inpatient Hospital Stay (HOSPITAL_COMMUNITY)
Admission: AD | Admit: 2014-02-08 | Discharge: 2014-02-08 | Disposition: A | Discharge status: Home / Self Care | Payer: Medicaid Other | Source: Ambulatory Visit | Attending: Obstetrics and Gynecology | Admitting: Obstetrics and Gynecology

## 2014-02-08 ENCOUNTER — Encounter (HOSPITAL_COMMUNITY): Payer: Self-pay | Admitting: *Deleted

## 2014-02-08 DIAGNOSIS — N949 Unspecified condition associated with female genital organs and menstrual cycle: Secondary | ICD-10-CM

## 2014-02-08 DIAGNOSIS — N938 Other specified abnormal uterine and vaginal bleeding: Secondary | ICD-10-CM | POA: Insufficient documentation

## 2014-02-08 DIAGNOSIS — N925 Other specified irregular menstruation: Secondary | ICD-10-CM

## 2014-02-08 DIAGNOSIS — K5900 Constipation, unspecified: Secondary | ICD-10-CM | POA: Diagnosis not present

## 2014-02-08 DIAGNOSIS — Z87891 Personal history of nicotine dependence: Secondary | ICD-10-CM | POA: Diagnosis not present

## 2014-02-08 LAB — URINALYSIS, ROUTINE W REFLEX MICROSCOPIC
Bilirubin Urine: NEGATIVE
Glucose, UA: NEGATIVE mg/dL
Ketones, ur: NEGATIVE mg/dL
Leukocytes, UA: NEGATIVE
NITRITE: NEGATIVE
PH: 5.5 (ref 5.0–8.0)
Protein, ur: NEGATIVE mg/dL
Urobilinogen, UA: 0.2 mg/dL (ref 0.0–1.0)

## 2014-02-08 LAB — URINE MICROSCOPIC-ADD ON

## 2014-02-08 LAB — CBC
HCT: 33 % — ABNORMAL LOW (ref 36.0–46.0)
Hemoglobin: 10.8 g/dL — ABNORMAL LOW (ref 12.0–15.0)
MCH: 25.5 pg — AB (ref 26.0–34.0)
MCHC: 32.7 g/dL (ref 30.0–36.0)
MCV: 78 fL (ref 78.0–100.0)
PLATELETS: 198 10*3/uL (ref 150–400)
RBC: 4.23 MIL/uL (ref 3.87–5.11)
RDW: 15.9 % — AB (ref 11.5–15.5)
WBC: 9.7 10*3/uL (ref 4.0–10.5)

## 2014-02-08 LAB — POCT PREGNANCY, URINE: Preg Test, Ur: NEGATIVE

## 2014-02-08 MED ORDER — KETOROLAC TROMETHAMINE 60 MG/2ML IM SOLN
60.0000 mg | Freq: Once | INTRAMUSCULAR | Status: AC
  Administered 2014-02-08: 60 mg via INTRAMUSCULAR
  Filled 2014-02-08: qty 2

## 2014-02-08 MED ORDER — POLYETHYLENE GLYCOL 3350 17 G PO PACK: 17.0000 g | PACK | Freq: Every day | ORAL | Status: DC

## 2014-02-08 MED ORDER — MEDROXYPROGESTERONE ACETATE 10 MG PO TABS: 10.0000 mg | ORAL_TABLET | Freq: Every day | ORAL | Status: DC

## 2014-02-08 NOTE — MAU Provider Note (Signed)
Attestation of Attending Supervision of Advanced Practitioner (CNM/NP): Evaluation and management procedures were performed by the Advanced Practitioner under my supervision and collaboration.  I have reviewed the Advanced Practitioner's note and chart, and I agree with the management and plan.  Mieko Kneebone 02/08/2014 7:39 PM

## 2014-02-08 NOTE — MAU Note (Signed)
Patient presents with complaint of vaginal bleeding X 6 weeks that has gotten heavier X 2 weeks.

## 2014-02-08 NOTE — Discharge Instructions (Signed)
Abnormal Uterine Bleeding °Abnormal uterine bleeding can affect women at various stages in life, including teenagers, women in their reproductive years, pregnant women, and women who have reached menopause. Several kinds of uterine bleeding are considered abnormal, including: °· Bleeding or spotting between periods.   °· Bleeding after sexual intercourse.   °· Bleeding that is heavier or more than normal.   °· Periods that last longer than usual. °· Bleeding after menopause.   °Many cases of abnormal uterine bleeding are minor and simple to treat, while others are more serious. Any type of abnormal bleeding should be evaluated by your health care provider. Treatment will depend on the cause of the bleeding. °HOME CARE INSTRUCTIONS °Monitor your condition for any changes. The following actions may help to alleviate any discomfort you are experiencing: °· Avoid the use of tampons and douches as directed by your health care provider. °· Change your pads frequently. °You should get regular pelvic exams and Pap tests. Keep all follow-up appointments for diagnostic tests as directed by your health care provider.  °SEEK MEDICAL CARE IF:  °· Your bleeding lasts more than 1 week.   °· You feel dizzy at times.   °SEEK IMMEDIATE MEDICAL CARE IF:  °· You pass out.   °· You are changing pads every 15 to 30 minutes.   °· You have abdominal pain. °· You have a fever.   °· You become sweaty or weak.   °· You are passing large blood clots from the vagina.   °· You start to feel nauseous and vomit. °MAKE SURE YOU:  °· Understand these instructions. °· Will watch your condition. °· Will get help right away if you are not doing well or get worse. °Document Released: 05/08/2005 Document Revised: 05/13/2013 Document Reviewed: 12/05/2012 °ExitCare® Patient Information ©2015 [employer]. This information is not intended to replace advice given to you by your health care provider. Make sure you discuss any questions you have with your  health care provider. ° °Constipation °Constipation is when a person has fewer than three bowel movements a week, has difficulty having a bowel movement, or has stools that are dry, hard, or larger than normal. As people grow older, constipation is more common. If you try to fix constipation with medicines that make you have a bowel movement (laxatives), the problem may get worse. Long-term laxative use may cause the muscles of the colon to become weak. A low-fiber diet, not taking in enough fluids, and taking certain medicines may make constipation worse.  °CAUSES  °· Certain medicines, such as antidepressants, pain medicine, iron supplements, antacids, and water pills.   °· Certain diseases, such as diabetes, irritable bowel syndrome (IBS), thyroid disease, or depression.   °· Not drinking enough water.   °· Not eating enough fiber-rich foods.   °· Stress or travel.   °· Lack of physical activity or exercise.   °· Ignoring the urge to have a bowel movement.   °· Using laxatives too much.   °SIGNS AND SYMPTOMS  °· Having fewer than three bowel movements a week.   °· Straining to have a bowel movement.   °· Having stools that are hard, dry, or larger than normal.   °· Feeling full or bloated.   °· Pain in the lower abdomen.   °· Not feeling relief after having a bowel movement.   °DIAGNOSIS  °Your health care provider will take a medical history and perform a physical exam. Further testing may be done for severe constipation. Some tests may include: °· A barium enema X-ray to examine your rectum, colon, and, sometimes, your small intestine.   °· A sigmoidoscopy to   examine your lower colon.   °· A colonoscopy to examine your entire colon. °TREATMENT  °Treatment will depend on the severity of your constipation and what is causing it. Some dietary treatments include drinking more fluids and eating more fiber-rich foods. Lifestyle treatments may include regular exercise. If these diet and lifestyle recommendations do  not help, your health care provider may recommend taking over-the-counter laxative medicines to help you have bowel movements. Prescription medicines may be prescribed if over-the-counter medicines do not work.  °HOME CARE INSTRUCTIONS  °· Eat foods that have a lot of fiber, such as fruits, vegetables, whole grains, and beans. °· Limit foods high in fat and processed sugars, such as french fries, hamburgers, cookies, candies, and soda.   °· A fiber supplement may be added to your diet if you cannot get enough fiber from foods.   °· Drink enough fluids to keep your urine clear or pale yellow.   °· Exercise regularly or as directed by your health care provider.   °· Go to the restroom when you have the urge to go. Do not hold it.   °· Only take over-the-counter or prescription medicines as directed by your health care provider. Do not take other medicines for constipation without talking to your health care provider first.   °SEEK IMMEDIATE MEDICAL CARE IF:  °· You have bright red blood in your stool.   °· Your constipation lasts for more than 4 days or gets worse.   °· You have abdominal or rectal pain.   °· You have thin, pencil-like stools.   °· You have unexplained weight loss. °MAKE SURE YOU:  °· Understand these instructions. °· Will watch your condition. °· Will get help right away if you are not doing well or get worse. °Document Released: 02/04/2004 Document Revised: 05/13/2013 Document Reviewed: 02/17/2013 °ExitCare® Patient Information ©2015 [employer]. This information is not intended to replace advice given to you by your health care provider. Make sure you discuss any questions you have with your health care provider. ° °

## 2014-02-08 NOTE — MAU Provider Note (Signed)
History     CSN: [id number]  Arrival date and time: 02/08/14 1623   First Provider Initiated Contact with Patient 02/08/14 1729      Chief Complaint  Patient presents with  . Vaginal Bleeding   HPI 33 y.o. [id number] with vaginal bleeding x 6 weeks. States prior to this episode of bleeding she did not have a period for 5 months. She took a 10 day course of Provera a few months ago with no withdrawal bleed. + crampy pain. Pt was seen in MAU on 01/28/14 w/ same c/o, negative cultures and wet prep at that time, Hgb 10.9.   Past Medical History  Diagnosis Date  . Migraine   . Insomnia   . Abnormal Pap smear 2004  . Cancer 2004    cervical  . Interstitial cystitis   . Urinary tract infection   . Pyelonephritis   . Kidney stones   . Anxiety     Past Surgical History  Procedure Laterality Date  . Leep  2004  . [facility] bx  2004  . Cystoscopy  2007  . Cesarean section  2005  . Hysteroscopy  2007  . Dilation and curettage of uterus    . Tubal ligation Bilateral 08/14/2012    Procedure: POST PARTUM TUBAL LIGATION;  Surgeon: Woodroe Mode, MD;  Location: [facility];  Service: Gynecology;  Laterality: Bilateral;    Family History  Problem Relation Age of Onset  . Hypertension Father   . Cancer Maternal Grandmother     lung/brain  . Cancer Paternal Grandfather     lung/prostate cancer    History  Substance Use Topics  . Smoking status: Former Smoker -- 0.25 packs/day for 15 years    Types: Cigarettes    Quit date: 01/15/2012  . Smokeless tobacco: Never Used  . Alcohol Use: No     Comment: seldom    Allergies:  Allergies  Allergen Reactions  . Vancomycin Anaphylaxis  . Procardia [Nifedipine] Swelling  . Z-Pak [Azithromycin] Other (See Comments)    Reaction unknown; a childhood allergy.   . Amoxicillin Rash  . Clindamycin/Lincomycin Rash  . Penicillins Rash  . Sulfa Antibiotics Rash    Prescriptions prior to admission  Medication Sig Dispense Refill  . acetaminophen  (TYLENOL) 500 MG tablet Take 1,000 mg by mouth every 6 (six) hours as needed for mild pain.      Marland Kitchen doxepin (SINEQUAN) 100 MG capsule Take 1 capsule (100 mg total) by mouth at bedtime.  30 capsule  11  . ibuprofen (ADVIL,MOTRIN) 200 MG tablet Take 400 mg by mouth every 6 (six) hours as needed for cramping.      . rizatriptan (MAXALT) 10 MG tablet Take 1 tablet (10 mg total) by mouth as needed for migraine. May repeat in 2 hours if needed  12 tablet  11    Review of Systems  Constitutional: Negative.   Respiratory: Negative.   Cardiovascular: Negative.   Gastrointestinal: Negative for nausea, vomiting, abdominal pain, diarrhea and constipation.  Genitourinary: Negative for dysuria, urgency, frequency, hematuria and flank pain.       Positive bleeding   Musculoskeletal: Negative.   Neurological: Negative.   Psychiatric/Behavioral: Negative.    Physical Exam   Blood pressure 129/76, pulse 101, temperature 98.2 F (36.8 C), temperature source Oral, resp. rate 16, height 5\' 3"  (1.6 m), weight 114 lb (51.71 kg), last menstrual period 01/03/2014, not currently breastfeeding.  Physical Exam  Nursing note and vitals reviewed. Constitutional: She is oriented  to person, place, and time. She appears well-developed and well-nourished. No distress.  Cardiovascular: Normal rate.   Respiratory: Effort normal.  GI: Soft. There is no tenderness.  Genitourinary: Uterus is not deviated, not enlarged, not fixed and not tender. Cervix exhibits no motion tenderness, no discharge and no friability. Right adnexum displays no mass, no tenderness and no fullness. Left adnexum displays no mass, no tenderness and no fullness. There is bleeding (small) around the vagina. No tenderness around the vagina. No vaginal discharge found.  Large hard mass of stool palpable through posterior vaginal wall on exam  Musculoskeletal: Normal range of motion.  Neurological: She is alert and oriented to person, place, and time.   Skin: Skin is warm and dry.  Psychiatric: She has a normal mood and affect.    MAU Course  Procedures Results for orders placed during the hospital encounter of 02/08/14 (from the past 24 hour(s))  URINALYSIS, ROUTINE W REFLEX MICROSCOPIC     Status: Abnormal   Collection Time    02/08/14  4:30 PM      Result Value Ref Range   Color, Urine YELLOW  YELLOW   APPearance CLEAR  CLEAR   Specific Gravity, Urine >1.030 (*) 1.005 - 1.030   pH 5.5  5.0 - 8.0   Glucose, UA NEGATIVE  NEGATIVE mg/dL   Hgb urine dipstick SMALL (*) NEGATIVE   Bilirubin Urine NEGATIVE  NEGATIVE   Ketones, ur NEGATIVE  NEGATIVE mg/dL   Protein, ur NEGATIVE  NEGATIVE mg/dL   Urobilinogen, UA 0.2  0.0 - 1.0 mg/dL   Nitrite NEGATIVE  NEGATIVE   Leukocytes, UA NEGATIVE  NEGATIVE  URINE MICROSCOPIC-ADD ON     Status: Abnormal   Collection Time    02/08/14  4:30 PM      Result Value Ref Range   Squamous Epithelial / LPF RARE  RARE   WBC, UA 0-2  <3 WBC/hpf   RBC / HPF 3-6  <3 RBC/hpf   Bacteria, UA RARE  RARE   Crystals CA OXALATE CRYSTALS (*) NEGATIVE   Urine-Other MUCOUS PRESENT    CBC     Status: Abnormal   Collection Time    02/08/14  4:35 PM      Result Value Ref Range   WBC 9.7  4.0 - 10.5 K/uL   RBC 4.23  3.87 - 5.11 MIL/uL   Hemoglobin 10.8 (*) 12.0 - 15.0 g/dL   HCT 33.0 (*) 36.0 - 46.0 %   MCV 78.0  78.0 - 100.0 fL   MCH 25.5 (*) 26.0 - 34.0 pg   MCHC 32.7  30.0 - 36.0 g/dL   RDW 15.9 (*) 11.5 - 15.5 %   Platelets 198  150 - 400 K/uL  POCT PREGNANCY, URINE     Status: None   Collection Time    02/08/14  4:37 PM      Result Value Ref Range   Preg Test, Ur NEGATIVE  NEGATIVE   US Transvaginal Non-ob  02/08/2014   CLINICAL DATA:  Bleeding for 6 weeks.  Pain.  EXAM: TRANSABDOMINAL AND TRANSVAGINAL ULTRASOUND OF PELVIS  TECHNIQUE: Both transabdominal and transvaginal ultrasound examinations of the pelvis were performed. Transabdominal technique was performed for global imaging of the pelvis  including uterus, ovaries, adnexal regions, and pelvic cul-de-sac. It was necessary to proceed with endovaginal exam following the transabdominal exam to visualize the endometrium and adnexal structures.  COMPARISON:  None  FINDINGS: Uterus  Measurements: 6.9 x 4.4 x 6.4 cm. No  fibroids or other mass visualized.  Endometrium  Thickness: 4.4 mm.  No focal abnormality visualized.  The right and left ovary not visualized due to overlying bowel gas.  Other findings  No free fluid.  IMPRESSION: Grossly unremarkable endometrium. If bleeding remains unresponsive to hormonal or medical therapy, sonohysterogram should be considered for focal lesion work-up. (Ref: Radiological Reasoning: Algorithmic Workup of Abnormal Vaginal Bleeding with Endovaginal Sonography and Sonohysterography. AJR 2008; [id number]:[id number])  The ovaries are not visualized to overlying bowel gas.   Electronically Signed   By: Lovey Newcomer M.D.   On: 02/08/2014 19:01   US Pelvis Complete  02/08/2014   CLINICAL DATA:  Bleeding for 6 weeks.  Pain.  EXAM: TRANSABDOMINAL AND TRANSVAGINAL ULTRASOUND OF PELVIS  TECHNIQUE: Both transabdominal and transvaginal ultrasound examinations of the pelvis were performed. Transabdominal technique was performed for global imaging of the pelvis including uterus, ovaries, adnexal regions, and pelvic cul-de-sac. It was necessary to proceed with endovaginal exam following the transabdominal exam to visualize the endometrium and adnexal structures.  COMPARISON:  None  FINDINGS: Uterus  Measurements: 6.9 x 4.4 x 6.4 cm. No fibroids or other mass visualized.  Endometrium  Thickness: 4.4 mm.  No focal abnormality visualized.  The right and left ovary not visualized due to overlying bowel gas.  Other findings  No free fluid.  IMPRESSION: Grossly unremarkable endometrium. If bleeding remains unresponsive to hormonal or medical therapy, sonohysterogram should be considered for focal lesion work-up. (Ref: Radiological Reasoning:  Algorithmic Workup of Abnormal Vaginal Bleeding with Endovaginal Sonography and Sonohysterography. AJR 2008; [id number]:I37-04)  The ovaries are not visualized to overlying bowel gas.   Electronically Signed   By: Lovey Newcomer M.D.   On: 02/08/2014 19:01    Assessment and Plan   1. DUB (dysfunctional uterine bleeding)   2. Unspecified constipation   Provera to stop bleeding, continue until f/u office visit at [facility], pt to call to schedule Miralax for constipation    Medication List         acetaminophen 500 MG tablet  Commonly known as:  TYLENOL  Take 1,000 mg by mouth every 6 (six) hours as needed for mild pain.     doxepin 100 MG capsule  Commonly known as:  SINEQUAN  Take 1 capsule (100 mg total) by mouth at bedtime.     ibuprofen 200 MG tablet  Commonly known as:  ADVIL,MOTRIN  Take 400 mg by mouth every 6 (six) hours as needed for cramping.     medroxyPROGESTERone 10 MG tablet  Commonly known as:  PROVERA  Take 1 tablet (10 mg total) by mouth daily.     polyethylene glycol packet  Commonly known as:  MIRALAX  Take 17 g by mouth daily.     rizatriptan 10 MG tablet  Commonly known as:  MAXALT  Take 1 tablet (10 mg total) by mouth as needed for migraine. May repeat in 2 hours if needed        Follow-up Information   Schedule an appointment as soon as possible for a visit with [facility].   Specialty:  Obstetrics and Gynecology   Contact information:   [place] [phone]        [facility] 02/08/2014, 7:10 PM

## 2014-02-09 ENCOUNTER — Encounter: Payer: Self-pay | Admitting: Family Medicine

## 2014-02-09 ENCOUNTER — Ambulatory Visit (INDEPENDENT_AMBULATORY_CARE_PROVIDER_SITE_OTHER): Payer: Medicaid Other | Admitting: Family Medicine

## 2014-02-09 VITALS — BP 117/68 | HR 81 | Ht 63.0 in | Wt 119.0 lb

## 2014-02-09 DIAGNOSIS — N949 Unspecified condition associated with female genital organs and menstrual cycle: Secondary | ICD-10-CM

## 2014-02-09 DIAGNOSIS — N970 Female infertility associated with anovulation: Secondary | ICD-10-CM | POA: Insufficient documentation

## 2014-02-09 DIAGNOSIS — Z23 Encounter for immunization: Secondary | ICD-10-CM

## 2014-02-09 DIAGNOSIS — N925 Other specified irregular menstruation: Secondary | ICD-10-CM

## 2014-02-09 MED ORDER — NORETHIN-ETH ESTRAD-FE BIPHAS 1 MG-10 MCG / 10 MCG PO TABS: 1.0000 | ORAL_TABLET | Freq: Every day | ORAL | Status: DC

## 2014-02-09 NOTE — Patient Instructions (Addendum)
Endometrial Ablation Endometrial ablation removes the lining of the uterus (endometrium). It is usually a same-day, outpatient treatment. Ablation helps avoid major surgery, such as surgery to remove the cervix and uterus (hysterectomy). After endometrial ablation, you will have little or no menstrual bleeding and may not be able to have children. However, if you are premenopausal, you will need to use a reliable method of birth control following the procedure because of the small chance that pregnancy can occur. There are different reasons to have this procedure, which include:  Heavy periods.  Bleeding that is causing anemia.  Irregular bleeding.  Bleeding fibroids on the lining inside the uterus if they are smaller than 3 centimeters. This procedure should not be done if:  You want children in the future.  You have severe cramps with your menstrual period.  You have precancerous or cancerous cells in your uterus.  You were recently pregnant.  You have gone through menopause.  You have had major surgery on the uterus, such as a cesarean delivery. LET [facility] CARE PROVIDER KNOW ABOUT:  Any allergies you have.  All medicines you are taking, including vitamins, herbs, eye drops, creams, and over-the-counter medicines.  Previous problems you or members of your family have had with the use of anesthetics.  Any blood disorders you have.  Previous surgeries you have had.  Medical conditions you have. RISKS AND COMPLICATIONS  Generally, this is a safe procedure. However, as with any procedure, complications can occur. Possible complications include:  Perforation of the uterus.  Bleeding.  Infection of the uterus, bladder, or vagina.  Injury to surrounding organs.  An air bubble to the lung (air embolus).  Pregnancy following the procedure.  Failure of the procedure to help the problem, requiring hysterectomy.  Decreased ability to diagnose cancer in the lining of  the uterus. BEFORE THE PROCEDURE  The lining of the uterus must be tested to make sure there is no pre-cancerous or cancer cells present.  An ultrasound may be performed to look at the size of the uterus and to check for abnormalities.  Medicines may be given to thin the lining of the uterus. PROCEDURE  During the procedure, your health care provider will use a tool called a resectoscope to help see inside your uterus. There are different ways to remove the lining of your uterus.   Radiofrequency - This method uses a radiofrequency-alternating electric current to remove the lining of the uterus.  Cryotherapy - This method uses extreme cold to freeze the lining of the uterus.  Heated-Free Liquid - This method uses heated salt (saline) solution to remove the lining of the uterus.  Microwave - This method uses high-energy microwaves to heat up the lining of the uterus to remove it.  Thermal balloon - This method involves inserting a catheter with a balloon tip into the uterus. The balloon tip is filled with heated fluid to remove the lining of the uterus. AFTER THE PROCEDURE  After your procedure, do not have sexual intercourse or insert anything into your vagina until permitted by your health care provider. After the procedure, you may experience:  Cramps.  Vaginal discharge.  Frequent urination. Document Released: 03/17/2004 Document Revised: 01/08/2013 Document Reviewed: 10/09/2012 [facility] Patient Information 2015 [place], [place]. This information is not intended to replace advice given to you by your health care provider. Make sure you discuss any questions you have with your health care provider. Menorrhagia Menorrhagia is the medical term for when your menstrual periods are heavy  or last longer than usual. With menorrhagia, every period you have may cause enough blood loss and cramping that you are unable to maintain your usual activities. CAUSES  In some cases, the cause of  heavy periods is unknown, but a number of conditions may cause menorrhagia. Common causes include:  A problem with the hormone-producing thyroid gland (hypothyroid).  Noncancerous growths in the uterus (polyps or fibroids).  An imbalance of the estrogen and progesterone hormones.  One of your ovaries not releasing an egg during one or more months.  Side effects of having an intrauterine device (IUD).  Side effects of some medicines, such as anti-inflammatory medicines or blood thinners.  A bleeding disorder that stops your blood from clotting normally. SIGNS AND SYMPTOMS  During a normal period, bleeding lasts between 4 and 8 days. Signs that your periods are too heavy include:  You routinely have to change your pad or tampon every 1 or 2 hours because it is completely soaked.  You pass blood clots larger than 1 inch (2.5 cm) in size.  You have bleeding for more than 7 days.  You need to use pads and tampons at the same time because of heavy bleeding.  You need to wake up to change your pads or tampons during the night.  You have symptoms of anemia, such as tiredness, fatigue, or shortness of breath. DIAGNOSIS  Your health care provider will perform a physical exam and ask you questions about your symptoms and menstrual history. Other tests may be ordered based on what the health care provider finds during the exam. These tests can include:  Blood tests. Blood tests are used to check if you are pregnant or have hormonal changes, a bleeding or thyroid disorder, low iron levels (anemia), or other problems.  Endometrial biopsy. Your health care provider takes a sample of tissue from the inside of your uterus to be examined under a microscope.  Pelvic ultrasound. This test uses sound waves to make a picture of your uterus, ovaries, and vagina. The pictures can show if you have fibroids or other growths.  Hysteroscopy. For this test, your health care provider will use a small  telescope to look inside your uterus. Based on the results of your initial tests, your health care provider may recommend further testing. TREATMENT  Treatment may not be needed. If it is needed, your health care provider may recommend treatment with one or more medicines first. If these do not reduce bleeding enough, a surgical treatment might be an option. The best treatment for you will depend on:   Whether you need to prevent pregnancy.  Your desire to have children in the future.  The cause and severity of your bleeding.  Your opinion and personal preference.  Medicines for menorrhagia may include:  Birth control methods that use hormones. These include the pill, skin patch, vaginal ring, shots that you get every 3 months, hormonal IUD, and implant. These treatments reduce bleeding during your menstrual period.  Medicines that thicken blood and slow bleeding.  Medicines that reduce swelling, such as ibuprofen.  Medicines that contain a synthetic hormone called progestin.   Medicines that make the ovaries stop working for a short time.  You may need surgical treatment for menorrhagia if the medicines are unsuccessful. Treatment options include:  Dilation and curettage (D&C). In this procedure, your health care provider opens (dilates) your cervix and then scrapes or suctions tissue from the lining of your uterus to reduce menstrual bleeding.  Operative hysteroscopy.  This procedure uses a tiny tube with a light (hysteroscope) to view your uterine cavity and can help in the surgical removal of a polyp that may be causing heavy periods.  Endometrial ablation. Through various techniques, your health care provider permanently destroys the entire lining of your uterus (endometrium). After endometrial ablation, most women have little or no menstrual flow. Endometrial ablation reduces your ability to become pregnant.  Endometrial resection. This surgical procedure uses an  electrosurgical wire loop to remove the lining of the uterus. This procedure also reduces your ability to become pregnant.  Hysterectomy. Surgical removal of the uterus and cervix is a permanent procedure that stops menstrual periods. Pregnancy is not possible after a hysterectomy. This procedure requires anesthesia and hospitalization. HOME CARE INSTRUCTIONS   Only take over-the-counter or prescription medicines as directed by your health care provider. Take prescribed medicines exactly as directed. Do not change or switch medicines without consulting your health care provider.  Take any prescribed iron pills exactly as directed by your health care provider. Long-term heavy bleeding may result in low iron levels. Iron pills help replace the iron your body lost from heavy bleeding. Iron may cause constipation. If this becomes a problem, increase the bran, fruits, and roughage in your diet.  Do not take aspirin or medicines that contain aspirin 1 week before or during your menstrual period. Aspirin may make the bleeding worse.  If you need to change your sanitary pad or tampon more than once every 2 hours, stay in bed and rest as much as possible until the bleeding stops.  Eat well-balanced meals. Eat foods high in iron. Examples are leafy green vegetables, meat, liver, eggs, and whole grain breads and cereals. Do not try to lose weight until the abnormal bleeding has stopped and your blood iron level is back to normal. SEEK MEDICAL CARE IF:   You soak through a pad or tampon every 1 or 2 hours, and this happens every time you have a period.  You need to use pads and tampons at the same time because you are bleeding so much.  You need to change your pad or tampon during the night.  You have a period that lasts for more than 8 days.  You pass clots bigger than 1 inch wide.  You have irregular periods that happen more or less often than once a month.  You feel dizzy or faint.  You feel  very weak or tired.  You feel short of breath or feel your heart is beating too fast when you exercise.  You have nausea and vomiting or diarrhea while you are taking your medicine.  You have any problems that may be related to the medicine you are taking. SEEK IMMEDIATE MEDICAL CARE IF:   You soak through 4 or more pads or tampons in 2 hours.  You have any bleeding while you are pregnant. MAKE SURE YOU:   Understand these instructions.  Will watch your condition.  Will get help right away if you are not doing well or get worse. Document Released: 05/08/2005 Document Revised: 05/13/2013 Document Reviewed: 10/27/2012 [facility] Patient Information 2015 [place], [place]. This information is not intended to replace advice given to you by your health care provider. Make sure you discuss any questions you have with your health care provider.

## 2014-02-09 NOTE — Progress Notes (Signed)
Patient is having increased vaginal bleeding and cramping she has been bleeding off and on since August 15, mostly on and has gotten significantly heavier in the past week, where she is soaking through a tampon within two hours.  Worse when she is up moving around.

## 2014-02-09 NOTE — Assessment & Plan Note (Addendum)
All options discussed with patient.  At this point, she would like OC's.  Warned about age 34 and smoking risk.

## 2014-02-09 NOTE — Progress Notes (Signed)
    Subjective:    Patient ID: Natalie Duffy is a 33 y.o. female presenting with Menometrorrhagia  on 02/09/2014  HPI: Since BTL, has had very irregular cycles with missing cycles and then bleeding.  Seen in ED for bleeding and had pelvic sonogram which showed normal uterus and endometrium. Placed on provera--which she has not picked up.  Has stable hgb.  Review of Systems  Constitutional: Negative for fever and chills.  Respiratory: Negative for shortness of breath.   Cardiovascular: Negative for chest pain.  Gastrointestinal: Negative for nausea, vomiting and abdominal pain.  Genitourinary: Negative for dysuria.  Skin: Negative for rash.      Objective:    BP 117/68  Pulse 81  Ht 5\' 3"  (1.6 m)  Wt 119 lb (53.978 kg)  BMI 21.09 kg/m2  LMP 01/03/2014 Physical Exam  Constitutional: She is oriented to person, place, and time. She appears well-developed and well-nourished. No distress.  HENT:  Head: Normocephalic and atraumatic.  Eyes: No scleral icterus.  Neck: Neck supple.  Cardiovascular: Normal rate.   Pulmonary/Chest: Effort normal.  Abdominal: Soft.  Neurological: She is alert and oriented to person, place, and time.  Skin: Skin is warm and dry.  Psychiatric: She has a normal mood and affect.        Assessment & Plan:    Problem List Items Addressed This Visit     Unprioritized   Anovulatory bleeding     All options discussed with patient.  At this point, she would like OC's.  Warned about age 58 and smoking risk.    Relevant Medications      Norethindrone-Ethinyl Estradiol-Fe Biphas (LO LOESTRIN FE) 1 MG-10 MCG / 10 MCG tablet    Other Visit Diagnoses   Need for immunization against influenza    -  Primary    Relevant Orders       Flu Vaccine QUAD 36+ mos IM (Fluarix) (Completed)      Return in about 3 months (around 05/11/2014) for a follow-up.

## 2014-02-11 ENCOUNTER — Ambulatory Visit: Payer: Medicaid Other | Admitting: Advanced Practice Midwife

## 2014-03-23 ENCOUNTER — Encounter: Payer: Self-pay | Admitting: Family Medicine

## 2014-03-24 ENCOUNTER — Emergency Department: Payer: Self-pay | Admitting: Internal Medicine

## 2014-04-28 ENCOUNTER — Ambulatory Visit (INDEPENDENT_AMBULATORY_CARE_PROVIDER_SITE_OTHER): Payer: Medicaid Other | Admitting: Nurse Practitioner

## 2014-04-28 ENCOUNTER — Encounter: Payer: Self-pay | Admitting: Nurse Practitioner

## 2014-04-28 VITALS — BP 117/87 | HR 93 | Wt 120.0 lb

## 2014-04-28 DIAGNOSIS — G43009 Migraine without aura, not intractable, without status migrainosus: Secondary | ICD-10-CM

## 2014-04-28 MED ORDER — TRAMADOL HCL 50 MG PO TABS: 50.0000 mg | ORAL_TABLET | Freq: Four times a day (QID) | ORAL | Status: DC | PRN

## 2014-04-28 MED ORDER — LEVETIRACETAM 250 MG PO TABS: 250.0000 mg | ORAL_TABLET | Freq: Every day | ORAL | Status: DC

## 2014-04-28 MED ORDER — LEVETIRACETAM 250 MG PO TABS: 250.0000 mg | ORAL_TABLET | Freq: Two times a day (BID) | ORAL | Status: DC

## 2014-04-28 MED ORDER — PROMETHAZINE HCL 25 MG PO TABS: 25.0000 mg | ORAL_TABLET | Freq: Four times a day (QID) | ORAL | Status: DC | PRN

## 2014-04-28 NOTE — Patient Instructions (Signed)

## 2014-04-28 NOTE — Progress Notes (Signed)
History:  Natalie Duffy is a 33 y.o. [phone] who presents to [facility] clinic today for follow up with migraines. She was last seen April 2014. Since then she has done very well until she experienced dysfunctional vaginal bleeding and was put on birth control pills. She has done well with the pills but she believes she is having more headaches. She does not want to come off the pills. She does not have aura.   The following portions of the patient's history were reviewed and updated as appropriate: allergies, current medications, past family history, past medical history, past social history, past surgical history and problem list.  Review of Systems:    Objective:  Physical Exam BP 117/87 mmHg  Pulse 93  Wt 120 lb (54.432 kg)  Breastfeeding? No GENERAL: Well-developed, well-nourished female in no acute distress.  HEENT: Normocephalic, atraumatic.  NECK: Supple. Normal thyroid.  LUNGS: Normal rate. Clear to auscultation bilaterally.  HEART: Regular rate and rhythm with no adventitious sounds.  EXTREMITIES: No cyanosis, clubbing, or edema, 2+ distal pulses.   Labs and Imaging No results found.  Assessment & Plan:  Assessment:  Migraine without aura  Plans:  Offered other forms of birth control to help with bleeding/ declines Add Keppra 250 mg po qhs Phenergan/ Ultram for rescue RTC 6 weeks or prn  Olegario Messier, NP 04/28/2014 3:38 PM

## 2014-06-04 ENCOUNTER — Emergency Department: Payer: Self-pay | Admitting: Emergency Medicine

## 2014-06-04 LAB — CBC WITH DIFFERENTIAL/PLATELET
BASOS ABS: 0.1 10*3/uL (ref 0.0–0.1)
Basophil %: 0.7 %
Eosinophil #: 0.1 10*3/uL (ref 0.0–0.7)
Eosinophil %: 1.2 %
HCT: 37.1 % (ref 35.0–47.0)
HGB: 11.5 g/dL — ABNORMAL LOW (ref 12.0–16.0)
LYMPHS ABS: 3 10*3/uL (ref 1.0–3.6)
Lymphocyte %: 36.7 %
MCH: 23.9 pg — ABNORMAL LOW (ref 26.0–34.0)
MCHC: 31 g/dL — AB (ref 32.0–36.0)
MCV: 77 fL — ABNORMAL LOW (ref 80–100)
Monocyte #: 0.5 x10 3/mm (ref 0.2–0.9)
Monocyte %: 6 %
NEUTROS PCT: 55.4 %
Neutrophil #: 4.5 10*3/uL (ref 1.4–6.5)
Platelet: 205 10*3/uL (ref 150–440)
RBC: 4.8 10*6/uL (ref 3.80–5.20)
RDW: 16.3 % — AB (ref 11.5–14.5)
WBC: 8.1 10*3/uL (ref 3.6–11.0)

## 2014-06-04 LAB — URINALYSIS, COMPLETE
Bacteria: NONE SEEN
Bilirubin,UR: NEGATIVE
Blood: NEGATIVE
GLUCOSE, UR: NEGATIVE mg/dL (ref 0–75)
LEUKOCYTE ESTERASE: NEGATIVE
NITRITE: NEGATIVE
Ph: 5 (ref 4.5–8.0)
Protein: 30
Specific Gravity: 1.026 (ref 1.003–1.030)
Squamous Epithelial: 2
WBC UR: 2 /HPF (ref 0–5)

## 2014-06-04 LAB — COMPREHENSIVE METABOLIC PANEL
ALK PHOS: 49 U/L
ALT: 14 U/L
AST: 21 U/L (ref 15–37)
Albumin: 4.1 g/dL (ref 3.4–5.0)
Anion Gap: 6 — ABNORMAL LOW (ref 7–16)
BUN: 11 mg/dL (ref 7–18)
Bilirubin,Total: 0.5 mg/dL (ref 0.2–1.0)
CHLORIDE: 106 mmol/L (ref 98–107)
Calcium, Total: 8.6 mg/dL (ref 8.5–10.1)
Co2: 26 mmol/L (ref 21–32)
Creatinine: 0.59 mg/dL — ABNORMAL LOW (ref 0.60–1.30)
Glucose: 89 mg/dL (ref 65–99)
Osmolality: 275 (ref 275–301)
Potassium: 3.8 mmol/L (ref 3.5–5.1)
Sodium: 138 mmol/L (ref 136–145)
TOTAL PROTEIN: 7.5 g/dL (ref 6.4–8.2)

## 2014-06-04 LAB — HCG, QUANTITATIVE, PREGNANCY

## 2014-06-04 LAB — PREGNANCY, URINE: PREGNANCY TEST, URINE: NEGATIVE m[IU]/mL

## 2014-06-04 LAB — LIPASE, BLOOD: Lipase: 119 U/L (ref 73–393)

## 2014-06-09 ENCOUNTER — Encounter: Payer: Self-pay | Admitting: Nurse Practitioner

## 2014-06-09 ENCOUNTER — Ambulatory Visit (INDEPENDENT_AMBULATORY_CARE_PROVIDER_SITE_OTHER): Payer: Medicaid Other | Admitting: Nurse Practitioner

## 2014-06-09 VITALS — BP 136/81 | HR 101 | Ht 63.0 in | Wt 115.4 lb

## 2014-06-09 DIAGNOSIS — N8320 Unspecified ovarian cysts: Secondary | ICD-10-CM | POA: Diagnosis not present

## 2014-06-09 DIAGNOSIS — G43009 Migraine without aura, not intractable, without status migrainosus: Secondary | ICD-10-CM

## 2014-06-09 DIAGNOSIS — R109 Unspecified abdominal pain: Secondary | ICD-10-CM

## 2014-06-09 DIAGNOSIS — N83202 Unspecified ovarian cyst, left side: Secondary | ICD-10-CM | POA: Insufficient documentation

## 2014-06-09 LAB — POCT URINALYSIS DIPSTICK
Bilirubin, UA: NEGATIVE
Blood, UA: NEGATIVE
Glucose, UA: NEGATIVE
Ketones, UA: NEGATIVE
LEUKOCYTES UA: NEGATIVE
NITRITE UA: NEGATIVE
PH UA: 6
Protein, UA: NEGATIVE
Spec Grav, UA: >=1.03
UROBILINOGEN UA: NEGATIVE

## 2014-06-09 NOTE — Progress Notes (Signed)
History:  Natalie Duffy is a 34 y.o. [phone] who presents to [facility] clinic today for follow up with migraines. She is doing very well with Keppra and feels her migraines are in control. She has been experiencing left lower quad pain following intercourse. The pain is so intense she feels she may faint.  The following portions of the patient's history were reviewed and updated as appropriate: allergies, current medications, past family history, past medical history, past social history, past surgical history and problem list.  Review of Systems:    Objective:  Physical Exam BP 136/81 mmHg  Pulse 101  Ht 5\' 3"  (1.6 m)  Wt 115 lb 6.4 oz (52.345 kg)  BMI 20.45 kg/m2  Breastfeeding? No GENERAL: Well-developed, well-nourished female in no acute distress.  HEENT: Normocephalic, atraumatic.  NECK: Supple. Normal thyroid.  LUNGS: Normal rate. Clear to auscultation bilaterally.  HEART: Regular rate and rhythm with no adventitious sounds.  EXTREMITIES: No cyanosis, clubbing, or edema, 2+ distal pulses.   Labs and Imaging  Ultrasound shows left ovarian cyst that is collapsing  Assessment & Plan:  Assessment: Migraine  Left ovarian cyst  Plans: Advised to continue with keppra Advised to refrain from intercourse for few weeks Use Motrin/ Ultram/ heat  for pain  RTC 3-6 months  Olegario Messier, NP 06/09/2014 11:02 AM

## 2014-06-09 NOTE — Patient Instructions (Signed)

## 2014-06-09 NOTE — Progress Notes (Signed)
Patient is having significant flank pain today as well as dizzy and shakiness.  She would like her urine checked to be sure she doesn't have a kidney infection.  Her urine is negative for any signs of infection.  She is here for a follow up of her migraine headaches.  Bedside ultrasound shows 1.16cm irregular shaped cyst on the Left ovary.  The right ovary has several small follicles of no significance.

## 2014-07-09 ENCOUNTER — Emergency Department: Payer: Self-pay | Admitting: Emergency Medicine

## 2014-07-30 ENCOUNTER — Telehealth: Payer: Self-pay | Admitting: *Deleted

## 2014-07-30 DIAGNOSIS — G43009 Migraine without aura, not intractable, without status migrainosus: Secondary | ICD-10-CM

## 2014-07-30 MED ORDER — TRAMADOL HCL 50 MG PO TABS: 50.0000 mg | ORAL_TABLET | Freq: Four times a day (QID) | ORAL | Status: DC | PRN

## 2014-07-30 NOTE — Telephone Encounter (Signed)
Received a refill request from patients pharmacy for Tramadol.  I have sent patient in a refill for this medication.  Patient will need to be seen if she would like anymore refills for this medication in the future.

## 2014-08-11 ENCOUNTER — Other Ambulatory Visit: Payer: Self-pay | Admitting: Family Medicine

## 2014-08-11 ENCOUNTER — Ambulatory Visit (INDEPENDENT_AMBULATORY_CARE_PROVIDER_SITE_OTHER): Payer: Medicaid Other | Admitting: Family Medicine

## 2014-08-11 ENCOUNTER — Encounter: Payer: Self-pay | Admitting: Family Medicine

## 2014-08-11 VITALS — BP 125/84 | HR 96 | Ht 63.0 in | Wt 118.6 lb

## 2014-08-11 DIAGNOSIS — N970 Female infertility associated with anovulation: Secondary | ICD-10-CM

## 2014-08-11 DIAGNOSIS — N938 Other specified abnormal uterine and vaginal bleeding: Secondary | ICD-10-CM

## 2014-08-11 DIAGNOSIS — N898 Other specified noninflammatory disorders of vagina: Secondary | ICD-10-CM

## 2014-08-11 NOTE — Progress Notes (Signed)
    Subjective:    Patient ID: Natalie Duffy is a 34 y.o. female presenting with Vaginal Bleeding and vaginal odor  on 08/11/2014  HPI: Began with vaginal spotting 2 wks ago.Marland Kitchen Now with vaginal odor. Denies vaginal discharge or irritation. Notes some cramping on the left side began prior to spotting.  Skips placebo.  Currently on 2nd week of pills.  Review of Systems  Constitutional: Negative for fever and chills.  Respiratory: Negative for shortness of breath.   Cardiovascular: Negative for chest pain.  Gastrointestinal: Negative for nausea, vomiting and abdominal pain.  Genitourinary: Negative for dysuria.  Skin: Negative for rash.      Objective:    BP 125/84 mmHg  Pulse 96  Ht 5\' 3"  (1.6 m)  Wt 118 lb 9.6 oz (53.797 kg)  BMI 21.01 kg/m2 Physical Exam  Constitutional: She is oriented to person, place, and time. She appears well-developed and well-nourished. No distress.  HENT:  Head: Normocephalic and atraumatic.  Eyes: No scleral icterus.  Neck: Neck supple.  Cardiovascular: Normal rate.   Pulmonary/Chest: Effort normal.  Abdominal: Soft.  Genitourinary: Vagina normal.  No blood noted  Neurological: She is alert and oriented to person, place, and time.  Skin: Skin is warm and dry.  Psychiatric: She has a normal mood and affect.        Assessment & Plan:   Problem List Items Addressed This Visit      Unprioritized   Anovulatory bleeding   Relevant Orders   GC/Chlamydia Probe Amp    Other Visit Diagnoses    Vaginal odor    -  Primary    Relevant Orders    Wet prep, genital (Completed)        Return in about 3 months (around 11/11/2014).

## 2014-08-11 NOTE — Patient Instructions (Signed)

## 2014-08-12 LAB — WET PREP, GENITAL
Trich, Wet Prep: NONE SEEN
Yeast Wet Prep HPF POC: NONE SEEN

## 2014-08-12 LAB — GC/CHLAMYDIA PROBE AMP
CT Probe RNA: NEGATIVE
GC PROBE AMP APTIMA: NEGATIVE

## 2014-08-17 ENCOUNTER — Telehealth: Payer: Self-pay | Admitting: *Deleted

## 2014-08-17 DIAGNOSIS — B9689 Other specified bacterial agents as the cause of diseases classified elsewhere: Secondary | ICD-10-CM

## 2014-08-17 DIAGNOSIS — N760 Acute vaginitis: Principal | ICD-10-CM

## 2014-08-17 MED ORDER — METRONIDAZOLE 500 MG PO TABS: 500.0000 mg | ORAL_TABLET | Freq: Two times a day (BID) | ORAL | Status: DC

## 2014-08-17 NOTE — Telephone Encounter (Signed)
Pt aware.  I have sent in medication.

## 2014-08-17 NOTE — Telephone Encounter (Signed)
-----   Message from Donnamae Jude, MD sent at 08/12/2014  8:07 AM EDT ----- Has BV--needs Flagyl 500 mg bid x 7 d # 14 no RF

## 2014-10-02 ENCOUNTER — Telehealth: Payer: Self-pay | Admitting: *Deleted

## 2014-10-02 ENCOUNTER — Encounter: Payer: Self-pay | Admitting: *Deleted

## 2014-10-02 NOTE — Telephone Encounter (Signed)
Patient is requesting a refill on her Doxepin 100mg .

## 2014-10-15 ENCOUNTER — Telehealth: Payer: Self-pay | Admitting: *Deleted

## 2014-10-15 ENCOUNTER — Encounter: Payer: Self-pay | Admitting: *Deleted

## 2014-10-15 MED ORDER — DOXEPIN HCL 100 MG PO CAPS: 100.0000 mg | ORAL_CAPSULE | Freq: Every day | ORAL | Status: DC

## 2014-10-15 MED ORDER — RIZATRIPTAN BENZOATE 10 MG PO TABS: 10.0000 mg | ORAL_TABLET | ORAL | Status: DC | PRN

## 2014-10-15 NOTE — Telephone Encounter (Signed)
Pharmacy says they do not have refills of doxepin and maxalt.

## 2014-11-11 ENCOUNTER — Ambulatory Visit: Payer: Medicaid Other | Admitting: Family Medicine

## 2015-04-20 ENCOUNTER — Other Ambulatory Visit: Payer: Self-pay | Admitting: Physician Assistant

## 2015-04-21 NOTE — Telephone Encounter (Signed)
Pt needs appt

## 2015-08-27 ENCOUNTER — Other Ambulatory Visit: Payer: Self-pay | Admitting: Physician Assistant

## 2015-10-31 ENCOUNTER — Emergency Department (HOSPITAL_COMMUNITY)
Admission: EM | Admit: 2015-10-31 | Discharge: 2015-11-01 | Disposition: A | Discharge status: Home / Self Care | Payer: Medicaid Other | Attending: Emergency Medicine | Admitting: Emergency Medicine

## 2015-10-31 ENCOUNTER — Encounter (HOSPITAL_COMMUNITY): Payer: Self-pay | Admitting: *Deleted

## 2015-10-31 ENCOUNTER — Emergency Department (HOSPITAL_COMMUNITY): Payer: Medicaid Other

## 2015-10-31 DIAGNOSIS — Z791 Long term (current) use of non-steroidal anti-inflammatories (NSAID): Secondary | ICD-10-CM | POA: Diagnosis not present

## 2015-10-31 DIAGNOSIS — F17210 Nicotine dependence, cigarettes, uncomplicated: Secondary | ICD-10-CM | POA: Insufficient documentation

## 2015-10-31 DIAGNOSIS — N939 Abnormal uterine and vaginal bleeding, unspecified: Secondary | ICD-10-CM | POA: Diagnosis present

## 2015-10-31 DIAGNOSIS — Z79899 Other long term (current) drug therapy: Secondary | ICD-10-CM | POA: Diagnosis not present

## 2015-10-31 DIAGNOSIS — R102 Pelvic and perineal pain: Secondary | ICD-10-CM | POA: Insufficient documentation

## 2015-10-31 DIAGNOSIS — N970 Female infertility associated with anovulation: Secondary | ICD-10-CM | POA: Diagnosis not present

## 2015-10-31 DIAGNOSIS — Z8541 Personal history of malignant neoplasm of cervix uteri: Secondary | ICD-10-CM | POA: Diagnosis not present

## 2015-10-31 LAB — COMPREHENSIVE METABOLIC PANEL
ALK PHOS: 59 U/L (ref 38–126)
ALT: 11 U/L — AB (ref 14–54)
AST: 15 U/L (ref 15–41)
Albumin: 4.2 g/dL (ref 3.5–5.0)
Anion gap: 9 (ref 5–15)
BUN: 13 mg/dL (ref 6–20)
CALCIUM: 8.8 mg/dL — AB (ref 8.9–10.3)
CHLORIDE: 103 mmol/L (ref 101–111)
CO2: 25 mmol/L (ref 22–32)
Creatinine, Ser: 0.62 mg/dL (ref 0.44–1.00)
GFR calc non Af Amer: >60 mL/min (ref >60)
Glucose, Bld: 118 mg/dL — ABNORMAL HIGH (ref 65–99)
Potassium: 3.6 mmol/L (ref 3.5–5.1)
SODIUM: 137 mmol/L (ref 135–145)
Total Bilirubin: 0.9 mg/dL (ref 0.3–1.2)
Total Protein: 6.7 g/dL (ref 6.5–8.1)

## 2015-10-31 LAB — CBC
HCT: 36.8 % (ref 36.0–46.0)
Hemoglobin: 11.7 g/dL — ABNORMAL LOW (ref 12.0–15.0)
MCH: 25.3 pg — AB (ref 26.0–34.0)
MCHC: 31.8 g/dL (ref 30.0–36.0)
MCV: 79.7 fL (ref 78.0–100.0)
PLATELETS: 205 10*3/uL (ref 150–400)
RBC: 4.62 MIL/uL (ref 3.87–5.11)
RDW: 13 % (ref 11.5–15.5)
WBC: 6.3 10*3/uL (ref 4.0–10.5)

## 2015-10-31 LAB — WET PREP, GENITAL
Sperm: NONE SEEN
Trich, Wet Prep: NONE SEEN
Yeast Wet Prep HPF POC: NONE SEEN

## 2015-10-31 LAB — LIPASE, BLOOD: LIPASE: 26 U/L (ref 11–51)

## 2015-10-31 LAB — POC URINE PREG, ED: PREG TEST UR: NEGATIVE

## 2015-10-31 NOTE — ED Provider Notes (Signed)
CSN: [id number]     Arrival date & time 10/31/15  2029 History   First MD Initiated Contact with Patient 10/31/15 2208     Chief Complaint  Patient presents with  . Vaginal Bleeding     (Consider location/radiation/quality/duration/timing/severity/associated sxs/prior Treatment) HPI Comments: Patient presents with complaint of vaginal bleeding for the last 2 weeks and onset of left lower pelvic pain for the past 2 days. Patient has a history of irregular periods. History of a LEEP procedure performed approximately 10 years ago. Patient has routine GYN follow-up and care. She is not currently on any hormones or birth control. She has had nausea but no vomiting. No diarrhea or urinary symptoms. No fevers. Patient feels generally fatigued but has not had any lightheadedness or syncope. No shortness of breath. Patient has required blood transfusion in the past. Onset of symptoms acute. Course is constant. Nothing makes symptoms better or worse.  Patient is a 35 y.o. female presenting with vaginal bleeding. The history is provided by the patient.  Vaginal Bleeding Associated symptoms: fatigue and nausea   Associated symptoms: no abdominal pain, no dysuria, no fever and no vaginal discharge     Past Medical History  Diagnosis Date  . Migraine   . Insomnia   . Abnormal Pap smear 2004  . Cancer ([place]) 2004    cervical  . Interstitial cystitis   . Urinary tract infection   . Pyelonephritis   . Kidney stones   . Anxiety    Past Surgical History  Procedure Laterality Date  . Leep  2004  . Cone bx  2004  . Cystoscopy  2007  . Cesarean section  2005  . Hysteroscopy  2007  . Dilation and curettage of uterus    . Tubal ligation Bilateral 08/14/2012    Procedure: POST PARTUM TUBAL LIGATION;  Surgeon: Woodroe Mode, MD;  Location: [facility];  Service: Gynecology;  Laterality: Bilateral;   Family History  Problem Relation Age of Onset  . Hypertension Father   . Cancer Maternal Grandmother      lung/brain  . Cancer Paternal Grandfather     lung/prostate cancer   Social History  Substance Use Topics  . Smoking status: Current Some Day Smoker -- 0.25 packs/day for 15 years    Types: Cigarettes    Last Attempt to Quit: 01/15/2012  . Smokeless tobacco: Never Used  . Alcohol Use: No     Comment: seldom   OB History    Gravida Para Term Preterm AB TAB SAB Ectopic Multiple Living   5 4 2 2 1  1   4      Review of Systems  Constitutional: Positive for fatigue. Negative for fever.  HENT: Negative for rhinorrhea and sore throat.   Eyes: Negative for redness.  Respiratory: Negative for cough.   Cardiovascular: Negative for chest pain.  Gastrointestinal: Positive for nausea. Negative for vomiting, abdominal pain and diarrhea.  Genitourinary: Positive for vaginal bleeding and menstrual problem. Negative for dysuria and vaginal discharge.  Musculoskeletal: Negative for myalgias.  Skin: Negative for rash.  Neurological: Negative for headaches.   Allergies  Vancomycin; Procardia; Z-pak; Amoxicillin; Clindamycin/lincomycin; Penicillins; and Sulfa antibiotics  Home Medications   Prior to Admission medications   Medication Sig Start Date End Date Taking? Authorizing Provider  acetaminophen (TYLENOL) 500 MG tablet Take 1,000 mg by mouth every 6 (six) hours as needed for mild pain.    Historical Provider, MD  doxepin (SINEQUAN) 100 MG capsule Take 1 capsule (100  mg total) by mouth at bedtime. 10/15/14   Paticia Stack, PA-C  ibuprofen (ADVIL,MOTRIN) 200 MG tablet Take 400 mg by mouth every 6 (six) hours as needed for cramping.    Historical Provider, MD  levETIRAcetam (KEPPRA) 250 MG tablet Take 1 tablet (250 mg total) by mouth at bedtime. 04/28/14   Olegario Messier, NP  metroNIDAZOLE (FLAGYL) 500 MG tablet Take 1 tablet (500 mg total) by mouth 2 (two) times daily. 08/17/14   Donnamae Jude, MD  Norethindrone-Ethinyl Estradiol-Fe Biphas (LO LOESTRIN FE) 1 MG-10 MCG / 10 MCG tablet  Take 1 tablet by mouth daily. 02/09/14   Donnamae Jude, MD  promethazine (PHENERGAN) 25 MG tablet TAKE 1 TABLET BY MOUTH EVERY 6 HOURS AS NEEDED FOR NAUSEA OR VOMITING 04/21/15   Paticia Stack, PA-C  rizatriptan (MAXALT) 10 MG tablet Take 1 tablet (10 mg total) by mouth as needed for migraine. May repeat in 2 hours if needed 10/15/14   Paticia Stack, PA-C  traMADol (ULTRAM) 50 MG tablet Take 1 tablet (50 mg total) by mouth every 6 (six) hours as needed. 07/30/14   Collene Leyden Teague Clark, PA-C   BP 134/89 mmHg  Pulse 83  Temp(Src) 98.7 F (37.1 C)  Resp 16  Ht 5\' 3"  (1.6 m)  Wt 55.084 kg  BMI 21.52 kg/m2  SpO2 98%  LMP 10/31/2015   Physical Exam  Constitutional: She appears well-developed and well-nourished.  HENT:  Head: Normocephalic and atraumatic.  Eyes: Conjunctivae are normal. Right eye exhibits no discharge. Left eye exhibits no discharge.  Neck: Normal range of motion. Neck supple.  Cardiovascular: Normal rate, regular rhythm and normal heart sounds.   Pulmonary/Chest: Effort normal and breath sounds normal.  Abdominal: Soft. There is no tenderness.  Genitourinary: There is no rash or tenderness on the right labia. There is no rash or tenderness on the left labia. Uterus is tender (mild). Uterus is not enlarged. Cervix exhibits discharge. Cervix exhibits no motion tenderness and no friability. Right adnexum displays no mass and no tenderness. Left adnexum displays tenderness (moderate). Left adnexum displays no mass. There is bleeding in the vagina. Vaginal discharge (brown, mild) found.  Neurological: She is alert.  Skin: Skin is warm and dry.  Psychiatric: She has a normal mood and affect.  Nursing note and vitals reviewed.   ED Course  Procedures (including critical care time) Labs Review Labs Reviewed  WET PREP, GENITAL - Abnormal; Notable for the following:    Clue Cells Wet Prep HPF POC PRESENT (*)    WBC, Wet Prep HPF POC MANY (*)    All other components  within normal limits  COMPREHENSIVE METABOLIC PANEL - Abnormal; Notable for the following:    Glucose, Bld 118 (*)    Calcium 8.8 (*)    ALT 11 (*)    All other components within normal limits  CBC - Abnormal; Notable for the following:    Hemoglobin 11.7 (*)    MCH 25.3 (*)    All other components within normal limits  URINALYSIS, ROUTINE W REFLEX MICROSCOPIC (NOT AT [facility]) - Abnormal; Notable for the following:    APPearance CLOUDY (*)    Hgb urine dipstick LARGE (*)    All other components within normal limits  URINE MICROSCOPIC-ADD ON - Abnormal; Notable for the following:    Squamous Epithelial / LPF 6-30 (*)    Bacteria, UA RARE (*)    Crystals CA OXALATE CRYSTALS (*)  All other components within normal limits  LIPASE, BLOOD  POC URINE PREG, ED  GC/CHLAMYDIA PROBE AMP ([place]) NOT AT [facility]    Imaging Review US Transvaginal Non-ob  10/31/2015  CLINICAL DATA:  Left greater than right abdominal pain for 2 days. Heavy periods for 3 days. Previous history of urinary tract infection and cervical cancer. EXAM: TRANSABDOMINAL AND TRANSVAGINAL ULTRASOUND OF PELVIS TECHNIQUE: Both transabdominal and transvaginal ultrasound examinations of the pelvis were performed. Transabdominal technique was performed for global imaging of the pelvis including uterus, ovaries, adnexal regions, and pelvic cul-de-sac. It was necessary to proceed with endovaginal exam following the transabdominal exam to visualize the endometrium. COMPARISON:  CT abdomen and pelvis 06/04/2014. Ultrasound pelvis 02/08/2014. FINDINGS: Uterus Measurements: 6.7 x 3.6 x 6.1 cm. Uterus is anteverted. No fibroids or other mass visualized. Endometrium Thickness: 4 mm.  No focal abnormality visualized. Right ovary Measurements: 1.6 x 1.5 x 1.4 cm. Normal appearance/no adnexal mass. Left ovary Measurements: 2 x 1.1 x 1.4 cm. Normal appearance/no adnexal mass. Other findings No abnormal free fluid. IMPRESSION: Normal examination.  Electronically Signed   By: Lucienne Capers M.D.   On: 10/31/2015 23:57   US Pelvis Complete  10/31/2015  CLINICAL DATA:  Left greater than right abdominal pain for 2 days. Heavy periods for 3 days. Previous history of urinary tract infection and cervical cancer. EXAM: TRANSABDOMINAL AND TRANSVAGINAL ULTRASOUND OF PELVIS TECHNIQUE: Both transabdominal and transvaginal ultrasound examinations of the pelvis were performed. Transabdominal technique was performed for global imaging of the pelvis including uterus, ovaries, adnexal regions, and pelvic cul-de-sac. It was necessary to proceed with endovaginal exam following the transabdominal exam to visualize the endometrium. COMPARISON:  CT abdomen and pelvis 06/04/2014. Ultrasound pelvis 02/08/2014. FINDINGS: Uterus Measurements: 6.7 x 3.6 x 6.1 cm. Uterus is anteverted. No fibroids or other mass visualized. Endometrium Thickness: 4 mm.  No focal abnormality visualized. Right ovary Measurements: 1.6 x 1.5 x 1.4 cm. Normal appearance/no adnexal mass. Left ovary Measurements: 2 x 1.1 x 1.4 cm. Normal appearance/no adnexal mass. Other findings No abnormal free fluid. IMPRESSION: Normal examination. Electronically Signed   By: Lucienne Capers M.D.   On: 10/31/2015 23:57   I have personally reviewed and evaluated these images and lab results as part of my medical decision-making.  Patient seen and examined. Work-up initiated.    Vital signs reviewed and are as follows: BP 134/89 mmHg  Pulse 83  Temp(Src) 98.7 F (37.1 C)  Resp 16  Ht 5\' 3"  (1.6 m)  Wt 55.084 kg  BMI 21.52 kg/m2  SpO2 98%  LMP 10/31/2015  Pelvic exam performed. RN chaperone present.  12:30 AM patient updated on results. At this point, will discharge home with symptomatic treatment. Patient to call her GYN physician tomorrow to set up follow-up appointment. Home with tramadol, naproxen, Zofran. Patient to return to the emergency department with worsening pain, fever, new symptoms,  passing out, bleeding through more than 1 pad per hour. Or other concerns. She verbalizes understanding and agrees with plan.  Patient counseled on use of narcotic pain medications. Counseled not to combine these medications with others containing tylenol. Urged not to drink alcohol, drive, or perform any other activities that requires focus while taking these medications. The patient verbalizes understanding and agrees with the plan.   MDM   Final diagnoses:  Anovulatory bleeding  Pelvic pain in female   Anovulatory bleeding: Bleeding currently mild on exam. Hemoglobin is just below normal. Vital signs are stable. No pregnancy. Ultrasound  is reassuring.  Pelvic pain: Pain localizes to the left adnexa. No diarrhea or other symptoms concerning for diverticulitis. Patient has a history of kidney stones and calcium oxalate crystals noted in urine, however nature of pain is not suggestive of kidney stone. Hematuria likely contamination. CBC is normal and other labs are reassuring. No indication for CT scan at this time.   Carlisle Cater, PA-C 11/01/15 [id number]  Isla Pence, MD 11/03/15 [phone]

## 2015-10-31 NOTE — ED Notes (Signed)
The pt is c/o abd pain for 2 days.  Her last normal period was in April and she has had 2 periods in may.  Heavier period for past 3 days.

## 2015-11-01 LAB — URINALYSIS, ROUTINE W REFLEX MICROSCOPIC
BILIRUBIN URINE: NEGATIVE
Glucose, UA: NEGATIVE mg/dL
KETONES UR: NEGATIVE mg/dL
Leukocytes, UA: NEGATIVE
NITRITE: NEGATIVE
Protein, ur: NEGATIVE mg/dL
SPECIFIC GRAVITY, URINE: 1.025 (ref 1.005–1.030)
pH: 6 (ref 5.0–8.0)

## 2015-11-01 LAB — GC/CHLAMYDIA PROBE AMP (~~LOC~~) NOT AT ARMC
CHLAMYDIA, DNA PROBE: NEGATIVE
NEISSERIA GONORRHEA: NEGATIVE

## 2015-11-01 LAB — URINE MICROSCOPIC-ADD ON

## 2015-11-01 MED ORDER — ONDANSETRON 4 MG PO TBDP: 4.0000 mg | ORAL_TABLET | Freq: Three times a day (TID) | ORAL | Status: DC | PRN

## 2015-11-01 MED ORDER — NAPROXEN 500 MG PO TABS: 500.0000 mg | ORAL_TABLET | Freq: Two times a day (BID) | ORAL | Status: DC

## 2015-11-01 MED ORDER — TRAMADOL HCL 50 MG PO TABS: 50.0000 mg | ORAL_TABLET | Freq: Four times a day (QID) | ORAL | Status: DC | PRN

## 2015-11-01 NOTE — ED Notes (Signed)
Patient able to ambulate independently  

## 2015-11-01 NOTE — ED Notes (Signed)
PA at bedside updating pt 

## 2015-11-01 NOTE — Discharge Instructions (Signed)
Please read and follow all provided instructions.  Your diagnoses today include:  1. Anovulatory bleeding   2. Vagina bleeding   3. Pelvic pain in female     Tests performed today include:  Blood counts and electrolytes - Slightly low red blood cell count  Blood tests to check liver and kidney function - normal  Blood tests to check pancreas function - normal  Urine test to look for infection and pregnancy (in women) - no infection  Ultrasound - shows normal ovaries and uterus, no other problems  Vital signs. See below for your results today.   Medications prescribed:   Zofran (ondansetron) - for nausea and vomiting   Tramadol - narcotic-like pain medication  DO NOT drive or perform any activities that require you to be awake and alert because this medicine can make you drowsy.    Naproxen - anti-inflammatory pain medication  Do not exceed 500mg  naproxen every 12 hours, take with food  You have been prescribed an anti-inflammatory medication or NSAID. Take with food. Take smallest effective dose for the shortest duration needed for your pain. Stop taking if you experience stomach pain or vomiting.   Take any prescribed medications only as directed.  Home care instructions:   Follow any educational materials contained in this packet.  Follow-up instructions: Please follow-up with your primary care provider in the next 5 days for further evaluation of your symptoms.    Return instructions:  SEEK IMMEDIATE MEDICAL ATTENTION IF:  The pain does not go away or becomes severe   A temperature above 101F develops   Repeated vomiting occurs (multiple episodes)   The pain becomes localized to portions of the abdomen. The right side could possibly be appendicitis. In an adult, the left lower portion of the abdomen could be colitis or diverticulitis.   Blood is being passed in stools or vomit (bright red or black tarry stools)   You develop chest pain, difficulty  breathing, dizziness or fainting, or become confused, poorly responsive, or inconsolable (young children)  If you have any other emergent concerns regarding your health  Additional Information: Abdominal (belly) pain can be caused by many things. Your caregiver performed an examination and possibly ordered blood/urine tests and imaging (CT scan, x-rays, ultrasound). Many cases can be observed and treated at home after initial evaluation in the emergency department. Even though you are being discharged home, abdominal pain can be unpredictable. Therefore, you need a repeated exam if your pain does not resolve, returns, or worsens. Most patients with abdominal pain don't have to be admitted to the hospital or have surgery, but serious problems like appendicitis and gallbladder attacks can start out as nonspecific pain. Many abdominal conditions cannot be diagnosed in one visit, so follow-up evaluations are very important.  Your vital signs today were: BP 112/83 mmHg   Pulse 75   Temp(Src) 98.7 F (37.1 C)   Resp 16   Ht 5\' 3"  (1.6 m)   Wt 55.084 kg   BMI 21.52 kg/m2   SpO2 99%   LMP 10/31/2015 If your blood pressure (bp) was elevated above 135/85 this visit, please have this repeated by your doctor within one month. --------------

## 2015-11-29 ENCOUNTER — Other Ambulatory Visit: Payer: Self-pay | Admitting: Physician Assistant

## 2015-12-28 ENCOUNTER — Ambulatory Visit (INDEPENDENT_AMBULATORY_CARE_PROVIDER_SITE_OTHER): Payer: Medicaid Other | Admitting: Family Medicine

## 2015-12-28 ENCOUNTER — Other Ambulatory Visit (HOSPITAL_COMMUNITY)
Admission: RE | Admit: 2015-12-28 | Discharge: 2015-12-28 | Disposition: A | Discharge status: Home / Self Care | Payer: Medicaid Other | Source: Ambulatory Visit | Attending: Family Medicine | Admitting: Family Medicine

## 2015-12-28 ENCOUNTER — Encounter: Payer: Self-pay | Admitting: Family Medicine

## 2015-12-28 VITALS — BP 119/79 | HR 61 | Resp 18 | Ht 63.0 in | Wt 119.0 lb

## 2015-12-28 DIAGNOSIS — Z1151 Encounter for screening for human papillomavirus (HPV): Secondary | ICD-10-CM | POA: Diagnosis not present

## 2015-12-28 DIAGNOSIS — Z01419 Encounter for gynecological examination (general) (routine) without abnormal findings: Secondary | ICD-10-CM | POA: Diagnosis not present

## 2015-12-28 DIAGNOSIS — Z0000 Encounter for general adult medical examination without abnormal findings: Secondary | ICD-10-CM

## 2015-12-28 DIAGNOSIS — G43009 Migraine without aura, not intractable, without status migrainosus: Secondary | ICD-10-CM | POA: Diagnosis not present

## 2015-12-28 DIAGNOSIS — N970 Female infertility associated with anovulation: Secondary | ICD-10-CM

## 2015-12-28 DIAGNOSIS — Z8041 Family history of malignant neoplasm of ovary: Secondary | ICD-10-CM

## 2015-12-28 DIAGNOSIS — F419 Anxiety disorder, unspecified: Secondary | ICD-10-CM | POA: Diagnosis not present

## 2015-12-28 DIAGNOSIS — Z124 Encounter for screening for malignant neoplasm of cervix: Secondary | ICD-10-CM

## 2015-12-28 MED ORDER — DOXEPIN HCL 100 MG PO CAPS: 100.0000 mg | ORAL_CAPSULE | Freq: Every day | ORAL | 3 refills | Status: DC

## 2015-12-28 MED ORDER — DOXEPIN HCL 10 MG PO CAPS: 100.0000 mg | ORAL_CAPSULE | Freq: Every day | ORAL | Status: DC

## 2015-12-28 MED ORDER — RIZATRIPTAN BENZOATE 10 MG PO TABS: 10.0000 mg | ORAL_TABLET | ORAL | 11 refills | Status: AC | PRN

## 2015-12-28 NOTE — Patient Instructions (Addendum)
Preventive Care for Adults, Female A healthy lifestyle and preventive care can promote health and wellness. Preventive health guidelines for women include the following key practices.  A routine yearly physical is a good way to check with your health care provider about your health and preventive screening. It is a chance to share any concerns and updates on your health and to receive a thorough exam.  Visit your dentist for a routine exam and preventive care every 6 months. Brush your teeth twice a day and floss once a day. Good oral hygiene prevents tooth decay and gum disease.  The frequency of eye exams is based on your age, health, family medical history, use of contact lenses, and other factors. Follow your health care provider's recommendations for frequency of eye exams.  Eat a healthy diet. Foods like vegetables, fruits, whole grains, low-fat dairy products, and lean protein foods contain the nutrients you need without too many calories. Decrease your intake of foods high in solid fats, added sugars, and salt. Eat the right amount of calories for you.Get information about a proper diet from your health care provider, if necessary.  Regular physical exercise is one of the most important things you can do for your health. Most adults should get at least 150 minutes of moderate-intensity exercise (any activity that increases your heart rate and causes you to sweat) each week. In addition, most adults need muscle-strengthening exercises on 2 or more days a week.  Maintain a healthy weight. The body mass index (BMI) is a screening tool to identify possible weight problems. It provides an estimate of body fat based on height and weight. Your health care provider can find your BMI and can help you achieve or maintain a healthy weight.For adults 20 years and older:  A BMI below 18.5 is considered underweight.  A BMI of 18.5 to 24.9 is normal.  A BMI of 25 to 29.9 is considered overweight.  A  BMI of 30 and above is considered obese.  Maintain normal blood lipids and cholesterol levels by exercising and minimizing your intake of saturated fat. Eat a balanced diet with plenty of fruit and vegetables. Blood tests for lipids and cholesterol should begin at age 40 and be repeated every 5 years. If your lipid or cholesterol levels are high, you are over 50, or you are at high risk for heart disease, you may need your cholesterol levels checked more frequently.Ongoing high lipid and cholesterol levels should be treated with medicines if diet and exercise are not working.  If you smoke, find out from your health care provider how to quit. If you do not use tobacco, do not start.  Lung cancer screening is recommended for adults aged 65-80 years who are at high risk for developing lung cancer because of a history of smoking. A yearly low-dose CT scan of the lungs is recommended for people who have at least a 30-pack-year history of smoking and are a current smoker or have quit within the past 15 years. A pack year of smoking is smoking an average of 1 pack of cigarettes a day for 1 year (for example: 1 pack a day for 30 years or 2 packs a day for 15 years). Yearly screening should continue until the smoker has stopped smoking for at least 15 years. Yearly screening should be stopped for people who develop a health problem that would prevent them from having lung cancer treatment.  If you are pregnant, do not drink alcohol. If you are  breastfeeding, be very cautious about drinking alcohol. If you are not pregnant and choose to drink alcohol, do not have more than 1 drink per day. One drink is considered to be 12 ounces (355 mL) of beer, 5 ounces (148 mL) of wine, or 1.5 ounces (44 mL) of liquor.  Avoid use of street drugs. Do not share needles with anyone. Ask for help if you need support or instructions about stopping the use of drugs.  High blood pressure causes heart disease and increases the risk  of stroke. Your blood pressure should be checked at least every 1 to 2 years. Ongoing high blood pressure should be treated with medicines if weight loss and exercise do not work.  If you are 83-44 years old, ask your health care provider if you should take aspirin to prevent strokes.  Diabetes screening is done by taking a blood sample to check your blood glucose level after you have not eaten for a certain period of time (fasting). If you are not overweight and you do not have risk factors for diabetes, you should be screened once every 3 years starting at age 27. If you are overweight or obese and you are 83-63 years of age, you should be screened for diabetes every year as part of your cardiovascular risk assessment.  Breast cancer screening is essential preventive care for women. You should practice "breast self-awareness." This means understanding the normal appearance and feel of your breasts and may include breast self-examination. Any changes detected, no matter how small, should be reported to a health care provider. Women in their 45s and 30s should have a clinical breast exam (CBE) by a health care provider as part of a regular health exam every 1 to 3 years. After age 25, women should have a CBE every year. Starting at age 78, women should consider having a mammogram (breast X-ray test) every year. Women who have a family history of breast cancer should talk to their health care provider about genetic screening. Women at a high risk of breast cancer should talk to their health care providers about having an MRI and a mammogram every year.  Breast cancer gene (BRCA)-related cancer risk assessment is recommended for women who have family members with BRCA-related cancers. BRCA-related cancers include breast, ovarian, tubal, and peritoneal cancers. Having family members with these cancers may be associated with an increased risk for harmful changes (mutations) in the breast cancer genes BRCA1 and  BRCA2. Results of the assessment will determine the need for genetic counseling and BRCA1 and BRCA2 testing.  Your health care provider may recommend that you be screened regularly for cancer of the pelvic organs (ovaries, uterus, and vagina). This screening involves a pelvic examination, including checking for microscopic changes to the surface of your cervix (Pap test). You may be encouraged to have this screening done every 3 years, beginning at age 67.  For women ages 37-65, health care providers may recommend pelvic exams and Pap testing every 3 years, or they may recommend the Pap and pelvic exam, combined with testing for human papilloma virus (HPV), every 5 years. Some types of HPV increase your risk of cervical cancer. Testing for HPV may also be done on women of any age with unclear Pap test results.  Other health care providers may not recommend any screening for nonpregnant women who are considered low risk for pelvic cancer and who do not have symptoms. Ask your health care provider if a screening pelvic exam is right for  you.  If you have had past treatment for cervical cancer or a condition that could lead to cancer, you need Pap tests and screening for cancer for at least 20 years after your treatment. If Pap tests have been discontinued, your risk factors (such as having a new sexual partner) need to be reassessed to determine if screening should resume. Some women have medical problems that increase the chance of getting cervical cancer. In these cases, your health care provider may recommend more frequent screening and Pap tests.  Colorectal cancer can be detected and often prevented. Most routine colorectal cancer screening begins at the age of 76 years and continues through age 35 years. However, your health care provider may recommend screening at an earlier age if you have risk factors for colon cancer. On a yearly basis, your health care provider may provide home test kits to check  for hidden blood in the stool. Use of a small camera at the end of a tube, to directly examine the colon (sigmoidoscopy or colonoscopy), can detect the earliest forms of colorectal cancer. Talk to your health care provider about this at age 26, when routine screening begins. Direct exam of the colon should be repeated every 5-10 years through age 57 years, unless early forms of precancerous polyps or small growths are found.  People who are at an increased risk for hepatitis B should be screened for this virus. You are considered at high risk for hepatitis B if:  You were born in a country where hepatitis B occurs often. Talk with your health care provider about which countries are considered high risk.  Your parents were born in a high-risk country and you have not received a shot to protect against hepatitis B (hepatitis B vaccine).  You have HIV or AIDS.  You use needles to inject street drugs.  You live with, or have sex with, someone who has hepatitis B.  You get hemodialysis treatment.  You take certain medicines for conditions like cancer, organ transplantation, and autoimmune conditions.  Hepatitis C blood testing is recommended for all people born from [birth year] through 1965 and any individual with known risks for hepatitis C.  Practice safe sex. Use condoms and avoid high-risk sexual practices to reduce the spread of sexually transmitted infections (STIs). STIs include gonorrhea, chlamydia, syphilis, trichomonas, herpes, HPV, and human immunodeficiency virus (HIV). Herpes, HIV, and HPV are viral illnesses that have no cure. They can result in disability, cancer, and death.  You should be screened for sexually transmitted illnesses (STIs) including gonorrhea and chlamydia if:  You are sexually active and are younger than 24 years.  You are older than 24 years and your health care provider tells you that you are at risk for this type of infection.  Your sexual activity has changed  since you were last screened and you are at an increased risk for chlamydia or gonorrhea. Ask your health care provider if you are at risk.  If you are at risk of being infected with HIV, it is recommended that you take a prescription medicine daily to prevent HIV infection. This is called preexposure prophylaxis (PrEP). You are considered at risk if:  You are sexually active and do not regularly use condoms or know the HIV status of your partner(s).  You take drugs by injection.  You are sexually active with a partner who has HIV.  Talk with your health care provider about whether you are at high risk of being infected with HIV. If  you choose to begin PrEP, you should first be tested for HIV. You should then be tested every 3 months for as long as you are taking PrEP.  Osteoporosis is a disease in which the bones lose minerals and strength with aging. This can result in serious bone fractures or breaks. The risk of osteoporosis can be identified using a bone density scan. Women ages 67 years and over and women at risk for fractures or osteoporosis should discuss screening with their health care providers. Ask your health care provider whether you should take a calcium supplement or vitamin D to reduce the rate of osteoporosis.  Menopause can be associated with physical symptoms and risks. Hormone replacement therapy is available to decrease symptoms and risks. You should talk to your health care provider about whether hormone replacement therapy is right for you.  Use sunscreen. Apply sunscreen liberally and repeatedly throughout the day. You should seek shade when your shadow is shorter than you. Protect yourself by wearing long sleeves, pants, a wide-brimmed hat, and sunglasses year round, whenever you are outdoors.  Once a month, do a whole body skin exam, using a mirror to look at the skin on your back. Tell your health care provider of new moles, moles that have irregular borders, moles that  are larger than a pencil eraser, or moles that have changed in shape or color.  Stay current with required vaccines (immunizations).  Influenza vaccine. All adults should be immunized every year.  Tetanus, diphtheria, and acellular pertussis (Td, Tdap) vaccine. Pregnant women should receive 1 dose of Tdap vaccine during each pregnancy. The dose should be obtained regardless of the length of time since the last dose. Immunization is preferred during the 27th-36th week of gestation. An adult who has not previously received Tdap or who does not know her vaccine status should receive 1 dose of Tdap. This initial dose should be followed by tetanus and diphtheria toxoids (Td) booster doses every 10 years. Adults with an unknown or incomplete history of completing a 3-dose immunization series with Td-containing vaccines should begin or complete a primary immunization series including a Tdap dose. Adults should receive a Td booster every 10 years.  Varicella vaccine. An adult without evidence of immunity to varicella should receive 2 doses or a second dose if she has previously received 1 dose. Pregnant females who do not have evidence of immunity should receive the first dose after pregnancy. This first dose should be obtained before leaving the health care facility. The second dose should be obtained 4-8 weeks after the first dose.  Human papillomavirus (HPV) vaccine. Females aged 13-26 years who have not received the vaccine previously should obtain the 3-dose series. The vaccine is not recommended for use in pregnant females. However, pregnancy testing is not needed before receiving a dose. If a female is found to be pregnant after receiving a dose, no treatment is needed. In that case, the remaining doses should be delayed until after the pregnancy. Immunization is recommended for any person with an immunocompromised condition through the age of 61 years if she did not get any or all doses earlier. During the  3-dose series, the second dose should be obtained 4-8 weeks after the first dose. The third dose should be obtained 24 weeks after the first dose and 16 weeks after the second dose.  Zoster vaccine. One dose is recommended for adults aged 30 years or older unless certain conditions are present.  Measles, mumps, and rubella (MMR) vaccine. Adults born  before 1957 generally are considered immune to measles and mumps. Adults born in [birth year] or later should have 1 or more doses of MMR vaccine unless there is a contraindication to the vaccine or there is laboratory evidence of immunity to each of the three diseases. A routine second dose of MMR vaccine should be obtained at least 28 days after the first dose for students attending postsecondary schools, health care workers, or international travelers. People who received inactivated measles vaccine or an unknown type of measles vaccine during 1963-1967 should receive 2 doses of MMR vaccine. People who received inactivated mumps vaccine or an unknown type of mumps vaccine before 1979 and are at high risk for mumps infection should consider immunization with 2 doses of MMR vaccine. For females of childbearing age, rubella immunity should be determined. If there is no evidence of immunity, females who are not pregnant should be vaccinated. If there is no evidence of immunity, females who are pregnant should delay immunization until after pregnancy. Unvaccinated health care workers born before [birth year] who lack laboratory evidence of measles, mumps, or rubella immunity or laboratory confirmation of disease should consider measles and mumps immunization with 2 doses of MMR vaccine or rubella immunization with 1 dose of MMR vaccine.  Pneumococcal 13-valent conjugate (PCV13) vaccine. When indicated, a person who is uncertain of his immunization history and has no record of immunization should receive the PCV13 vaccine. All adults 91 years of age and older should receive this  vaccine. An adult aged 52 years or older who has certain medical conditions and has not been previously immunized should receive 1 dose of PCV13 vaccine. This PCV13 should be followed with a dose of pneumococcal polysaccharide (PPSV23) vaccine. Adults who are at high risk for pneumococcal disease should obtain the PPSV23 vaccine at least 8 weeks after the dose of PCV13 vaccine. Adults older than 34 years of age who have normal immune system function should obtain the PPSV23 vaccine dose at least 1 year after the dose of PCV13 vaccine.  Pneumococcal polysaccharide (PPSV23) vaccine. When PCV13 is also indicated, PCV13 should be obtained first. All adults aged 23 years and older should be immunized. An adult younger than age 57 years who has certain medical conditions should be immunized. Any person who resides in a nursing home or long-term care facility should be immunized. An adult smoker should be immunized. People with an immunocompromised condition and certain other conditions should receive both PCV13 and PPSV23 vaccines. People with human immunodeficiency virus (HIV) infection should be immunized as soon as possible after diagnosis. Immunization during chemotherapy or radiation therapy should be avoided. Routine use of PPSV23 vaccine is not recommended for American Indians, [place] Natives, or people younger than 65 years unless there are medical conditions that require PPSV23 vaccine. When indicated, people who have unknown immunization and have no record of immunization should receive PPSV23 vaccine. One-time revaccination 5 years after the first dose of PPSV23 is recommended for people aged 19-64 years who have chronic kidney failure, nephrotic syndrome, asplenia, or immunocompromised conditions. People who received 1-2 doses of PPSV23 before age 60 years should receive another dose of PPSV23 vaccine at age 74 years or later if at least 5 years have passed since the previous dose. Doses of PPSV23 are not  needed for people immunized with PPSV23 at or after age 74 years.  Meningococcal vaccine. Adults with asplenia or persistent complement component deficiencies should receive 2 doses of quadrivalent meningococcal conjugate (MenACWY-D) vaccine. The doses should be obtained  at least 2 months apart. Microbiologists working with certain meningococcal bacteria, [place] recruits, people at risk during an outbreak, and people who travel to or live in countries with a high rate of meningitis should be immunized. A first-year college student up through age 34 years who is living in a residence hall should receive a dose if she did not receive a dose on or after her 16th birthday. Adults who have certain high-risk conditions should receive one or more doses of vaccine.  Hepatitis A vaccine. Adults who wish to be protected from this disease, have certain high-risk conditions, work with hepatitis A-infected animals, work in hepatitis A research labs, or travel to or work in countries with a high rate of hepatitis A should be immunized. Adults who were previously unvaccinated and who anticipate close contact with an international adoptee during the first 60 days after arrival in the [place] from a country with a high rate of hepatitis A should be immunized.  Hepatitis B vaccine. Adults who wish to be protected from this disease, have certain high-risk conditions, may be exposed to blood or other infectious body fluids, are household contacts or sex partners of hepatitis B positive people, are clients or workers in certain care facilities, or travel to or work in countries with a high rate of hepatitis B should be immunized.  Haemophilus influenzae type b (Hib) vaccine. A previously unvaccinated person with asplenia or sickle cell disease or having a scheduled splenectomy should receive 1 dose of Hib vaccine. Regardless of previous immunization, a recipient of a hematopoietic stem cell transplant should receive a  3-dose series 6-12 months after her successful transplant. Hib vaccine is not recommended for adults with HIV infection. Preventive Services / Frequency Ages 35 to 4 years  Blood pressure check.** / Every 3-5 years.  Lipid and cholesterol check.** / Every 5 years beginning at age 60.  Clinical breast exam.** / Every 3 years for women in their 71s and 10s.  BRCA-related cancer risk assessment.** / For women who have family members with a BRCA-related cancer (breast, ovarian, tubal, or peritoneal cancers).  Pap test.** / Every 2 years from ages 76 through 26. Every 3 years starting at age 61 through age 76 or 93 with a history of 3 consecutive normal Pap tests.  HPV screening.** / Every 3 years from ages 37 through ages 60 to 51 with a history of 3 consecutive normal Pap tests.  Hepatitis C blood test.** / For any individual with known risks for hepatitis C.  Skin self-exam. / Monthly.  Influenza vaccine. / Every year.  Tetanus, diphtheria, and acellular pertussis (Tdap, Td) vaccine.** / Consult your health care provider. Pregnant women should receive 1 dose of Tdap vaccine during each pregnancy. 1 dose of Td every 10 years.  Varicella vaccine.** / Consult your health care provider. Pregnant females who do not have evidence of immunity should receive the first dose after pregnancy.  HPV vaccine. / 3 doses over 6 months, if 93 and younger. The vaccine is not recommended for use in pregnant females. However, pregnancy testing is not needed before receiving a dose.  Measles, mumps, rubella (MMR) vaccine.** / You need at least 1 dose of MMR if you were born in 1957 or later. You may also need a 2nd dose. For females of childbearing age, rubella immunity should be determined. If there is no evidence of immunity, females who are not pregnant should be vaccinated. If there is no evidence of immunity, females who are  pregnant should delay immunization until after pregnancy.  Pneumococcal  13-valent conjugate (PCV13) vaccine.** / Consult your health care provider.  Pneumococcal polysaccharide (PPSV23) vaccine.** / 1 to 2 doses if you smoke cigarettes or if you have certain conditions.  Meningococcal vaccine.** / 1 dose if you are age 103 to 33 years and a Market researcher living in a residence hall, or have one of several medical conditions, you need to get vaccinated against meningococcal disease. You may also need additional booster doses.  Hepatitis A vaccine.** / Consult your health care provider.  Hepatitis B vaccine.** / Consult your health care provider.  Haemophilus influenzae type b (Hib) vaccine.** / Consult your health care provider. Ages 67 to 39 years  Blood pressure check.** / Every year.  Lipid and cholesterol check.** / Every 5 years beginning at age 1 years.  Lung cancer screening. / Every year if you are aged 11-80 years and have a 30-pack-year history of smoking and currently smoke or have quit within the past 15 years. Yearly screening is stopped once you have quit smoking for at least 15 years or develop a health problem that would prevent you from having lung cancer treatment.  Clinical breast exam.** / Every year after age 68 years.  BRCA-related cancer risk assessment.** / For women who have family members with a BRCA-related cancer (breast, ovarian, tubal, or peritoneal cancers).  Mammogram.** / Every year beginning at age 69 years and continuing for as long as you are in good health. Consult with your health care provider.  Pap test.** / Every 3 years starting at age 79 years through age 4 or 56 years with a history of 3 consecutive normal Pap tests.  HPV screening.** / Every 3 years from ages 25 years through ages 70 to 23 years with a history of 3 consecutive normal Pap tests.  Fecal occult blood test (FOBT) of stool. / Every year beginning at age 40 years and continuing until age 75 years. You may not need to do this test if you get  a colonoscopy every 10 years.  Flexible sigmoidoscopy or colonoscopy.** / Every 5 years for a flexible sigmoidoscopy or every 10 years for a colonoscopy beginning at age 25 years and continuing until age 55 years.  Hepatitis C blood test.** / For all people born from [birth year] through 1965 and any individual with known risks for hepatitis C.  Skin self-exam. / Monthly.  Influenza vaccine. / Every year.  Tetanus, diphtheria, and acellular pertussis (Tdap/Td) vaccine.** / Consult your health care provider. Pregnant women should receive 1 dose of Tdap vaccine during each pregnancy. 1 dose of Td every 10 years.  Varicella vaccine.** / Consult your health care provider. Pregnant females who do not have evidence of immunity should receive the first dose after pregnancy.  Zoster vaccine.** / 1 dose for adults aged 20 years or older.  Measles, mumps, rubella (MMR) vaccine.** / You need at least 1 dose of MMR if you were born in 1957 or later. You may also need a second dose. For females of childbearing age, rubella immunity should be determined. If there is no evidence of immunity, females who are not pregnant should be vaccinated. If there is no evidence of immunity, females who are pregnant should delay immunization until after pregnancy.  Pneumococcal 13-valent conjugate (PCV13) vaccine.** / Consult your health care provider.  Pneumococcal polysaccharide (PPSV23) vaccine.** / 1 to 2 doses if you smoke cigarettes or if you have certain conditions.  Meningococcal vaccine.** /  Consult your health care provider.  Hepatitis A vaccine.** / Consult your health care provider.  Hepatitis B vaccine.** / Consult your health care provider.  Haemophilus influenzae type b (Hib) vaccine.** / Consult your health care provider. Ages 1 years and over  Blood pressure check.** / Every year.  Lipid and cholesterol check.** / Every 5 years beginning at age 27 years.  Lung cancer screening. / Every year if you  are aged 59-80 years and have a 30-pack-year history of smoking and currently smoke or have quit within the past 15 years. Yearly screening is stopped once you have quit smoking for at least 15 years or develop a health problem that would prevent you from having lung cancer treatment.  Clinical breast exam.** / Every year after age 37 years.  BRCA-related cancer risk assessment.** / For women who have family members with a BRCA-related cancer (breast, ovarian, tubal, or peritoneal cancers).  Mammogram.** / Every year beginning at age 75 years and continuing for as long as you are in good health. Consult with your health care provider.  Pap test.** / Every 3 years starting at age 84 years through age 18 or 74 years with 3 consecutive normal Pap tests. Testing can be stopped between 65 and 70 years with 3 consecutive normal Pap tests and no abnormal Pap or HPV tests in the past 10 years.  HPV screening.** / Every 3 years from ages 104 years through ages 41 or 26 years with a history of 3 consecutive normal Pap tests. Testing can be stopped between 65 and 70 years with 3 consecutive normal Pap tests and no abnormal Pap or HPV tests in the past 10 years.  Fecal occult blood test (FOBT) of stool. / Every year beginning at age 54 years and continuing until age 10 years. You may not need to do this test if you get a colonoscopy every 10 years.  Flexible sigmoidoscopy or colonoscopy.** / Every 5 years for a flexible sigmoidoscopy or every 10 years for a colonoscopy beginning at age 51 years and continuing until age 39 years.  Hepatitis C blood test.** / For all people born from [birth year] through 1964 and any individual with known risks for hepatitis C.  Osteoporosis screening.** / A one-time screening for women ages 68 years and over and women at risk for fractures or osteoporosis.  Skin self-exam. / Monthly.  Influenza vaccine. / Every year.  Tetanus, diphtheria, and acellular pertussis (Tdap/Td)  vaccine.** / 1 dose of Td every 10 years.  Varicella vaccine.** / Consult your health care provider.  Zoster vaccine.** / 1 dose for adults aged 26 years or older.  Pneumococcal 13-valent conjugate (PCV13) vaccine.** / Consult your health care provider.  Pneumococcal polysaccharide (PPSV23) vaccine.** / 1 dose for all adults aged 55 years and older.  Meningococcal vaccine.** / Consult your health care provider.  Hepatitis A vaccine.** / Consult your health care provider.  Hepatitis B vaccine.** / Consult your health care provider.  Haemophilus influenzae type b (Hib) vaccine.** / Consult your health care provider. ** Family history and personal history of risk and conditions may change your health care provider's recommendations.   This information is not intended to replace advice given to you by your health care provider. Make sure you discuss any questions you have with your health care provider.   Document Released: 07/04/2001 Document Revised: 05/29/2014 Document Reviewed: 10/03/2010 Elsevier Interactive Patient Education 2016 Elsevier Inc. Abdominal Hysterectomy Abdominal hysterectomy is a surgical procedure to remove your womb (  uterus). Your uterus is the muscular organ that contains a developing baby. This surgery is done for many reasons. You may need an abdominal hysterectomy if you have cancer, growths (tumors), long-term pain, or bleeding. You may also have this procedure if your uterus has slipped down into your vagina (uterine prolapse). Depending on why you need an abdominal hysterectomy, you may also have other reproductive organs removed. These could include the part of your vagina that connects with your uterus (cervix), the organs that make eggs (ovaries), and the tubes that connect the ovaries to the uterus (fallopian tubes). LET [facility] CARE PROVIDER KNOW ABOUT:   Any allergies you have.  All medicines you are taking, including vitamins, herbs, eye drops,  creams, and over-the-counter medicines.  Previous problems you or members of your family have had with the use of anesthetics.  Any blood disorders you have.  Previous surgeries you have had.  Medical conditions you have. RISKS AND COMPLICATIONS Generally, this is a safe procedure. However, as with any procedure, problems can occur. Infection is the most common problem after an abdominal hysterectomy. Other possible problems include:  Bleeding.  Formation of blood clots that may break free and travel to your lungs.  Injury to other organs near your uterus.  Nerve injury causing nerve pain.  Decreased interest in sex or pain during sexual intercourse. BEFORE THE PROCEDURE  Abdominal hysterectomy is a major surgical procedure. It can affect the way you feel about yourself. Talk to your health care provider about the physical and emotional changes hysterectomy may cause.  You may need to have blood work and X-rays done before surgery.  Quit smoking if you smoke. Ask your health care provider for help if you are struggling to quit.  Stop taking medicines that thin your blood as directed by your health care provider.  You may be instructed to take antibiotic medicines or laxatives before surgery.  Do not eat or drink anything for 6-8 hours before surgery.  Take your regular medicines with a small sip of water.  Bathe or shower the night or morning before surgery. PROCEDURE  Abdominal hysterectomy is done in the operating room at the hospital.  In most cases, you will be given a medicine that makes you go to sleep (general anesthetic).  The surgeon will make a cut (incision) through the skin in your lower belly.  The incision may be about 5-7 inches long. It may go side-to-side or up-and-down.  The surgeon will move aside the body tissue that covers your uterus. The surgeon will then carefully take out your uterus along with any of your other reproductive organs that need to  be removed.  Bleeding will be controlled with clamps or sutures.  The surgeon will close your incision with sutures or metal clips. AFTER THE PROCEDURE  You will have some pain immediately after the procedure.  You will be given pain medicine in the recovery room.  You will be taken to your hospital room when you have recovered from the anesthesia.  You may need to stay in the hospital for 2-5 days.  You will be given instructions for recovery at home.   This information is not intended to replace advice given to you by your health care provider. Make sure you discuss any questions you have with your health care provider.   Document Released: 05/13/2013 Document Reviewed: 05/13/2013 Elsevier Interactive Patient Education [employer].

## 2015-12-28 NOTE — Progress Notes (Signed)
Subjective:     Natalie Duffy is a 35 y.o. female and is here for a comprehensive physical exam. The patient reports problems - vaginal bleeding. She has been on OC's since her BTL due to bleeding. Bleeding continues to be an issue. Last bled x 2 wks with significant pain. Pain is left sided. She is still smoking and will be 36 next year.Last seen in ED within the month for the above. Mildly anemic with Hgb of 11 and normal pelvic sono. Having migraine headaches. Needs refill of meds. Desires hysterectomy.  Social History   Social History  . Marital status: Single    Spouse name: N/A  . Number of children: N/A  . Years of education: N/A   Occupational History  . Not on file.   Social History Main Topics  . Smoking status: Former Smoker    Packs/day: 0.25    Years: 15.00    Types: Cigarettes    Quit date: 07/21/2014  . Smokeless tobacco: Never Used  . Alcohol use No     Comment: seldom  . Drug use: No  . Sexual activity: Yes    Partners: Male    Birth control/ protection: Surgical   Other Topics Concern  . Not on file   Social History Narrative  . No narrative on file   Health Maintenance  Topic Date Due  . INFLUENZA VACCINE  12/21/2015  . PAP SMEAR  09/03/2016  . TETANUS/TDAP  06/03/2022  . HIV Screening  Completed    The following portions of the patient's history were reviewed and updated as appropriate: allergies, current medications, past family history, past medical history, past social history, past surgical history and problem list.  Review of Systems Pertinent items are noted in HPI.   Objective:    BP 119/79 (BP Location: Left Arm, Patient Position: Sitting, Cuff Size: Small)   Pulse 61   Resp 18   Ht 5' 3"  (1.6 m)   Wt 119 lb (54 kg)   LMP 12/18/2015   BMI 21.08 kg/m  General appearance: alert, cooperative and appears stated age Head: Normocephalic, without obvious abnormality, atraumatic Neck: no adenopathy, supple, symmetrical, trachea  midline and thyroid not enlarged, symmetric, no tenderness/mass/nodules Lungs: clear to auscultation bilaterally Breasts: normal appearance, no masses or tenderness Heart: regular rate and rhythm, S1, S2 normal, no murmur, click, rub or gallop Abdomen: soft, non-tender; bowel sounds normal; no masses,  no organomegaly Pelvic: cervix normal in appearance, external genitalia normal, no adnexal masses or tenderness, no cervical motion tenderness, uterus normal size, shape, and consistency and vagina normal without discharge Extremities: extremities normal, atraumatic, no cyanosis or edema Pulses: 2+ and symmetric Skin: Skin color, texture, turgor normal. No rashes or lesions Lymph nodes: Cervical, supraclavicular, and axillary nodes normal. Neurologic: Grossly normal    Assessment:    Healthy female exam.      Plan:  1. Screening for malignant neoplasm of cervix  - Cytology - PAP  2. Visit for routine gyn exam   3. Migraine without aura and without status migrainosus, not intractable F/u with headache specialist ASAP - rizatriptan (MAXALT) 10 MG tablet; Take 1 tablet (10 mg total) by mouth as needed for migraine. May repeat in 2 hours if needed  Dispense: 12 tablet; Refill: 11  4. Anxiety - doxepin (SINEQUAN) capsule 100 mg; Take 10 capsules (100 mg total) by mouth at bedtime.  5. Family history of ovarian cancer BRCA testing - Genetic Screening  6. Anovulatory bleeding Hysterectomy discussed  at length, including alternative treatment with Aygestin or IUD, or OC's, skip placebos given pain is only cyclical and will resolve with menopause and risks of Hysterectomy. Discussed endometrial ablation, risks of failure. Risks include but are not limited to bleeding, infection, injury to surrounding structures, including bowel, bladder and ureters, blood clots, and death. After careful consideration, she would like to proceed with TVH. previous C-section x 1 discussed. Likelihood of success  is high.    Return in 1 year (on 12/27/2016).    See After Visit Summary for Counseling Recommendations

## 2015-12-28 NOTE — Addendum Note (Signed)
Addended by: Gretchen Short on: 12/28/2015 05:12 PM   Modules accepted: Orders

## 2015-12-29 LAB — CYTOLOGY - PAP

## 2015-12-30 ENCOUNTER — Encounter (HOSPITAL_COMMUNITY): Payer: Self-pay | Admitting: *Deleted

## 2016-01-03 ENCOUNTER — Encounter (HOSPITAL_COMMUNITY)
Admission: RE | Admit: 2016-01-03 | Discharge: 2016-01-03 | Disposition: A | Discharge status: Home / Self Care | Payer: Medicaid Other | Source: Ambulatory Visit | Attending: Family Medicine | Admitting: Family Medicine

## 2016-01-03 ENCOUNTER — Encounter (HOSPITAL_COMMUNITY): Payer: Self-pay

## 2016-01-03 DIAGNOSIS — Z01818 Encounter for other preprocedural examination: Secondary | ICD-10-CM | POA: Insufficient documentation

## 2016-01-03 HISTORY — DX: Anemia, unspecified: D64.9

## 2016-01-03 HISTORY — DX: Personal history of other medical treatment: Z92.89

## 2016-01-03 LAB — CBC
HEMATOCRIT: 37 % (ref 36.0–46.0)
HEMOGLOBIN: 12.3 g/dL (ref 12.0–15.0)
MCH: 26.3 pg (ref 26.0–34.0)
MCHC: 33.2 g/dL (ref 30.0–36.0)
MCV: 79.1 fL (ref 78.0–100.0)
Platelets: 186 10*3/uL (ref 150–400)
RBC: 4.68 MIL/uL (ref 3.87–5.11)
RDW: 13.3 % (ref 11.5–15.5)
WBC: 5.2 10*3/uL (ref 4.0–10.5)

## 2016-01-03 NOTE — Patient Instructions (Addendum)
Your procedure is scheduled on: Tuesday, 8/15  Enter through the Main Entrance of [facility] at: 11:15 am  Pick up the phone at the desk and dial [phone].  Call this number if you have problems the morning of surgery: [phone].  Remember: Do NOT eat food after midnight tonight 8/14  Do NOT drink clear liquids after: 8am 8/15  Take these medicines the morning of surgery with a SIP OF WATER:  None  Do NOT wear jewelry (body piercing), metal hair clips/bobby pins, make-up, or nail polish.  Do NOT wear lotions, powders, or perfumes.  You may wear deoderant.  Do NOT shave for 48 hours prior to surgery.  Do NOT bring valuables to the hospital.  Contacts may not be worn into surgery.  Leave suitcase in car.  After surgery it may be brought to your room.  For patients admitted to the hospital, checkout time is 11:00 AM the day of discharge. Home with fiance Natalie Duffy cell [phone].

## 2016-01-03 NOTE — Anesthesia Preprocedure Evaluation (Addendum)
Anesthesia Evaluation  Patient identified by MRN, date of birth, ID band Patient awake    Reviewed: Allergy & Precautions, NPO status , Patient's Chart, lab work & pertinent test results  History of Anesthesia Complications Negative for: history of anesthetic complications  Airway Mallampati: I  TM Distance: >3 FB Neck ROM: Full    Dental  (+) Dental Advisory Given, Teeth Intact   Pulmonary neg shortness of breath, neg sleep apnea, neg COPD, neg recent URI, former smoker,    Pulmonary exam normal breath sounds clear to auscultation       Cardiovascular Exercise Tolerance: Good negative cardio ROS   Rhythm:Regular Rate:Normal     Neuro/Psych  Headaches, PSYCHIATRIC DISORDERS Anxiety    GI/Hepatic negative GI ROS, Neg liver ROS,   Endo/Other  negative endocrine ROS  Renal/GU Renal disease (h/o nephrolithiasis)     Musculoskeletal   Abdominal (+) - obese,   Peds  Hematology  (+) Blood dyscrasia, anemia ,   Anesthesia Other Findings H/o cervical cancer 2004, insomnia  Reproductive/Obstetrics                            Anesthesia Physical Anesthesia Plan  ASA: II  Anesthesia Plan: General   Post-op Pain Management:    Induction: Intravenous  Airway Management Planned: Oral ETT  Additional Equipment:   Intra-op Plan:   Post-operative Plan: Extubation in OR  Informed Consent: I have reviewed the patients History and Physical, chart, labs and discussed the procedure including the risks, benefits and alternatives for the proposed anesthesia with the patient or authorized representative who has indicated his/her understanding and acceptance.   Dental advisory given  Plan Discussed with:   Anesthesia Plan Comments:        Anesthesia Quick Evaluation

## 2016-01-03 NOTE — H&P (Signed)
Natalie Duffy is an 35 y.o. [phone] female.   Chief Complaint: abnormal uterine bleeding. HPI: The patient reports problems - vaginal bleeding. She has been on OC's since her BTL due to bleeding. Bleeding continues to be an issue. Last bled x 2 wks with significant pain. Pain is left sided. She is still smoking and will be 36 next year.Last seen in ED within the month for the above. Mildly anemic with Hgb of 11 and normal pelvic sono. Desires hysterectomy.  Past Medical History:  Diagnosis Date  . Abnormal Pap smear 2004  . Anemia   . Anxiety   . Cancer [facility]) 2004   cervical  . History of blood transfusion 2007, 2008   Last transfusion 2008 at [facility] 1 unit   . Insomnia   . Interstitial cystitis   . Kidney stones   . Migraine   . Pyelonephritis   . SVD (spontaneous vaginal delivery)    x 3  . Urinary tract infection     Past Surgical History:  Procedure Laterality Date  . BREAST SURGERY     augmentation  . CESAREAN SECTION  2005  . cone Bx  2004  . CYSTOSCOPY  2007  . DILATION AND CURETTAGE OF UTERUS    . HYSTEROSCOPY  2007  . LEEP  2004  . TUBAL LIGATION Bilateral 08/14/2012   Procedure: POST PARTUM TUBAL LIGATION;  Surgeon: Woodroe Mode, MD;  Location: [facility];  Service: Gynecology;  Laterality: Bilateral;  . WISDOM TOOTH EXTRACTION      Family History  Problem Relation Age of Onset  . Hypertension Father   . Cancer Maternal Grandmother     ovarian/lung/brain  . Cancer Paternal Grandfather     lung/prostate cancer   Social History:  reports that she quit smoking about 17 months ago. Her smoking use included Cigarettes. She has a 3.75 pack-year smoking history. She uses smokeless tobacco. She reports that she does not drink alcohol or use drugs.  Allergies:  Allergies  Allergen Reactions  . Vancomycin Anaphylaxis  . Procardia [Nifedipine] Swelling  . Z-Pak [Azithromycin] Other (See Comments)    Reaction unknown; a childhood allergy.   . Amoxicillin Rash  .  Clindamycin/Lincomycin Rash  . Penicillins Rash    Has patient had a PCN reaction causing immediate rash, facial/tongue/throat swelling, SOB or lightheadedness with hypotension: Yes Has patient had a PCN reaction causing severe rash involving mucus membranes or skin necrosis: No Has patient had a PCN reaction that required hospitalization No Has patient had a PCN reaction occurring within the last 10 years: No If all of the above answers are "NO", then may proceed with Cephalosporin use.   . Sulfa Antibiotics Rash    No current facility-administered medications on file prior to encounter.    Current Outpatient Prescriptions on File Prior to Encounter  Medication Sig Dispense Refill  . doxepin (SINEQUAN) 100 MG capsule Take 1 capsule (100 mg total) by mouth at bedtime. 30 capsule 3  . naproxen (NAPROSYN) 500 MG tablet Take 1 tablet (500 mg total) by mouth 2 (two) times daily. (Patient not taking: Reported on 12/31/2015) 20 tablet 0  . ondansetron (ZOFRAN ODT) 4 MG disintegrating tablet Take 1 tablet (4 mg total) by mouth every 8 (eight) hours as needed for nausea or vomiting. (Patient not taking: Reported on 12/28/2015) 10 tablet 0  . promethazine (PHENERGAN) 25 MG tablet TAKE 1 TABLET BY MOUTH EVERY 6 HOURS AS NEEDED FOR NAUSEA OR VOMITING (Patient not taking: Reported on  12/28/2015) 10 tablet 0  . rizatriptan (MAXALT) 10 MG tablet Take 1 tablet (10 mg total) by mouth as needed for migraine. May repeat in 2 hours if needed (Patient taking differently: Take 10 mg by mouth at bedtime as needed for migraine. May repeat in 2 hours if needed) 12 tablet 11  . traMADol (ULTRAM) 50 MG tablet Take 1 tablet (50 mg total) by mouth every 6 (six) hours as needed. (Patient not taking: Reported on 12/28/2015) 10 tablet 0    Pertinent items are noted in HPI.  Last menstrual period 12/18/2015. General appearance: alert, cooperative and appears stated age Head: Normocephalic, without obvious abnormality,  atraumatic Neck: no JVD Lungs: normal effort Heart: regular rate and rhythm Abdomen: soft, non-tender; bowel sounds normal; no masses,  no organomegaly Extremities: extremities normal, atraumatic, no cyanosis or edema Skin: Skin color, texture, turgor normal. No rashes or lesions Neurologic: Grossly normal   Lab Results  Component Value Date   WBC 5.2 01/03/2016   HGB 12.3 01/03/2016   HCT 37.0 01/03/2016   MCV 79.1 01/03/2016   PLT 186 01/03/2016   Lab Results  Component Value Date   PREGTESTUR NEGATIVE 10/31/2015     Assessment/Plan Principal Problem:   Anovulatory bleeding  For TVH and salpingectomy Risks include but are not limited to bleeding, infection, injury to surrounding structures, including bowel, bladder and ureters, blood clots, and death.  Likelihood of success is high.    Ethelbert Thain S 01/03/2016, 1:55 PM

## 2016-01-03 NOTE — Pre-Procedure Instructions (Signed)
SDS BB History Log given to lab for patient's previous blood transfusion in 2008 at [facility].

## 2016-01-04 ENCOUNTER — Encounter (HOSPITAL_COMMUNITY)
Admission: RE | Disposition: A | Discharge status: Home / Self Care | Payer: Self-pay | Source: Ambulatory Visit | Attending: Family Medicine

## 2016-01-04 ENCOUNTER — Ambulatory Visit (HOSPITAL_COMMUNITY): Payer: Medicaid Other | Admitting: Anesthesiology

## 2016-01-04 ENCOUNTER — Ambulatory Visit (HOSPITAL_COMMUNITY)
Admission: RE | Admit: 2016-01-04 | Discharge: 2016-01-05 | Disposition: A | Discharge status: Home / Self Care | Payer: Medicaid Other | Source: Ambulatory Visit | Attending: Family Medicine | Admitting: Family Medicine

## 2016-01-04 ENCOUNTER — Encounter (HOSPITAL_COMMUNITY): Payer: Self-pay

## 2016-01-04 DIAGNOSIS — Z87891 Personal history of nicotine dependence: Secondary | ICD-10-CM | POA: Insufficient documentation

## 2016-01-04 DIAGNOSIS — Z90710 Acquired absence of both cervix and uterus: Secondary | ICD-10-CM | POA: Diagnosis present

## 2016-01-04 DIAGNOSIS — N939 Abnormal uterine and vaginal bleeding, unspecified: Secondary | ICD-10-CM | POA: Insufficient documentation

## 2016-01-04 DIAGNOSIS — N970 Female infertility associated with anovulation: Secondary | ICD-10-CM | POA: Diagnosis present

## 2016-01-04 DIAGNOSIS — Z880 Allergy status to penicillin: Secondary | ICD-10-CM | POA: Insufficient documentation

## 2016-01-04 DIAGNOSIS — N938 Other specified abnormal uterine and vaginal bleeding: Secondary | ICD-10-CM

## 2016-01-04 HISTORY — PX: VAGINAL HYSTERECTOMY: SHX2639

## 2016-01-04 LAB — TYPE AND SCREEN
ABO/RH(D): A POS
Antibody Screen: NEGATIVE

## 2016-01-04 SURGERY — HYSTERECTOMY, VAGINAL
Anesthesia: General | Site: Vagina

## 2016-01-04 MED ORDER — MIDAZOLAM HCL 2 MG/2ML IJ SOLN
INTRAMUSCULAR | Status: DC | PRN
  Administered 2016-01-04: 2 mg via INTRAVENOUS

## 2016-01-04 MED ORDER — NEOSTIGMINE METHYLSULFATE 10 MG/10ML IV SOLN
INTRAVENOUS | Status: AC
  Filled 2016-01-04: qty 1

## 2016-01-04 MED ORDER — LIDOCAINE HCL (CARDIAC) 20 MG/ML IV SOLN
INTRAVENOUS | Status: AC
  Filled 2016-01-04: qty 5

## 2016-01-04 MED ORDER — FENTANYL CITRATE (PF) 250 MCG/5ML IJ SOLN
INTRAMUSCULAR | Status: AC
  Filled 2016-01-04: qty 5

## 2016-01-04 MED ORDER — SCOPOLAMINE 1 MG/3DAYS TD PT72
1.0000 | MEDICATED_PATCH | Freq: Once | TRANSDERMAL | Status: DC
  Administered 2016-01-04: 1.5 mg via TRANSDERMAL

## 2016-01-04 MED ORDER — KETOROLAC TROMETHAMINE 30 MG/ML IJ SOLN: 30.0000 mg | Freq: Four times a day (QID) | INTRAMUSCULAR | Status: DC

## 2016-01-04 MED ORDER — NEOSTIGMINE METHYLSULFATE 10 MG/10ML IV SOLN
INTRAVENOUS | Status: DC | PRN
  Administered 2016-01-04: 4 mg via INTRAVENOUS

## 2016-01-04 MED ORDER — PROMETHAZINE HCL 25 MG/ML IJ SOLN
INTRAMUSCULAR | Status: AC
  Filled 2016-01-04: qty 1

## 2016-01-04 MED ORDER — LACTATED RINGERS IV SOLN
INTRAVENOUS | Status: DC
  Administered 2016-01-04: 15:00:00 via INTRAVENOUS
  Administered 2016-01-04: 125 mL/h via INTRAVENOUS
  Administered 2016-01-04: 13:00:00 via INTRAVENOUS

## 2016-01-04 MED ORDER — FENTANYL CITRATE (PF) 100 MCG/2ML IJ SOLN
INTRAMUSCULAR | Status: AC
  Filled 2016-01-04: qty 2

## 2016-01-04 MED ORDER — HYDROMORPHONE HCL 1 MG/ML IJ SOLN
0.5000 mg | Freq: Once | INTRAMUSCULAR | Status: AC
  Administered 2016-01-04: 1 mg via INTRAVENOUS
  Filled 2016-01-04: qty 1

## 2016-01-04 MED ORDER — MENTHOL 3 MG MT LOZG: 1.0000 | LOZENGE | OROMUCOSAL | Status: DC | PRN

## 2016-01-04 MED ORDER — PROMETHAZINE HCL 25 MG/ML IJ SOLN
6.2500 mg | INTRAMUSCULAR | Status: DC | PRN
  Administered 2016-01-04: 6.25 mg via INTRAVENOUS

## 2016-01-04 MED ORDER — MIDAZOLAM HCL 2 MG/2ML IJ SOLN
INTRAMUSCULAR | Status: AC
  Filled 2016-01-04: qty 2

## 2016-01-04 MED ORDER — KETOROLAC TROMETHAMINE 30 MG/ML IJ SOLN: 30.0000 mg | Freq: Once | INTRAMUSCULAR | Status: DC

## 2016-01-04 MED ORDER — GENTAMICIN SULFATE 40 MG/ML IJ SOLN
5.0000 mg/kg | INTRAVENOUS | Status: AC
  Administered 2016-01-04: 106.5 mg via INTRAVENOUS
  Filled 2016-01-04: qty 6.5

## 2016-01-04 MED ORDER — DEXTROSE IN LACTATED RINGERS 5 % IV SOLN
INTRAVENOUS | Status: DC
  Administered 2016-01-04: 20:00:00 via INTRAVENOUS

## 2016-01-04 MED ORDER — ESTRADIOL 0.1 MG/GM VA CREA
TOPICAL_CREAM | VAGINAL | Status: DC | PRN
  Administered 2016-01-04: 1 via VAGINAL

## 2016-01-04 MED ORDER — FENTANYL CITRATE (PF) 100 MCG/2ML IJ SOLN
INTRAMUSCULAR | Status: DC | PRN
  Administered 2016-01-04 (×2): 50 ug via INTRAVENOUS
  Administered 2016-01-04 (×2): 100 ug via INTRAVENOUS
  Administered 2016-01-04: 50 ug via INTRAVENOUS

## 2016-01-04 MED ORDER — KETOROLAC TROMETHAMINE 30 MG/ML IJ SOLN
30.0000 mg | Freq: Four times a day (QID) | INTRAMUSCULAR | Status: DC
  Administered 2016-01-04 – 2016-01-05 (×3): 30 mg via INTRAVENOUS
  Filled 2016-01-04 (×3): qty 1

## 2016-01-04 MED ORDER — MEPERIDINE HCL 25 MG/ML IJ SOLN
INTRAMUSCULAR | Status: AC
  Filled 2016-01-04: qty 1

## 2016-01-04 MED ORDER — PROPOFOL 10 MG/ML IV BOLUS
INTRAVENOUS | Status: DC | PRN
  Administered 2016-01-04: 100 mg via INTRAVENOUS
  Administered 2016-01-04: 200 mg via INTRAVENOUS

## 2016-01-04 MED ORDER — MEPERIDINE HCL 25 MG/ML IJ SOLN
6.2500 mg | INTRAMUSCULAR | Status: DC | PRN
  Administered 2016-01-04 (×2): 6.25 mg via INTRAVENOUS

## 2016-01-04 MED ORDER — ROCURONIUM BROMIDE 100 MG/10ML IV SOLN
INTRAVENOUS | Status: AC
  Filled 2016-01-04: qty 1

## 2016-01-04 MED ORDER — ROCURONIUM BROMIDE 100 MG/10ML IV SOLN
INTRAVENOUS | Status: DC | PRN
  Administered 2016-01-04: 30 mg via INTRAVENOUS
  Administered 2016-01-04: 20 mg via INTRAVENOUS

## 2016-01-04 MED ORDER — LIDOCAINE-EPINEPHRINE 1 %-1:100000 IJ SOLN
INTRAMUSCULAR | Status: AC
  Filled 2016-01-04: qty 1

## 2016-01-04 MED ORDER — ONDANSETRON HCL 4 MG/2ML IJ SOLN
4.0000 mg | Freq: Four times a day (QID) | INTRAMUSCULAR | Status: DC | PRN
  Administered 2016-01-04: 4 mg via INTRAVENOUS
  Filled 2016-01-04: qty 2

## 2016-01-04 MED ORDER — HYDROMORPHONE HCL 1 MG/ML IJ SOLN
INTRAMUSCULAR | Status: AC
  Filled 2016-01-04: qty 1

## 2016-01-04 MED ORDER — SCOPOLAMINE 1 MG/3DAYS TD PT72
MEDICATED_PATCH | TRANSDERMAL | Status: AC
  Administered 2016-01-04: 1.5 mg via TRANSDERMAL
  Filled 2016-01-04: qty 1

## 2016-01-04 MED ORDER — OXYCODONE-ACETAMINOPHEN 5-325 MG PO TABS
1.0000 | ORAL_TABLET | ORAL | Status: DC | PRN
  Administered 2016-01-04 (×2): 1 via ORAL
  Administered 2016-01-05 (×3): 2 via ORAL
  Filled 2016-01-04: qty 1
  Filled 2016-01-04: qty 2
  Filled 2016-01-04: qty 1
  Filled 2016-01-04 (×2): qty 2

## 2016-01-04 MED ORDER — DEXAMETHASONE SODIUM PHOSPHATE 10 MG/ML IJ SOLN
INTRAMUSCULAR | Status: AC
  Filled 2016-01-04: qty 1

## 2016-01-04 MED ORDER — FENTANYL CITRATE (PF) 100 MCG/2ML IJ SOLN
25.0000 ug | INTRAMUSCULAR | Status: DC | PRN
  Administered 2016-01-04 (×3): 50 ug via INTRAVENOUS

## 2016-01-04 MED ORDER — IBUPROFEN 600 MG PO TABS: 600.0000 mg | ORAL_TABLET | Freq: Four times a day (QID) | ORAL | Status: DC | PRN

## 2016-01-04 MED ORDER — GLYCOPYRROLATE 0.2 MG/ML IJ SOLN
INTRAMUSCULAR | Status: AC
  Filled 2016-01-04: qty 3

## 2016-01-04 MED ORDER — PHENYLEPHRINE HCL 10 MG/ML IJ SOLN
INTRAMUSCULAR | Status: DC | PRN
  Administered 2016-01-04 (×2): 80 ug via INTRAVENOUS

## 2016-01-04 MED ORDER — LIDOCAINE HCL (CARDIAC) 20 MG/ML IV SOLN
INTRAVENOUS | Status: DC | PRN
  Administered 2016-01-04: 80 mg via INTRAVENOUS

## 2016-01-04 MED ORDER — LACTATED RINGERS IV SOLN: INTRAVENOUS | Status: DC

## 2016-01-04 MED ORDER — ONDANSETRON HCL 4 MG/2ML IJ SOLN
INTRAMUSCULAR | Status: AC
  Filled 2016-01-04: qty 2

## 2016-01-04 MED ORDER — LIDOCAINE-EPINEPHRINE 1 %-1:100000 IJ SOLN
INTRAMUSCULAR | Status: DC | PRN
  Administered 2016-01-04: 20 mL

## 2016-01-04 MED ORDER — GLYCOPYRROLATE 0.2 MG/ML IJ SOLN
INTRAMUSCULAR | Status: DC | PRN
  Administered 2016-01-04: 0.6 mg via INTRAVENOUS

## 2016-01-04 MED ORDER — PROPOFOL 10 MG/ML IV BOLUS
INTRAVENOUS | Status: AC
  Filled 2016-01-04: qty 40

## 2016-01-04 MED ORDER — HYDROMORPHONE HCL 1 MG/ML IJ SOLN
1.0000 mg | Freq: Once | INTRAMUSCULAR | Status: AC
  Administered 2016-01-04: 1 mg via INTRAVENOUS

## 2016-01-04 MED ORDER — METRONIDAZOLE IN NACL 5-0.79 MG/ML-% IV SOLN
500.0000 mg | INTRAVENOUS | Status: AC
  Administered 2016-01-04: 500 mg via INTRAVENOUS
  Filled 2016-01-04: qty 100

## 2016-01-04 MED ORDER — KETOROLAC TROMETHAMINE 30 MG/ML IJ SOLN
INTRAMUSCULAR | Status: DC | PRN
  Administered 2016-01-04: 30 mg via INTRAVENOUS

## 2016-01-04 MED ORDER — DOXEPIN HCL 25 MG PO CAPS
100.0000 mg | ORAL_CAPSULE | Freq: Every day | ORAL | Status: DC
  Filled 2016-01-04 (×2): qty 4

## 2016-01-04 MED ORDER — ESTRADIOL 0.1 MG/GM VA CREA
TOPICAL_CREAM | VAGINAL | Status: AC
  Filled 2016-01-04: qty 42.5

## 2016-01-04 SURGICAL SUPPLY — 27 items
CANISTER SUCT 3000ML (MISCELLANEOUS) ×3 IMPLANT
CLOTH BEACON ORANGE TIMEOUT ST (SAFETY) ×3 IMPLANT
CONT PATH 16OZ SNAP LID 3702 (MISCELLANEOUS) IMPLANT
DECANTER SPIKE VIAL GLASS SM (MISCELLANEOUS) ×2 IMPLANT
GAUZE PACKING 1 X5 YD ST (GAUZE/BANDAGES/DRESSINGS) ×2 IMPLANT
GAUZE PACKING 2X5 YD STRL (GAUZE/BANDAGES/DRESSINGS) IMPLANT
GLOVE BIOGEL PI IND STRL 6.5 (GLOVE) ×1 IMPLANT
GLOVE BIOGEL PI IND STRL 7.0 (GLOVE) ×2 IMPLANT
GLOVE BIOGEL PI INDICATOR 6.5 (GLOVE) ×2
GLOVE BIOGEL PI INDICATOR 7.0 (GLOVE) ×4
GLOVE ECLIPSE 7.0 STRL STRAW (GLOVE) ×6 IMPLANT
GOWN STRL REUS W/TWL LRG LVL3 (GOWN DISPOSABLE) ×15 IMPLANT
NDL SPNL 18GX3.5 QUINCKE PK (NEEDLE) ×1 IMPLANT
NEEDLE HYPO 22GX1.5 SAFETY (NEEDLE) IMPLANT
NEEDLE SPNL 18GX3.5 QUINCKE PK (NEEDLE) ×3 IMPLANT
NS IRRIG 1000ML POUR BTL (IV SOLUTION) ×3 IMPLANT
PACK VAGINAL WOMENS (CUSTOM PROCEDURE TRAY) ×3 IMPLANT
PAD OB MATERNITY 4.3X12.25 (PERSONAL CARE ITEMS) ×3 IMPLANT
SUT VIC AB 0 CT1 18XCR BRD8 (SUTURE) ×3 IMPLANT
SUT VIC AB 0 CT1 27 (SUTURE) ×6
SUT VIC AB 0 CT1 27XBRD ANBCTR (SUTURE) ×2 IMPLANT
SUT VIC AB 0 CT1 8-18 (SUTURE) ×6
SUT VICRYL 0 TIES 12 18 (SUTURE) ×3 IMPLANT
SYR 20CC LL (SYRINGE) ×3 IMPLANT
TOWEL OR 17X24 6PK STRL BLUE (TOWEL DISPOSABLE) ×6 IMPLANT
TRAY FOLEY CATH SILVER 14FR (SET/KITS/TRAYS/PACK) ×3 IMPLANT
WATER STERILE IRR 1000ML POUR (IV SOLUTION) ×3 IMPLANT

## 2016-01-04 NOTE — Transfer of Care (Signed)
Immediate Anesthesia Transfer of Care Note  Patient: Natalie Duffy  Procedure(s) Performed: Procedure(s): HYSTERECTOMY VAGINAL (N/A)  Patient Location: PACU  Anesthesia Type:General  Level of Consciousness: awake and alert   Airway & Oxygen Therapy: Patient Spontanous Breathing and Patient connected to nasal cannula oxygen  Post-op Assessment: Report given to RN and Post -op Vital signs reviewed and stable  Post vital signs: Reviewed and stable  Last Vitals:  Vitals:   01/04/16 1122 01/04/16 1415  BP: 111/79   Pulse: 78   Resp: 18   Temp: 36.9 C 36.7 C    Last Pain:  Vitals:   01/04/16 1122  TempSrc: Oral      Patients Stated Pain Goal: 3 ([id number])  Complications: No apparent anesthesia complications

## 2016-01-04 NOTE — Anesthesia Procedure Notes (Signed)
Procedure Name: Intubation Performed by: Nilda Simmer Pre-anesthesia Checklist: Patient identified, Emergency Drugs available, Suction available and Patient being monitored Patient Re-evaluated:Patient Re-evaluated prior to inductionOxygen Delivery Method: Circle system utilized Preoxygenation: Pre-oxygenation with 100% oxygen Intubation Type: IV induction Ventilation: Mask ventilation without difficulty and Oral airway inserted - appropriate to patient size Laryngoscope Size: Mac and 3 Tube type: Oral Number of attempts: 3 Airway Equipment and Method: Stylet,  Oral airway and Video-laryngoscopy Placement Confirmation: ETT inserted through vocal cords under direct vision,  positive ETCO2 and breath sounds checked- equal and bilateral Secured at: 21 cm Tube secured with: Tape Difficulty Due To: Difficult Airway- due to anterior larynx Comments: DL with MAC 3 x1 by CRNA. DL with MAC 3 x1 by Dr. Cheral Bay with grade IIb view. Unable to pass bougie. Glidescope 3 with grade I view x1 attempt.

## 2016-01-04 NOTE — Progress Notes (Signed)
Ms. Natalie Duffy developed a red rash on her face and neck, denies itching or shortness of breath.  Removed Scop patch behind ear.  Dr. Neoma Laming informed and assessed patient at bedside. No new orders received at this time.  Will continue to monitor.

## 2016-01-04 NOTE — Progress Notes (Signed)
Paged MD twice. Called MD on call to get a one time dose of IV 0.5 Dilaudid.

## 2016-01-04 NOTE — Addendum Note (Signed)
Addendum  created 01/04/16 1744 by Raenette Rover, CRNA   Sign clinical note

## 2016-01-04 NOTE — Interval H&P Note (Signed)
History and Physical Interval Note:  01/04/2016 12:27 PM  Natalie Duffy  has presented today for surgery, with the diagnosis of ABNORMAL UTRINE BLEEDING WITH LEFT SIDED PAIN  The various methods of treatment have been discussed with the patient and family. After consideration of risks, benefits and other options for treatment, the patient has consented to  Procedure(s): HYSTERECTOMY VAGINAL (N/A) as a surgical intervention .  The patient's history has been reviewed, patient examined, no change in status, stable for surgery.  I have reviewed the patient's chart and labs.  Questions were answered to the patient's satisfaction.     [place]

## 2016-01-04 NOTE — Anesthesia Postprocedure Evaluation (Signed)
Anesthesia Post Note  Patient: Natalie Duffy  Procedure(s) Performed: Procedure(s) (LRB): HYSTERECTOMY VAGINAL (N/A)  Patient location during evaluation: PACU Anesthesia Type: General Level of consciousness: awake Pain management: pain level controlled Vital Signs Assessment: post-procedure vital signs reviewed and stable Respiratory status: spontaneous breathing Cardiovascular status: stable Postop Assessment: no signs of nausea or vomiting Anesthetic complications: no     Last Vitals:  Vitals:   01/04/16 1545 01/04/16 1600  BP: (!) 93/59 (!) 100/55  Pulse: 77 70  Resp: (!) 22 10  Temp:      Last Pain:  Vitals:   01/04/16 1545  TempSrc:   PainSc: 5    Pain Goal: Patients Stated Pain Goal: 3 (01/04/16 1545)               Odon

## 2016-01-04 NOTE — Op Note (Signed)
Preoperative diagnosis: Abnormal uterine bleeding  Postoperative diagnosis: Same  Procedure: Transvaginal hysterectomy, Bilateral Salpingectomy   Surgeon: Standley Dakins. Kennon Rounds, M.D.  Assistant: Arlina Robes, MD  Anesthesia: General ETT-,Jennifer Effie Shy, MD  Findings: Normal appearing uterus, tubes, ovaries.  Estimated blood loss: 200 cc  Specimen: Uterus to pathology  Reason for procedure: Patient had long h/o bleeding since her tubal. She has been on OC's but is a smoker and age 35 and had had previous disappointing results with IUD, depo.The patient desired definitive treatment.  Risks of  hysterectomy reviewed.  Risks include but are not limited to bleeding, infection, injury to surrounding structures, including bowel, bladder and ureters, blood clots, and death. Likelihood of success of surgery is high.  Procedure: Patient was taken to the OR where she was placed in dorsal lithotomy in [place]. She was prepped and draped in the usual sterile fashion. A timeout was performed. The patient received Gent + flagyl prior to procedure. The patient had SCDs in place.  A speculum was placed inside the vagina. The cervix was visualized and grasped with 2 doublle-tooth tenacula. 20 cc of 1% lidocaine with epinephrine were injected paracervically. A knife was used to make a circumferential incision around the vagina. An opened sponge was used to dissect the vagina off the cervix. The posterior peritoneum was entered sharply with this incision. The posterior peritoneum was tagged to the vaginal cuff with a single stitch. The anterior peritoneal cavity was entered sharply with careful dissection of the bladder off the underlying cervix. A Heaney clamp was used to clamp first the left uterosacral ligament and cardinal which was then cut and Haney suture ligated with 0 Vicryl stitch, the stitch was held. Similarly the right uterosacral ligament was clamped cut and suture ligated. Sequential bites  up the broad to the uterine arteries were taken until the tubo-ovarian pedicles were encountered. The  left utero-ovarian pedicle grasped with a Heaney clamp. The right utero-ovarian pedicle was similarly grasped with the Heaney clamp. The right and left tubes were easily visualized. The left  tube was grasped with Babcock clamp and a Tonsil clamp placed behind this. Tube removed suture ligated. The right tube was similarly grasped with a Babcock clamp and a Tonsil clamp used to clamp behind this. The tube was removed and suture ligated. Inspection of all pedicles revealed adequate hemostasis. There was some bleeding noted at the vaginal cuff on the right side. The vagina was closed with 0 Vicryl suture in a locked running fashion with care taken to incorporate the uterosacral pedicles. Excellent hemostasis was noted at the end of the case. The vaginal cuff was inspected there was minimal bleeding noted.  A Foley catheter is placed inside her bladder. Clear, yellow urine was noted. All instrument needle and lap counts were correct x 2. Patient was awakened taken to recovery room in stable condition.  Donnamae Jude, MD 01/04/2016, 2:02 PM

## 2016-01-04 NOTE — Anesthesia Postprocedure Evaluation (Addendum)
Anesthesia Post Note  Patient: Natalie Duffy  Procedure(s) Performed: Procedure(s) (LRB): HYSTERECTOMY VAGINAL (N/A)  Patient location during evaluation: [facility] Anesthesia Type: General Level of consciousness: awake, awake and alert, oriented and patient cooperative Pain management: pain level not controlled (discussion with RN about patient's pain; RN aware and has already paged MD to get orders for IV pain meds.) Vital Signs Assessment: post-procedure vital signs reviewed and stable Respiratory status: spontaneous breathing, nonlabored ventilation and respiratory function stable Cardiovascular status: stable Postop Assessment: no headache, no backache, no signs of nausea or vomiting and patient able to bend at knees Anesthetic complications: no     Last Vitals:  Vitals:   01/04/16 1640 01/04/16 1737  BP: (!) 102/51 108/68  Pulse: 71 82  Resp: 12 16  Temp: 36.8 C 37.1 C    Last Pain:  Vitals:   01/04/16 1737  TempSrc: Oral  PainSc:    Pain Goal: Patients Stated Pain Goal: 3 (01/04/16 1640)   pain score: 9/10            Nyasha Rahilly L

## 2016-01-05 ENCOUNTER — Encounter (HOSPITAL_COMMUNITY): Payer: Self-pay | Admitting: Family Medicine

## 2016-01-05 DIAGNOSIS — N939 Abnormal uterine and vaginal bleeding, unspecified: Secondary | ICD-10-CM | POA: Diagnosis not present

## 2016-01-05 LAB — CBC
HCT: 23.4 % — ABNORMAL LOW (ref 36.0–46.0)
Hemoglobin: 8.3 g/dL — ABNORMAL LOW (ref 12.0–15.0)
MCH: 27.4 pg (ref 26.0–34.0)
MCHC: 35.5 g/dL (ref 30.0–36.0)
MCV: 77.2 fL — ABNORMAL LOW (ref 78.0–100.0)
PLATELETS: 158 10*3/uL (ref 150–400)
RBC: 3.03 MIL/uL — ABNORMAL LOW (ref 3.87–5.11)
RDW: 13.2 % (ref 11.5–15.5)
WBC: 8.2 10*3/uL (ref 4.0–10.5)

## 2016-01-05 MED ORDER — OXYCODONE-ACETAMINOPHEN 5-325 MG PO TABS: 1.0000 | ORAL_TABLET | ORAL | 0 refills | Status: DC | PRN

## 2016-01-05 MED ORDER — SIMETHICONE 80 MG PO CHEW
80.0000 mg | CHEWABLE_TABLET | Freq: Three times a day (TID) | ORAL | Status: DC
  Administered 2016-01-05: 80 mg via ORAL
  Filled 2016-01-05: qty 1

## 2016-01-05 NOTE — Progress Notes (Signed)
Vaginal packing removed as ordered by MD. Minimal drainage noted; serosanguineous to sanguineous.  Scant amount drainage on peri pad. Pt tolerated well.

## 2016-01-05 NOTE — Discharge Instructions (Signed)
Vaginal Hysterectomy, Care After Refer to this sheet in the next few weeks. These instructions provide you with information on caring for yourself after your procedure. Your health care provider may also give you more specific instructions. Your treatment has been planned according to current medical practices, but problems sometimes occur. Call your health care provider if you have any problems or questions after your procedure.  WHAT TO EXPECT AFTER THE PROCEDURE After your procedure, it is typical to have the following:  Pain.  Feeling tired.  Poor appetite.  Less interest in sex. It takes 4-6 weeks to recover from this surgery.  HOME CARE INSTRUCTIONS   Take pain medicines only as directed by your health care provider. Do not take over-the-counter pain medicines without checking with your health care provider first.  Take showers instead of baths for 2-3 weeks. Ask your health care provider when it is safe to start showering.  Do not douche, use tampons, or have sexual intercourse for at least 6 weeks or until your health care provider says you can.   Follow your health care provider's advice about exercise, lifting, driving, and general activities.  Get plenty of rest and sleep.   Do not lift anything heavier than a gallon of milk (about 10 lb [4.5 kg]) for the first month after surgery.  You can resume your normal diet if your health care provider says it is okay.   Do not drink alcohol until your health care provider says you can.   If you are constipated, ask your health care provider if you can take a mild laxative.  Eating foods high in fiber may also help with constipation. Eat plenty of raw fruits and vegetables, whole grains, and beans.  Drink enough fluids to keep your urine clear or pale yellow.   Try to have someone at home with you for the first 1-2 weeks to help around the house.  Keep all follow-up appointments. SEEK MEDICAL CARE IF:   You have  chills or fever.  You have swelling, redness, or pain in the area of your incision that is getting worse.   You have pus coming from the incision.   You notice a bad smell coming from the incision or bandage.   Your incision breaks open.   You feel dizzy or light-headed.   You have pain or bleeding when you urinate.   You have persistent diarrhea.   You have persistent nausea and vomiting.   You have abnormal vaginal discharge.   You have a rash.   You have any type of abnormal reaction or develop an allergy to your medicine.   Your pain medicine is not helping.  SEEK IMMEDIATE MEDICAL CARE IF:   You have a fever and your symptoms suddenly get worse.  You have severe abdominal pain.  You have chest pain.  You have shortness of breath.  You faint.  You have pain, swelling, or redness of your leg.  You have heavy vaginal bleeding with blood clots. MAKE SURE YOU:  Understand these instructions.  Will watch your condition.  Will get help right away if you are not doing well or get worse.   This information is not intended to replace advice given to you by your health care provider. Make sure you discuss any questions you have with your health care provider.   Document Released: 11/25/2004 Document Revised: 05/29/2014 Document Reviewed: 02/28/2013 Elsevier Interactive Patient Education [employer].

## 2016-01-05 NOTE — Discharge Summary (Signed)
Physician Discharge Summary  Patient ID: Natalie Duffy MRN: [id number] DOB/AGE: [birth year] 35 y.o.  Admit date: 01/04/2016 Discharge date:   Admission Diagnoses:  Principal Problem:   Anovulatory bleeding Active Problems:   Status post vaginal hysterectomy   Discharge Diagnoses:  Same  Past Medical History:  Diagnosis Date  . Abnormal Pap smear 2004  . Anemia   . Anxiety   . Cancer [facility]) 2004   cervical  . History of blood transfusion 2007, 2008   Last transfusion 2008 at [facility] unit   . Insomnia   . Interstitial cystitis   . Kidney stones   . Migraine   . Pyelonephritis   . SVD (spontaneous vaginal delivery)    x 3  . Urinary tract infection     Surgeries: Procedure(s): HYSTERECTOMY VAGINAL on 01/04/2016 Discharged Condition: Improved  Hospital Course: Natalie Duffy is an 35 y.o. female [id number] who was admitted 01/04/2016 with a chief complaint of abnormal bleeding, and found to have a diagnosis of Anovulatory bleeding.  They were brought to the operating room on 01/04/2016 and underwent the above named procedures.    They were given perioperative antibiotics:  Anti-infectives    Start     Dose/Rate Route Frequency Ordered Stop   01/04/16 0600  metroNIDAZOLE (FLAGYL) IVPB 500 mg     500 mg 100 mL/hr over 60 Minutes Intravenous On call to O.R. 01/04/16 0111 01/04/16 1232   01/04/16 0600  gentamicin (GARAMYCIN) 260 mg in dextrose 5 % 100 mL IVPB     5 mg/kg  52.8 kg 106.5 mL/hr over 60 Minutes Intravenous On call to O.R. 01/04/16 0111 01/04/16 1259    .  They were given sequential compression devices, early ambulation, and chemoprophylaxis for DVT prophylaxis. Vaginal packing and foley removed. Tolerating po, voiding and ambulating without difficulty.  They benefited maximally from their hospital stay and there were no complications.    Recent vital signs:  Vitals:   01/05/16 0120 01/05/16 0626  BP: 110/60 98/63  Pulse: 71 68  Resp: 18 16  Temp:  98.2 F (36.8 C) 98.1 F (36.7 C)   Discharge exam: Physical Examination: General appearance - alert, well appearing, and in no distress Chest - normal effort Abdomen - soft, flat, appropriately tender Extremities - no edema, redness or tenderness in the calves or thighs  Recent laboratory studies:  Results for orders placed or performed during the hospital encounter of 01/04/16  CBC  Result Value Ref Range   WBC 8.2 4.0 - 10.5 K/uL   RBC 3.03 (L) 3.87 - 5.11 MIL/uL   Hemoglobin 8.3 (L) 12.0 - 15.0 g/dL   HCT 23.4 (L) 36.0 - 46.0 %   MCV 77.2 (L) 78.0 - 100.0 fL   MCH 27.4 26.0 - 34.0 pg   MCHC 35.5 30.0 - 36.0 g/dL   RDW 13.2 11.5 - 15.5 %   Platelets 158 150 - 400 K/uL  Type and screen Natalie Duffy  Result Value Ref Range   ABO/RH(D) A POS    Antibody Screen NEG    Sample Expiration 01/07/2016     Discharge Medications:     Medication List    TAKE these medications   doxepin 100 MG capsule Commonly known as:  SINEQUAN Take 1 capsule (100 mg total) by mouth at bedtime.   naproxen 500 MG tablet Commonly known as:  NAPROSYN Take 1 tablet (500 mg total) by mouth 2 (two) times daily.   ondansetron 4 MG disintegrating tablet Commonly  known as:  ZOFRAN ODT Take 1 tablet (4 mg total) by mouth every 8 (eight) hours as needed for nausea or vomiting.   oxyCODONE-acetaminophen 5-325 MG tablet Commonly known as:  PERCOCET/ROXICET Take 1-2 tablets by mouth every 4 (four) hours as needed for severe pain (moderate to severe pain (when tolerating fluids)).   promethazine 25 MG tablet Commonly known as:  PHENERGAN TAKE 1 TABLET BY MOUTH EVERY 6 HOURS AS NEEDED FOR NAUSEA OR VOMITING   rizatriptan 10 MG tablet Commonly known as:  MAXALT Take 1 tablet (10 mg total) by mouth as needed for migraine. May repeat in 2 hours if needed What changed:  when to take this  additional instructions   traMADol 50 MG tablet Commonly known as:  ULTRAM Take 1 tablet  (50 mg total) by mouth every 6 (six) hours as needed.       Diagnostic Studies: No results found.  Disposition: 01-Home or Self Care  Discharge Instructions    Call MD for:    Complete by:  As directed   Heavy Vaginal bleeding   Call MD for:  persistant nausea and vomiting    Complete by:  As directed   Call MD for:  severe uncontrolled pain    Complete by:  As directed   Call MD for:  temperature >100.4    Complete by:  As directed   Diet - low sodium heart healthy    Complete by:  As directed   Increase activity slowly    Complete by:  As directed         Signed: Jaliel Duffy S 01/05/2016, 6:58 AM

## 2016-01-12 ENCOUNTER — Encounter: Payer: Self-pay | Admitting: Medical Oncology

## 2016-01-12 ENCOUNTER — Emergency Department
Admission: EM | Admit: 2016-01-12 | Discharge: 2016-01-12 | Disposition: A | Discharge status: Home / Self Care | Payer: Medicaid Other | Attending: Emergency Medicine | Admitting: Emergency Medicine

## 2016-01-12 ENCOUNTER — Emergency Department: Payer: Medicaid Other

## 2016-01-12 DIAGNOSIS — Z8541 Personal history of malignant neoplasm of cervix uteri: Secondary | ICD-10-CM | POA: Insufficient documentation

## 2016-01-12 DIAGNOSIS — F17290 Nicotine dependence, other tobacco product, uncomplicated: Secondary | ICD-10-CM | POA: Insufficient documentation

## 2016-01-12 DIAGNOSIS — R0781 Pleurodynia: Secondary | ICD-10-CM | POA: Diagnosis not present

## 2016-01-12 DIAGNOSIS — Z79899 Other long term (current) drug therapy: Secondary | ICD-10-CM | POA: Diagnosis not present

## 2016-01-12 DIAGNOSIS — Z791 Long term (current) use of non-steroidal anti-inflammatories (NSAID): Secondary | ICD-10-CM | POA: Diagnosis not present

## 2016-01-12 DIAGNOSIS — R0602 Shortness of breath: Secondary | ICD-10-CM | POA: Diagnosis present

## 2016-01-12 LAB — CBC WITH DIFFERENTIAL/PLATELET
BASOS PCT: 0 %
Basophils Absolute: 0 10*3/uL (ref 0–0.1)
EOS ABS: 0.1 10*3/uL (ref 0–0.7)
Eosinophils Relative: 1 %
HEMATOCRIT: 31.8 % — AB (ref 35.0–47.0)
HEMOGLOBIN: 10.7 g/dL — AB (ref 12.0–16.0)
LYMPHS ABS: 1.7 10*3/uL (ref 1.0–3.6)
Lymphocytes Relative: 23 %
MCH: 27.1 pg (ref 26.0–34.0)
MCHC: 33.6 g/dL (ref 32.0–36.0)
MCV: 80.8 fL (ref 80.0–100.0)
MONOS PCT: 6 %
Monocytes Absolute: 0.4 10*3/uL (ref 0.2–0.9)
Neutro Abs: 5.1 10*3/uL (ref 1.4–6.5)
Neutrophils Relative %: 70 %
Platelets: 227 10*3/uL (ref 150–440)
RBC: 3.93 MIL/uL (ref 3.80–5.20)
RDW: 14.4 % (ref 11.5–14.5)
WBC: 7.4 10*3/uL (ref 3.6–11.0)

## 2016-01-12 LAB — TROPONIN I: Troponin I: <0.03 ng/mL

## 2016-01-12 LAB — BRAIN NATRIURETIC PEPTIDE: B Natriuretic Peptide: 7 pg/mL (ref 0.0–100.0)

## 2016-01-12 LAB — BASIC METABOLIC PANEL
Anion gap: 6 (ref 5–15)
BUN: 15 mg/dL (ref 6–20)
CHLORIDE: 104 mmol/L (ref 101–111)
CO2: 26 mmol/L (ref 22–32)
CREATININE: 0.39 mg/dL — AB (ref 0.44–1.00)
Calcium: 8.9 mg/dL (ref 8.9–10.3)
GFR calc non Af Amer: >60 mL/min (ref >60)
Glucose, Bld: 93 mg/dL (ref 65–99)
POTASSIUM: 4.1 mmol/L (ref 3.5–5.1)
SODIUM: 136 mmol/L (ref 135–145)

## 2016-01-12 MED ORDER — MORPHINE SULFATE (PF) 4 MG/ML IV SOLN
4.0000 mg | Freq: Once | INTRAVENOUS | Status: AC
  Administered 2016-01-12: 4 mg via INTRAVENOUS
  Filled 2016-01-12: qty 1

## 2016-01-12 MED ORDER — IOPAMIDOL (ISOVUE-370) INJECTION 76%
75.0000 mL | Freq: Once | INTRAVENOUS | Status: AC | PRN
  Administered 2016-01-12: 75 mL via INTRAVENOUS

## 2016-01-12 MED ORDER — MORPHINE SULFATE (PF) 4 MG/ML IV SOLN
4.0000 mg | Freq: Once | INTRAVENOUS | Status: AC
Start: 2016-01-12 — End: 2016-01-12
  Administered 2016-01-12: 4 mg via INTRAVENOUS
  Filled 2016-01-12: qty 1

## 2016-01-12 NOTE — ED Notes (Signed)
Patient transported to X-ray 

## 2016-01-12 NOTE — ED Notes (Signed)
Pt reports she feels a pain"in her lung" when she takes a deep breath, reports it is sharp and goes up into her shoulder.

## 2016-01-12 NOTE — ED Triage Notes (Signed)
Pt reports last night she began having rt sided pain under her rib cage and when she takes a deep breath pain worsens. Pt reports she had a vaginal hysterectomy last Tuesday. Pt reports that she has not had a fever.

## 2016-01-12 NOTE — ED Notes (Signed)
Patient transported to CT 

## 2016-01-12 NOTE — ED Notes (Signed)
20 gauge iv removed from right ac with catheter intact. Pt was discharged with understandiing of instructions to use incentive spirometry.

## 2016-01-12 NOTE — ED Provider Notes (Signed)
[facility] Emergency Department Provider Note        Time seen: ----------------------------------------- 10:40 AM on 01/12/2016 -----------------------------------------    I have reviewed the triage vital signs and the nursing notes.   HISTORY  Chief Complaint Shortness of Breath and Chest Pain    HPI Natalie Duffy is a 35 y.o. female who presents the ER with right-sided pain in her rib cage which she takes deep breath. Patient reports she had a transvaginal hysterectomy last Tuesday. Patient states the lower abdominal pain that she had postoperatively had resolved. She reports she's not had a fever or chills, denies vomiting. Had taken a laxative and moved her bowels well. She has not had this pain before.   Past Medical History:  Diagnosis Date  . Abnormal Pap smear 2004  . Anemia   . Anxiety   . Cancer [facility]) 2004   cervical  . History of blood transfusion 2007, 2008   Last transfusion 2008 at [facility] unit   . Insomnia   . Interstitial cystitis   . Kidney stones   . Migraine   . Pyelonephritis   . SVD (spontaneous vaginal delivery)    x 3  . Urinary tract infection     Patient Active Problem List   Diagnosis Date Noted  . Status post vaginal hysterectomy 01/04/2016  . Left ovarian cyst 06/09/2014  . Anovulatory bleeding 02/09/2014  . Anxiety 09/16/2013  . History of conization of cervix 04/02/2012  . Migraine 01/31/2011  . PALPITATIONS 10/16/2008    Past Surgical History:  Procedure Laterality Date  . BREAST SURGERY     augmentation  . CESAREAN SECTION  2005  . cone Bx  2004  . CYSTOSCOPY  2007  . DILATION AND CURETTAGE OF UTERUS    . HYSTEROSCOPY  2007  . LEEP  2004  . TUBAL LIGATION Bilateral 08/14/2012   Procedure: POST PARTUM TUBAL LIGATION;  Surgeon: Woodroe Mode, MD;  Location: [facility];  Service: Gynecology;  Laterality: Bilateral;  . VAGINAL HYSTERECTOMY N/A 01/04/2016   Procedure: HYSTERECTOMY VAGINAL;  Surgeon:  Donnamae Jude, MD;  Location: [facility];  Service: Gynecology;  Laterality: N/A;  . WISDOM TOOTH EXTRACTION      Allergies Vancomycin; Procardia [nifedipine]; Z-pak [azithromycin]; Amoxicillin; Clindamycin/lincomycin; Erythromycin; Penicillins; and Sulfa antibiotics  Social History Social History  Substance Use Topics  . Smoking status: Former Smoker    Packs/day: 0.25    Years: 15.00    Types: Cigarettes    Quit date: 07/21/2014  . Smokeless tobacco: Current User     Comment: E-cigarettes daily  . Alcohol use No     Comment: seldom    Review of Systems Constitutional: Negative for fever. Cardiovascular: Positive for pain with breathing Respiratory: Negative for shortness of breath. Gastrointestinal: Negative for abdominal pain Genitourinary: Negative for dysuria. Musculoskeletal: Negative for back pain. Skin: Negative for rash. Neurological: Negative for headaches, focal weakness or numbness.  10-point ROS otherwise negative.  ____________________________________________   PHYSICAL EXAM:  VITAL SIGNS: ED Triage Vitals [01/12/16 0958]  Enc Vitals Group     BP 108/71     Pulse Rate 85     Resp 18     Temp 97.8 F (36.6 C)     Temp Source Oral     SpO2 98 %     Weight 116 lb (52.6 kg)     Height 5\' 3"  (1.6 m)     Head Circumference      Peak Flow  Pain Score 8     Pain Loc      Pain Edu?      Excl. in [place]?     Constitutional: Alert and oriented. Well appearing and in no distress. Eyes: Conjunctivae are normal. PERRL. Normal extraocular movements. ENT   Head: Normocephalic and atraumatic.   Nose: No congestion/rhinnorhea.   Mouth/Throat: Mucous membranes are moist.   Neck: No stridor. Cardiovascular: Normal rate, regular rhythm. No murmurs, rubs, or gallops. Respiratory: Normal respiratory effort without tachypnea nor retractions. Breath sounds are clear and equal bilaterally.Occasional splinting with deep breath. Gastrointestinal: Soft and  nontender. Normal bowel sounds Musculoskeletal: Nontender with normal range of motion in all extremities. No lower extremity tenderness nor edema. Neurologic:  Normal speech and language. No gross focal neurologic deficits are appreciated.  Skin:  Skin is warm, dry and intact. No rash noted. Psychiatric: Mood and affect are normal. Speech and behavior are normal.   EKG: Interpreted by me, normal sinus rhythm rate 88 bpm, normal PR interval, normal QRS, normal QT interval. Normal axis.  ____________________________________________  ED COURSE:  Pertinent labs & imaging results that were available during my care of the patient were reviewed by me and considered in my medical decision making (see chart for details). Clinical Course  Patient presents to ER with pleuritic pain concerning for PE. We will obtain basic labs and likely CT imaging.  Procedures ____________________________________________   LABS (pertinent positives/negatives)  Labs Reviewed  CBC WITH DIFFERENTIAL/PLATELET - Abnormal; Notable for the following:       Result Value   Hemoglobin 10.7 (*)    HCT 31.8 (*)    All other components within normal limits  BASIC METABOLIC PANEL - Abnormal; Notable for the following:    Creatinine, Ser 0.39 (*)    All other components within normal limits  BRAIN NATRIURETIC PEPTIDE  TROPONIN I    RADIOLOGY Images were viewed by me  CTA of the chest Is unremarkable, chest x-ray is unremarkable ____________________________________________  FINAL ASSESSMENT AND PLAN  Pleuritic pain  Plan: Patient with labs and imaging as dictated above. No clear etiology for her symptoms. CT angiogram of the chest is negative for PE. I will give her an incentive spirometer, she states she has pain medicine at home. She does not have abdominal pain to deep palpation. She is stable for outpatient follow-up with her doctor.   Earleen Newport, MD   Note: This dictation was prepared with  Dragon dictation. Any transcriptional errors that result from this process are unintentional    Earleen Newport, MD 01/12/16 1401

## 2016-01-21 ENCOUNTER — Institutional Professional Consult (permissible substitution): Payer: Medicaid Other | Admitting: Physician Assistant

## 2016-03-13 ENCOUNTER — Encounter: Payer: Self-pay | Admitting: *Deleted

## 2016-03-16 ENCOUNTER — Encounter: Payer: Self-pay | Admitting: *Deleted

## 2016-03-27 ENCOUNTER — Encounter: Payer: Self-pay | Admitting: *Deleted

## 2016-06-03 ENCOUNTER — Encounter: Payer: Self-pay | Admitting: Emergency Medicine

## 2016-06-03 DIAGNOSIS — R1011 Right upper quadrant pain: Secondary | ICD-10-CM | POA: Diagnosis present

## 2016-06-03 DIAGNOSIS — Z8541 Personal history of malignant neoplasm of cervix uteri: Secondary | ICD-10-CM | POA: Diagnosis not present

## 2016-06-03 DIAGNOSIS — Z79899 Other long term (current) drug therapy: Secondary | ICD-10-CM | POA: Diagnosis not present

## 2016-06-03 DIAGNOSIS — K2970 Gastritis, unspecified, without bleeding: Secondary | ICD-10-CM | POA: Insufficient documentation

## 2016-06-03 DIAGNOSIS — F17210 Nicotine dependence, cigarettes, uncomplicated: Secondary | ICD-10-CM | POA: Insufficient documentation

## 2016-06-03 LAB — CBC
HCT: 37.9 % (ref 35.0–47.0)
Hemoglobin: 12.9 g/dL (ref 12.0–16.0)
MCH: 27 pg (ref 26.0–34.0)
MCHC: 34 g/dL (ref 32.0–36.0)
MCV: 79.3 fL — ABNORMAL LOW (ref 80.0–100.0)
PLATELETS: 240 10*3/uL (ref 150–440)
RBC: 4.79 MIL/uL (ref 3.80–5.20)
RDW: 15.4 % — AB (ref 11.5–14.5)
WBC: 8 10*3/uL (ref 3.6–11.0)

## 2016-06-03 NOTE — ED Notes (Signed)
In at RN request to perform EKG and draw blood and collect urine specimen

## 2016-06-03 NOTE — ED Triage Notes (Signed)
Patient with complaint of intermittent central chest pressure, right upper abdominal pain that started of that last 2 weeks. Patient states that the pain became worse tonight. Patient states that the pain happens when she eats greasy food.

## 2016-06-03 NOTE — ED Notes (Signed)
Patient states unable to provide urine specimen at this time.

## 2016-06-04 ENCOUNTER — Emergency Department: Payer: Medicaid Other

## 2016-06-04 ENCOUNTER — Encounter: Payer: Self-pay | Admitting: Radiology

## 2016-06-04 ENCOUNTER — Emergency Department
Admission: EM | Admit: 2016-06-04 | Discharge: 2016-06-04 | Disposition: A | Discharge status: Home / Self Care | Payer: Medicaid Other | Attending: Emergency Medicine | Admitting: Emergency Medicine

## 2016-06-04 DIAGNOSIS — R1110 Vomiting, unspecified: Secondary | ICD-10-CM

## 2016-06-04 DIAGNOSIS — R1011 Right upper quadrant pain: Secondary | ICD-10-CM

## 2016-06-04 DIAGNOSIS — R1084 Generalized abdominal pain: Secondary | ICD-10-CM

## 2016-06-04 DIAGNOSIS — K2970 Gastritis, unspecified, without bleeding: Secondary | ICD-10-CM

## 2016-06-04 LAB — COMPREHENSIVE METABOLIC PANEL
ALBUMIN: 4.2 g/dL (ref 3.5–5.0)
ALK PHOS: 59 U/L (ref 38–126)
ALT: 11 U/L — ABNORMAL LOW (ref 14–54)
AST: 16 U/L (ref 15–41)
Anion gap: 10 (ref 5–15)
BUN: 12 mg/dL (ref 6–20)
CALCIUM: 9.7 mg/dL (ref 8.9–10.3)
CHLORIDE: 103 mmol/L (ref 101–111)
CO2: 24 mmol/L (ref 22–32)
CREATININE: 0.66 mg/dL (ref 0.44–1.00)
GFR calc Af Amer: >60 mL/min (ref >60)
GFR calc non Af Amer: >60 mL/min (ref >60)
GLUCOSE: 102 mg/dL — AB (ref 65–99)
Potassium: 3.6 mmol/L (ref 3.5–5.1)
SODIUM: 137 mmol/L (ref 135–145)
Total Bilirubin: 0.9 mg/dL (ref 0.3–1.2)
Total Protein: 7.4 g/dL (ref 6.5–8.1)

## 2016-06-04 LAB — URINALYSIS, COMPLETE (UACMP) WITH MICROSCOPIC
Bilirubin Urine: NEGATIVE
Glucose, UA: NEGATIVE mg/dL
HGB URINE DIPSTICK: NEGATIVE
Ketones, ur: NEGATIVE mg/dL
Leukocytes, UA: NEGATIVE
NITRITE: NEGATIVE
PROTEIN: NEGATIVE mg/dL
Specific Gravity, Urine: 1.015 (ref 1.005–1.030)
pH: 5 (ref 5.0–8.0)

## 2016-06-04 LAB — TROPONIN I

## 2016-06-04 LAB — LIPASE, BLOOD: LIPASE: 23 U/L (ref 11–51)

## 2016-06-04 MED ORDER — ONDANSETRON HCL 4 MG/2ML IJ SOLN
4.0000 mg | Freq: Once | INTRAMUSCULAR | Status: AC
  Administered 2016-06-04: 4 mg via INTRAVENOUS

## 2016-06-04 MED ORDER — MORPHINE SULFATE (PF) 2 MG/ML IV SOLN
2.0000 mg | Freq: Once | INTRAVENOUS | Status: AC
  Administered 2016-06-04: 2 mg via INTRAVENOUS
  Filled 2016-06-04: qty 1

## 2016-06-04 MED ORDER — IOPAMIDOL (ISOVUE-300) INJECTION 61%
15.0000 mL | INTRAVENOUS | Status: AC
  Administered 2016-06-04 (×2): 15 mL via ORAL

## 2016-06-04 MED ORDER — ONDANSETRON HCL 4 MG/2ML IJ SOLN
4.0000 mg | Freq: Once | INTRAMUSCULAR | Status: DC
  Filled 2016-06-04: qty 2

## 2016-06-04 MED ORDER — PROMETHAZINE HCL 12.5 MG PO TABS: 12.5000 mg | ORAL_TABLET | Freq: Four times a day (QID) | ORAL | 0 refills | Status: AC | PRN

## 2016-06-04 MED ORDER — ONDANSETRON HCL 4 MG/2ML IJ SOLN
INTRAMUSCULAR | Status: AC
  Administered 2016-06-04: 4 mg via INTRAVENOUS
  Filled 2016-06-04: qty 2

## 2016-06-04 MED ORDER — MORPHINE SULFATE (PF) 2 MG/ML IV SOLN
2.0000 mg | Freq: Once | INTRAVENOUS | Status: DC
  Filled 2016-06-04: qty 1

## 2016-06-04 MED ORDER — PROMETHAZINE HCL 25 MG/ML IJ SOLN
12.5000 mg | Freq: Once | INTRAMUSCULAR | Status: AC
  Administered 2016-06-04: 12.5 mg via INTRAVENOUS
  Filled 2016-06-04: qty 1

## 2016-06-04 MED ORDER — FAMOTIDINE 20 MG PO TABS: 20.0000 mg | ORAL_TABLET | Freq: Two times a day (BID) | ORAL | 0 refills | Status: DC

## 2016-06-04 MED ORDER — GI COCKTAIL ~~LOC~~
30.0000 mL | Freq: Once | ORAL | Status: AC
  Administered 2016-06-04: 30 mL via ORAL
  Filled 2016-06-04: qty 30

## 2016-06-04 MED ORDER — IOPAMIDOL (ISOVUE-300) INJECTION 61%
100.0000 mL | Freq: Once | INTRAVENOUS | Status: AC | PRN
  Administered 2016-06-04: 100 mL via INTRAVENOUS

## 2016-06-04 NOTE — ED Notes (Signed)
Patient drinking contrast without difficulty.

## 2016-06-04 NOTE — ED Provider Notes (Signed)
-----------------------------------------   9:45 AM on 06/04/2016 -----------------------------------------  Anatomy by Dr. Owens Shark patient with a somewhat moving abdominal pain. At this time she has no right upper or right lower quadrant pain she only has epigastric pain which is feeling like acid. She is also nauseated. Patient was able to drink all the contrast with no difficulty. She has had a reassuring CT scan reassuring right upper quadrant ultrasound reassuring blood work reassuring vital signs and reassuring exams on a prolonged basis since she came here despite 2:30. She states she still has some mild nausea after drinking the contrast she wants "something else besides Phenergan", I do not think this represents acute cardiac syndrome or referred chest pain in her abdomen is completely benign. Patient has had extensive inadequate workup here. I have advised her that we'll try letting her stomach because of this time she is complaining of acid-like symptoms in her stomach. She seems to have many different complaints tonight and pains in many different spots but sometimes abdominal pain is difficult for patient to localize. We will try GI cocktail, she is requesting Phenergan even though again she has not vomited in a very long time and was able to hold down all the contrast she still has sensation of nausea now again. This is most likely a viral pathology, nothing to suggest ACS PE or dissection. On exam she has reproducible epigastric discomfort which is mild and she is otherwise in no acute distress. At this time, given that she has had a hysterectomy, I don't think pelvic exam is warranted and patient declines any way. Nor do I think further workup is indicated given the extensive and comprehensive workup of Dr. Owens Shark for this vomiting, and diffuse abdominal discomfort which is now localized to the epigastric region, it is reproducible &  adequately imaged.   Schuyler Amor, MD 06/04/16 [phone]

## 2016-06-04 NOTE — ED Notes (Signed)
Patient returned from ultrasound.

## 2016-06-04 NOTE — ED Notes (Signed)
Pt finished contrast. CT notified 

## 2016-06-04 NOTE — ED Provider Notes (Signed)
[facility] Emergency Department Provider Note    First MD Initiated Contact with Patient 06/04/16 [phone]     (approximate)  I have reviewed the triage vital signs and the nursing notes.   HISTORY  Chief Complaint Chest Pain; Nausea; and Abdominal Pain    HPI Natalie Duffy is a 36 y.o. female   Presents with 2 week history of right upper quadrant abdominal pain which patient statesis worsened when she eats greasy food Patient states however starting tonight pain acutely worsened with resultant nausea.   Past Medical History:  Diagnosis Date  . Abnormal Pap smear 2004  . Anemia   . Anxiety   . Cancer [facility]) 2004   cervical  . History of blood transfusion 2007, 2008   Last transfusion 2008 at [facility] 1 unit   . Insomnia   . Interstitial cystitis   . Kidney stones   . Migraine   . Pyelonephritis   . SVD (spontaneous vaginal delivery)    x 3  . Urinary tract infection     Patient Active Problem List   Diagnosis Date Noted  . Status post vaginal hysterectomy 01/04/2016  . Left ovarian cyst 06/09/2014  . Anovulatory bleeding 02/09/2014  . Anxiety 09/16/2013  . History of conization of cervix 04/02/2012  . Migraine 01/31/2011  . PALPITATIONS 10/16/2008    Past Surgical History:  Procedure Laterality Date  . ABDOMINAL HYSTERECTOMY    . BREAST SURGERY     augmentation  . CESAREAN SECTION  2005  . cone Bx  2004  . CYSTOSCOPY  2007  . DILATION AND CURETTAGE OF UTERUS    . HYSTEROSCOPY  2007  . LEEP  2004  . TUBAL LIGATION Bilateral 08/14/2012   Procedure: POST PARTUM TUBAL LIGATION;  Surgeon: Woodroe Mode, MD;  Location: [place] [facility];  Service: Gynecology;  Laterality: Bilateral;  . VAGINAL HYSTERECTOMY N/A 01/04/2016   Procedure: HYSTERECTOMY VAGINAL;  Surgeon: Donnamae Jude, MD;  Location: [place] [facility];  Service: Gynecology;  Laterality: N/A;  . WISDOM TOOTH EXTRACTION      Prior to Admission medications   Medication Sig Start Date End Date  Taking? Authorizing Provider  doxepin (SINEQUAN) 100 MG capsule Take 1 capsule (100 mg total) by mouth at bedtime. 12/28/15   Donnamae Jude, MD  naproxen (NAPROSYN) 500 MG tablet Take 1 tablet (500 mg total) by mouth 2 (two) times daily. Patient not taking: Reported on 12/31/2015 11/01/15   Carlisle Cater, PA-C  ondansetron (ZOFRAN ODT) 4 MG disintegrating tablet Take 1 tablet (4 mg total) by mouth every 8 (eight) hours as needed for nausea or vomiting. Patient not taking: Reported on 12/28/2015 11/01/15   Carlisle Cater, PA-C  oxyCODONE-acetaminophen (PERCOCET/ROXICET) 5-325 MG tablet Take 1-2 tablets by mouth every 4 (four) hours as needed for severe pain (moderate to severe pain (when tolerating fluids)). 01/05/16   Donnamae Jude, MD  promethazine (PHENERGAN) 25 MG tablet TAKE 1 TABLET BY MOUTH EVERY 6 HOURS AS NEEDED FOR NAUSEA OR VOMITING Patient not taking: Reported on 12/28/2015 04/21/15   Paticia Stack, PA-C  rizatriptan (MAXALT) 10 MG tablet Take 1 tablet (10 mg total) by mouth as needed for migraine. May repeat in 2 hours if needed Patient taking differently: Take 10 mg by mouth at bedtime as needed for migraine. May repeat in 2 hours if needed 12/28/15   Donnamae Jude, MD  simethicone (MYLICON) 80 MG chewable tablet Chew 80 mg by mouth every 6 (six)  hours as needed for flatulence.    Historical Provider, MD  traMADol (ULTRAM) 50 MG tablet Take 1 tablet (50 mg total) by mouth every 6 (six) hours as needed. Patient not taking: Reported on 12/28/2015 11/01/15   Carlisle Cater, PA-C    Allergies Vancomycin; Procardia [nifedipine]; Z-pak [azithromycin]; Amoxicillin; Clindamycin/lincomycin; Erythromycin; Penicillins; and Sulfa antibiotics  Family History  Problem Relation Age of Onset  . Hypertension Father   . Cancer Maternal Grandmother     ovarian/lung/brain  . Cancer Paternal Grandfather     lung/prostate cancer    Social History Social History  Substance Use Topics  . Smoking status:  Former Smoker    Packs/day: 0.25    Years: 15.00    Types: Cigarettes    Quit date: 07/21/2014  . Smokeless tobacco: Current User     Comment: E-cigarettes daily  . Alcohol use No     Comment: seldom    Review of Systems Constitutional: No fever/chills Eyes: No visual changes. ENT: No sore throat. Cardiovascular: Denies chest pain. Respiratory: Denies shortness of breath. Gastrointestinal:  Positive for abdominal pain and nausea, no vomiting.  No diarrhea.  No constipation. Genitourinary: Negative for dysuria. Musculoskeletal: Negative for back pain. Skin: Negative for rash. Neurological: Negative for headaches, focal weakness or numbness.  10-point ROS otherwise negative.  ____________________________________________   PHYSICAL EXAM:  VITAL SIGNS: ED Triage Vitals  Enc Vitals Group     BP 06/03/16 2335 125/77     Pulse Rate 06/03/16 2335 77     Resp 06/03/16 2335 18     Temp 06/03/16 2335 98 F (36.7 C)     Temp Source 06/03/16 2335 Oral     SpO2 06/03/16 2335 99 %     Weight --      Height 06/03/16 2340 5\' 3"  (1.6 m)     Head Circumference --      Peak Flow --      Pain Score 06/03/16 2340 6     Pain Loc --      Pain Edu? --      Excl. in [place]? --     Constitutional: Alert and oriented. Apparent discomfort  Eyes: Conjunctivae are normal. PERRL. EOMI. Head: Atraumatic. Mouth/Throat: Mucous membranes are moist.  Oropharynx non-erythematous. Neck: No stridor.   Cardiovascular: Normal rate, regular rhythm. Good peripheral circulation. Grossly normal heart sounds. Respiratory: Normal respiratory effort.  No retractions. Lungs CTAB. Gastrointestinal: Right upper quadrant/right lower quadrant tenderness to palpation. No distention.  Musculoskeletal: No lower extremity tenderness nor edema. No gross deformities of extremities. Neurologic:  Normal speech and language. No gross focal neurologic deficits are appreciated.  Skin:  Skin is warm, dry and intact. No rash  noted.   ____________________________________________   LABS (all labs ordered are listed, but only abnormal results are displayed)  Labs Reviewed  COMPREHENSIVE METABOLIC PANEL - Abnormal; Notable for the following:       Result Value   Glucose, Bld 102 (*)    ALT 11 (*)    All other components within normal limits  CBC - Abnormal; Notable for the following:    MCV 79.3 (*)    RDW 15.4 (*)    All other components within normal limits  URINALYSIS, COMPLETE (UACMP) WITH MICROSCOPIC - Abnormal; Notable for the following:    Color, Urine YELLOW (*)    APPearance CLEAR (*)    Bacteria, UA RARE (*)    Squamous Epithelial / LPF 0-5 (*)    All other components  within normal limits  LIPASE, BLOOD  TROPONIN I     RADIOLOGY I, Manc[place] N Lameisha Schuenemann, personally viewed and evaluated these images (plain radiographs) as part of my medical decision making, as well as reviewing the written report by the radiologist.  US Abdomen Limited Ruq  Result Date: 06/04/2016 CLINICAL DATA:  Right upper quadrant pain radiating to the back between the shoulder blades with intermittent pain for 2 weeks. EXAM: US ABDOMEN LIMITED - RIGHT UPPER QUADRANT COMPARISON:  Ultrasound abdomen 07/09/2014. CT abdomen and pelvis 06/04/2014 FINDINGS: Gallbladder: No gallstones or wall thickening visualized. No sonographic Murphy sign noted by sonographer. Common bile duct: Diameter: 2.4 mm, normal Liver: No focal lesion identified. Within normal limits in parenchymal echogenicity. IMPRESSION: Normal examination. Electronically Signed   By: Lucienne Capers M.D.   On: 06/04/2016 05:27     Procedures   _____________   INITIAL IMPRESSION / ASSESSMENT AND PLAN / ED COURSE  Pertinent labs & imaging results that were available during my care of the patient were reviewed by me and considered in my medical decision making (see chart for details).  36 year old female presenting to the emergency department with 2 week history  of right upper quadrant pain worse following eating. All history concerning for possible bladder disease on examination patient had right upper quadrant and right lower quadrant tenderness. Ultrasound of the abdomen performed which revealed no acute pathology of the gallbladder. As such CT scan of the abdomen and pelvis ordered to evaluate the appendix. Patient received IV morphine on arrival to the emergency department improvement of pain. Patient's care transferred to Dr. Burlene Arnt pending CT scan results if negative patient cleared to be discharged home   Clinical Course     ____________________________________________  FINAL CLINICAL IMPRESSION(S) / ED DIAGNOSES  Final diagnoses:  Vomiting  RUQ pain     MEDICATIONS GIVEN DURING THIS VISIT:  Medications  morphine 2 MG/ML injection 2 mg (2 mg Intravenous Not Given 06/04/16 0436)  ondansetron (ZOFRAN) injection 4 mg (4 mg Intravenous Not Given 06/04/16 0437)  morphine 2 MG/ML injection 2 mg (2 mg Intravenous Given 06/04/16 0430)  ondansetron (ZOFRAN) injection 4 mg (4 mg Intravenous Given 06/04/16 0430)  iopamidol (ISOVUE-300) 61 % injection 15 mL (15 mLs Oral Contrast Given 06/04/16 0707)  ondansetron (ZOFRAN) injection 4 mg (4 mg Intravenous Given 06/04/16 0622)     NEW OUTPATIENT MEDICATIONS STARTED DURING THIS VISIT:  New Prescriptions   No medications on file    Modified Medications   No medications on file    Discontinued Medications   No medications on file     Note:  This document was prepared using Dragon voice recognition software and may include unintentional dictation errors.    Gregor Hams, MD 06/04/16 [phone]

## 2016-06-04 NOTE — ED Notes (Signed)
Patient reports symptoms off/on for approximately 2 weeks, worse tonight.  Reports pain had not resolved tonight.  States pain starts at right upper quad and then radiates up into center of chest and she can feel it in her back between her shoulder blades.  Patient reports + nausea and vomiting.

## 2017-01-21 ENCOUNTER — Emergency Department
Admission: EM | Admit: 2017-01-21 | Discharge: 2017-01-21 | Disposition: A | Discharge status: Home / Self Care | Payer: Medicaid Other | Attending: Emergency Medicine | Admitting: Emergency Medicine

## 2017-01-21 ENCOUNTER — Encounter: Payer: Self-pay | Admitting: Emergency Medicine

## 2017-01-21 DIAGNOSIS — Z79899 Other long term (current) drug therapy: Secondary | ICD-10-CM | POA: Insufficient documentation

## 2017-01-21 DIAGNOSIS — N200 Calculus of kidney: Secondary | ICD-10-CM | POA: Diagnosis not present

## 2017-01-21 DIAGNOSIS — F17290 Nicotine dependence, other tobacco product, uncomplicated: Secondary | ICD-10-CM | POA: Diagnosis not present

## 2017-01-21 DIAGNOSIS — R1032 Left lower quadrant pain: Secondary | ICD-10-CM | POA: Diagnosis present

## 2017-01-21 LAB — COMPREHENSIVE METABOLIC PANEL
ALBUMIN: 4.2 g/dL (ref 3.5–5.0)
ALK PHOS: 48 U/L (ref 38–126)
ALT: 10 U/L — AB (ref 14–54)
ANION GAP: 6 (ref 5–15)
AST: 15 U/L (ref 15–41)
BILIRUBIN TOTAL: 1.1 mg/dL (ref 0.3–1.2)
BUN: 12 mg/dL (ref 6–20)
CALCIUM: 8.6 mg/dL — AB (ref 8.9–10.3)
CO2: 26 mmol/L (ref 22–32)
CREATININE: 0.6 mg/dL (ref 0.44–1.00)
Chloride: 106 mmol/L (ref 101–111)
GFR calc Af Amer: >60 mL/min (ref >60)
GFR calc non Af Amer: >60 mL/min (ref >60)
GLUCOSE: 108 mg/dL — AB (ref 65–99)
Potassium: 3.5 mmol/L (ref 3.5–5.1)
SODIUM: 138 mmol/L (ref 135–145)
TOTAL PROTEIN: 7.1 g/dL (ref 6.5–8.1)

## 2017-01-21 LAB — URINALYSIS, COMPLETE (UACMP) WITH MICROSCOPIC
Bilirubin Urine: NEGATIVE
GLUCOSE, UA: NEGATIVE mg/dL
KETONES UR: NEGATIVE mg/dL
Leukocytes, UA: NEGATIVE
Nitrite: NEGATIVE
PH: 5 (ref 5.0–8.0)
Protein, ur: NEGATIVE mg/dL
SPECIFIC GRAVITY, URINE: 1.025 (ref 1.005–1.030)

## 2017-01-21 LAB — CBC
HCT: 36 % (ref 35.0–47.0)
Hemoglobin: 12.5 g/dL (ref 12.0–16.0)
MCH: 28.7 pg (ref 26.0–34.0)
MCHC: 34.6 g/dL (ref 32.0–36.0)
MCV: 82.9 fL (ref 80.0–100.0)
PLATELETS: 214 10*3/uL (ref 150–440)
RBC: 4.34 MIL/uL (ref 3.80–5.20)
RDW: 12.9 % (ref 11.5–14.5)
WBC: 6.5 10*3/uL (ref 3.6–11.0)

## 2017-01-21 LAB — LIPASE, BLOOD: LIPASE: 29 U/L (ref 11–51)

## 2017-01-21 MED ORDER — TAMSULOSIN HCL 0.4 MG PO CAPS: 0.4000 mg | ORAL_CAPSULE | Freq: Every day | ORAL | 0 refills | Status: DC

## 2017-01-21 MED ORDER — SODIUM CHLORIDE 0.9 % IV BOLUS (SEPSIS)
1000.0000 mL | Freq: Once | INTRAVENOUS | Status: AC
  Administered 2017-01-21: 1000 mL via INTRAVENOUS

## 2017-01-21 MED ORDER — OXYCODONE-ACETAMINOPHEN 5-325 MG PO TABS: 1.0000 | ORAL_TABLET | Freq: Four times a day (QID) | ORAL | 0 refills | Status: DC | PRN

## 2017-01-21 MED ORDER — KETOROLAC TROMETHAMINE 30 MG/ML IJ SOLN
15.0000 mg | Freq: Once | INTRAMUSCULAR | Status: AC
  Administered 2017-01-21: 15 mg via INTRAVENOUS
  Filled 2017-01-21: qty 1

## 2017-01-21 MED ORDER — ONDANSETRON HCL 4 MG/2ML IJ SOLN
4.0000 mg | Freq: Once | INTRAMUSCULAR | Status: AC
Start: 2017-01-21 — End: 2017-01-21
  Administered 2017-01-21: 4 mg via INTRAVENOUS
  Filled 2017-01-21: qty 2

## 2017-01-21 MED ORDER — NITROFURANTOIN MONOHYD MACRO 100 MG PO CAPS: 100.0000 mg | ORAL_CAPSULE | Freq: Two times a day (BID) | ORAL | 0 refills | Status: AC

## 2017-01-21 NOTE — ED Provider Notes (Signed)
[facility] Emergency Department Provider Note  Time seen: 3:01 PM  I have reviewed the triage vital signs and the nursing notes.   HISTORY  Chief Complaint Flank Pain    HPI Natalie Duffy is a 36 y.o. female With a past medical history of anemia, cervical cancer status post hysterectomy, pyelonephritis, kidney stones, presents to the emergency department with left flank pain and mild dysuria. According to the patient for the past 2 days she has been experiencing left flank pain from her left back radiating into her left abdomen. She also states urinary frequency with some mild dysuria and noted blood in her urine yesterday. Patient has a history of kidney stones as well as kidney infections. Denies any fever, does state nausea but has not vomited. Denies diarrhea black or bloody stool. Denies vaginal bleeding, status post hysterectomy one year ago. Describes her pain as moderate 7/10 pain aching-type pain in her left flank.  Past Medical History:  Diagnosis Date  . Abnormal Pap smear 2004  . Anemia   . Anxiety   . Cancer [facility]) 2004   cervical  . History of blood transfusion 2007, 2008   Last transfusion 2008 at [facility] 1 unit   . Insomnia   . Interstitial cystitis   . Kidney stones   . Migraine   . Pyelonephritis   . SVD (spontaneous vaginal delivery)    x 3  . Urinary tract infection     Patient Active Problem List   Diagnosis Date Noted  . Status post vaginal hysterectomy 01/04/2016  . Left ovarian cyst 06/09/2014  . Anovulatory bleeding 02/09/2014  . Anxiety 09/16/2013  . History of conization of cervix 04/02/2012  . Migraine 01/31/2011  . PALPITATIONS 10/16/2008    Past Surgical History:  Procedure Laterality Date  . ABDOMINAL HYSTERECTOMY    . BREAST SURGERY     augmentation  . CESAREAN SECTION  2005  . cone Bx  2004  . CYSTOSCOPY  2007  . DILATION AND CURETTAGE OF UTERUS    . HYSTEROSCOPY  2007  . LEEP  2004  . TUBAL LIGATION  Bilateral 08/14/2012   Procedure: POST PARTUM TUBAL LIGATION;  Surgeon: Woodroe Mode, MD;  Location: [facility];  Service: Gynecology;  Laterality: Bilateral;  . VAGINAL HYSTERECTOMY N/A 01/04/2016   Procedure: HYSTERECTOMY VAGINAL;  Surgeon: Donnamae Jude, MD;  Location: [facility];  Service: Gynecology;  Laterality: N/A;  . WISDOM TOOTH EXTRACTION      Prior to Admission medications   Medication Sig Start Date End Date Taking? Authorizing Provider  doxepin (SINEQUAN) 100 MG capsule Take 1 capsule (100 mg total) by mouth at bedtime. 12/28/15   Donnamae Jude, MD  famotidine (PEPCID) 20 MG tablet Take 1 tablet (20 mg total) by mouth 2 (two) times daily. 06/04/16 06/04/17  Schuyler Amor, MD  naproxen (NAPROSYN) 500 MG tablet Take 1 tablet (500 mg total) by mouth 2 (two) times daily. Patient not taking: Reported on 12/31/2015 11/01/15   Carlisle Cater, PA-C  ondansetron (ZOFRAN ODT) 4 MG disintegrating tablet Take 1 tablet (4 mg total) by mouth every 8 (eight) hours as needed for nausea or vomiting. Patient not taking: Reported on 12/28/2015 11/01/15   Carlisle Cater, PA-C  oxyCODONE-acetaminophen (PERCOCET/ROXICET) 5-325 MG tablet Take 1-2 tablets by mouth every 4 (four) hours as needed for severe pain (moderate to severe pain (when tolerating fluids)). 01/05/16   Donnamae Jude, MD  promethazine (PHENERGAN) 12.5 MG tablet Take 1 tablet (  12.5 mg total) by mouth every 6 (six) hours as needed for nausea or vomiting. 06/04/16   Schuyler Amor, MD  rizatriptan (MAXALT) 10 MG tablet Take 1 tablet (10 mg total) by mouth as needed for migraine. May repeat in 2 hours if needed Patient taking differently: Take 10 mg by mouth at bedtime as needed for migraine. May repeat in 2 hours if needed 12/28/15   Donnamae Jude, MD  simethicone (MYLICON) 80 MG chewable tablet Chew 80 mg by mouth every 6 (six) hours as needed for flatulence.    [provider]  traMADol (ULTRAM) 50 MG tablet Take 1 tablet (50 mg total) by  mouth every 6 (six) hours as needed. Patient not taking: Reported on 12/28/2015 11/01/15   Carlisle Cater, PA-C    Allergies  Allergen Reactions  . Vancomycin Anaphylaxis  . Procardia [Nifedipine] Swelling  . Z-Pak [Azithromycin] Other (See Comments)    Reaction unknown; a childhood allergy.   . Amoxicillin Rash  . Clindamycin/Lincomycin Rash  . Erythromycin Rash  . Penicillins Rash    Has patient had a PCN reaction causing immediate rash, facial/tongue/throat swelling, SOB or lightheadedness with hypotension: Yes Has patient had a PCN reaction causing severe rash involving mucus membranes or skin necrosis: No Has patient had a PCN reaction that required hospitalization No Has patient had a PCN reaction occurring within the last 10 years: No If all of the above answers are "NO", then may proceed with Cephalosporin use.   . Sulfa Antibiotics Rash    Family History  Problem Relation Age of Onset  . Hypertension Father   . Cancer Maternal Grandmother        ovarian/lung/brain  . Cancer Paternal Grandfather        lung/prostate cancer    Social History Social History  Substance Use Topics  . Smoking status: Former Smoker    Packs/day: 0.25    Years: 15.00    Types: Cigarettes    Quit date: 07/21/2014  . Smokeless tobacco: Current User     Comment: E-cigarettes daily  . Alcohol use No     Comment: seldom    Review of Systems Constitutional: Negative for fever. Cardiovascular: Negative for chest pain. Respiratory: Negative for shortness of breath. Gastrointestinal: left flank pain. Positive for nausea. Negative for vomiting or diarrhea. Genitourinary: mild dysuria, positive for hematuria. Musculoskeletal: left back pain All other ROS negative  ____________________________________________   PHYSICAL EXAM:  VITAL SIGNS: ED Triage Vitals  Enc Vitals Group     BP 01/21/17 1440 117/80     Pulse Rate 01/21/17 1440 82     Resp 01/21/17 1440 16     Temp 01/21/17 1440  98 F (36.7 C)     Temp Source 01/21/17 1440 Oral     SpO2 01/21/17 1440 100 %     Weight 01/21/17 1441 120 lb (54.4 kg)     Height 01/21/17 1441 5\' 3"  (1.6 m)     Head Circumference --      Peak Flow --      Pain Score 01/21/17 1440 7     Pain Loc --      Pain Edu? --      Excl. in [place]? --     Constitutional: Alert and oriented. Well appearing and in no distress. Eyes: Normal exam ENT   Head: Normocephalic and atraumatic.   Mouth/Throat: Mucous membranes are moist. Cardiovascular: Normal rate, regular rhythm. No murmur Respiratory: Normal respiratory effort without tachypnea nor  retractions. Breath sounds are clear  Gastrointestinal: soft, mild left lower quadrant tenderness palpation. no rebound or guarding. No distention.Mild right CVA tenderness with moderate left CVA tenderness. Musculoskeletal: Nontender with normal range of motion in all extremities.  Neurologic:  Normal speech and language. No gross focal neurologic deficits Skin:  Skin is warm, dry and intact.  Psychiatric: Mood and affect are normal.   ____________________________________________   INITIAL IMPRESSION / ASSESSMENT AND PLAN / ED COURSE  Pertinent labs & imaging results that were available during my care of the patient were reviewed by me and considered in my medical decision making (see chart for details).  patient presents to the emergency department with left flank painfor the past 2 days associated with nausea, mild dysuria and positive for hematuria. Patient has a history of kidney stones to which this feels very similar per patient. However she also has a history of kidney infections. We will check labs, urinalysis, treat discomfort nausea and IV hydrate while awaiting results. Patient agreeable to plan.  urinalysis shows blood with a few bacteria but no white blood cells. I believe the patient's urine and symptoms are most suggestive of ureterolithiasis. Patient has never required a procedure  for a stone they have all passed on their own. At this point I believe we can hold off on a CT scan and treat the patient symptomatically with pain medication, Flomax and cover with an antibiotic given few bacteria in the urine. A urine culture has been sent. Patient is agreeable to this plan. States good relief after Toradol. IV fluids are currently infusing. ____________________________________________   FINAL CLINICAL IMPRESSION(S) / ED DIAGNOSES  left flank pain kidney stone   Harvest Dark, MD 01/21/17 [phone]

## 2017-01-21 NOTE — Discharge Instructions (Signed)
as we discussed please follow-up with a urologist by calling the number provided. Return to the emergency department for any worsening pain or for any fever.

## 2017-01-21 NOTE — ED Triage Notes (Signed)
C/O left flank pain and dysuria x 1 day.

## 2017-01-21 NOTE — ED Notes (Signed)
FIRST NURSE NOTE:  Possible UTI/Kidney infection.

## 2017-01-21 NOTE — ED Notes (Signed)
Fluids stopped in error. Pt still receiving fluids at this time

## 2017-01-23 LAB — URINE CULTURE

## 2019-06-16 ENCOUNTER — Ambulatory Visit: Payer: BC Managed Care – PPO | Attending: Internal Medicine

## 2019-06-16 DIAGNOSIS — Z20822 Contact with and (suspected) exposure to covid-19: Secondary | ICD-10-CM | POA: Insufficient documentation

## 2019-06-17 LAB — NOVEL CORONAVIRUS, NAA: SARS-CoV-2, NAA: NOT DETECTED

## 2019-07-06 ENCOUNTER — Other Ambulatory Visit: Payer: Self-pay

## 2019-07-06 ENCOUNTER — Ambulatory Visit
Admission: EM | Admit: 2019-07-06 | Discharge: 2019-07-06 | Disposition: A | Discharge status: Home / Self Care | Payer: BC Managed Care – PPO

## 2019-07-06 ENCOUNTER — Encounter: Payer: Self-pay | Admitting: Emergency Medicine

## 2019-07-06 ENCOUNTER — Ambulatory Visit (INDEPENDENT_AMBULATORY_CARE_PROVIDER_SITE_OTHER): Payer: BC Managed Care – PPO

## 2019-07-06 DIAGNOSIS — Y93K1 Activity, walking an animal: Secondary | ICD-10-CM | POA: Diagnosis not present

## 2019-07-06 DIAGNOSIS — M25511 Pain in right shoulder: Secondary | ICD-10-CM

## 2019-07-06 DIAGNOSIS — M5412 Radiculopathy, cervical region: Secondary | ICD-10-CM

## 2019-07-06 MED ORDER — METHYLPREDNISOLONE 4 MG PO TBPK: ORAL_TABLET | ORAL | 0 refills | Status: AC

## 2019-07-06 MED ORDER — TRAMADOL-ACETAMINOPHEN 37.5-325 MG PO TABS: 1.0000 | ORAL_TABLET | Freq: Three times a day (TID) | ORAL | 0 refills | Status: AC | PRN

## 2019-07-06 NOTE — ED Provider Notes (Signed)
[place], [place]   Name: Natalie Duffy DOB: 09-28-1980 MRN: [id number] CSN: [id number] PCP: Rusty Aus, MD  Arrival date and time:  07/06/19 1141  Chief Complaint:  Fall and Shoulder Pain   NOTE: Prior to seeing the patient today, I have reviewed the triage nursing documentation and vital signs. Clinical staff has updated patient's PMH/PSHx, current medication list, and drug allergies/intolerances to ensure comprehensive history available to assist in medical decision making.   History:   HPI: Natalie Duffy is a 39 y.o. female who presents today with complaints of pain in her RIGHT shoulder following a fall. Patient reports that she was walking her dog (boxer) on a leash when the animal began to run. Patient was pulled to the ground and drug a short distance; fell forwards. Patient states, "it all happened so fast that I don't remember exactly how I fell". Incident occurred on 06/29/2019. Patient notes that pain started in the RIGHT side of her neck and has radiated into the shoulder and into the distal aspect of the RIGHT upper extremity. Pain in neck has resolved, however pain in shoulder persists. She noted an exacerbation of her symptoms with normal AROM motions, especially abduction. She denies any previous injuries/surgeries on her shoulder. Patient has been alternating heat and ice to help with her pain. Additionally, patient reports that she had a retained supply of Roxicodone that she used for pain. Opioid intervention also did not improve her pain. Despite conservative management at home, patient complains of ongoing pain.   Past Medical History:  Diagnosis Date  . Abnormal Pap smear 2004  . Anemia   . Anxiety   . Cancer [facility]) 2004   cervical  . History of blood transfusion 2007, 2008   Last transfusion 2008 at [facility] 1 unit   . Insomnia   . Interstitial cystitis   . Kidney stones   . Migraine   . Pyelonephritis   . SVD (spontaneous vaginal delivery)    x 3  . Urinary  tract infection     Past Surgical History:  Procedure Laterality Date  . ABDOMINAL HYSTERECTOMY    . BREAST SURGERY     augmentation  . CESAREAN SECTION  2005  . cone Bx  2004  . CYSTOSCOPY  2007  . DILATION AND CURETTAGE OF UTERUS    . HYSTEROSCOPY  2007  . LEEP  2004  . TUBAL LIGATION Bilateral 08/14/2012   Procedure: POST PARTUM TUBAL LIGATION;  Surgeon: Woodroe Mode, MD;  Location: [facility];  Service: Gynecology;  Laterality: Bilateral;  . VAGINAL HYSTERECTOMY N/A 01/04/2016   Procedure: HYSTERECTOMY VAGINAL;  Surgeon: Donnamae Jude, MD;  Location: [facility];  Service: Gynecology;  Laterality: N/A;  . WISDOM TOOTH EXTRACTION      Family History  Problem Relation Age of Onset  . Hypertension Father   . Cancer Maternal Grandmother        ovarian/lung/brain  . Cancer Paternal Grandfather        lung/prostate cancer    Social History   Tobacco Use  . Smoking status: Former Smoker    Packs/day: 0.25    Years: 15.00    Pack years: 3.75    Types: Cigarettes    Quit date: 07/21/2014    Years since quitting: 4.9  . Smokeless tobacco: Current User  . Tobacco comment: E-cigarettes daily  Substance Use Topics  . Alcohol use: No    Comment: seldom  . Drug use: No    Patient Active Problem  List   Diagnosis Date Noted  . Status post vaginal hysterectomy 01/04/2016  . Left ovarian cyst 06/09/2014  . Anovulatory bleeding 02/09/2014  . Anxiety 09/16/2013  . History of conization of cervix 04/02/2012  . Migraine 01/31/2011  . PALPITATIONS 10/16/2008    Home Medications:    Current Meds  Medication Sig  . cyclobenzaprine (FLEXERIL) 10 MG tablet Take by mouth.  . promethazine (PHENERGAN) 12.5 MG tablet Take 1 tablet (12.5 mg total) by mouth every 6 (six) hours as needed for nausea or vomiting.  . rizatriptan (MAXALT) 10 MG tablet Take 1 tablet (10 mg total) by mouth as needed for migraine. May repeat in 2 hours if needed (Patient taking differently: Take 10 mg by mouth at  bedtime as needed for migraine. May repeat in 2 hours if needed)  . venlafaxine XR (EFFEXOR-XR) 75 MG 24 hr capsule Take by mouth.  . [DISCONTINUED] oxyCODONE-acetaminophen (ROXICET) 5-325 MG tablet Take 1 tablet by mouth every 6 (six) hours as needed.    Allergies:   Vancomycin, Procardia [nifedipine], Z-pak [azithromycin], Amoxicillin, Clindamycin/lincomycin, Erythromycin, Penicillins, and Sulfa antibiotics  Review of Systems (ROS): Review of Systems  Constitutional: Negative for chills and fever.  Respiratory: Negative for cough and shortness of breath.   Cardiovascular: Negative for chest pain and palpitations.  Musculoskeletal:       Acute RIGHT shoulder pain  Skin: Negative for color change, pallor and rash.  Neurological: Negative for dizziness, syncope, weakness, numbness and headaches.  All other systems reviewed and are negative.    Vital Signs: Today's Vitals   07/06/19 1207 07/06/19 1212 07/06/19 1310  BP:  (!) 142/101   Pulse:  83   Resp:  14   Temp:  98.4 F (36.9 C)   TempSrc:  Oral   SpO2:  100%   Weight: 130 lb (59 kg)    Height: 5\' 3"  (1.6 m)    PainSc: 4   4     Physical Exam: Physical Exam  Constitutional: She is oriented to person, place, and time and well-developed, well-nourished, and in no distress.  HENT:  Head: Normocephalic and atraumatic.  Eyes: Pupils are equal, round, and reactive to light.  Cardiovascular: Normal rate and intact distal pulses.  Pulmonary/Chest: Effort normal. No respiratory distress.  Musculoskeletal:     Right shoulder: Tenderness and pain present. No swelling, effusion or crepitus. Decreased range of motion. Normal strength. Normal pulse.     Comments: Pain in anterior RIGHT shoulder with radiation into triceps area and distal forearm. (+) PMS noted distally; color, temperature, and capillary refill all WNL. Paraesthesias increase with extremity abduction.   Neurological: She is alert and oriented to person, place, and  time. Gait normal.  Skin: Skin is warm and dry. No rash noted. She is not diaphoretic.  Psychiatric: Mood, memory, affect and judgment normal.  Nursing note and vitals reviewed.   Urgent Care Treatments / Results:   Orders Placed This Encounter  Procedures  . DG Shoulder Right    LABS: PLEASE NOTE: all labs that were ordered this encounter are listed, however only abnormal results are displayed. Labs Reviewed - No data to display  EKG: -None  RADIOLOGY: DG Shoulder Right  Result Date: 07/06/2019 CLINICAL DATA:  Pain with radicular symptoms 1 week status post fall. EXAM: RIGHT SHOULDER - 2+ VIEW COMPARISON:  None. FINDINGS: There is no evidence of fracture or dislocation. There is no evidence of arthropathy or other focal bone abnormality. Soft tissues are unremarkable. IMPRESSION: Negative.  Electronically Signed   By: Franki Cabot M.D.   On: 07/06/2019 12:45    PROCEDURES: Procedures  MEDICATIONS RECEIVED THIS VISIT: Medications - No data to display  PERTINENT CLINICAL COURSE NOTES/UPDATES:   Initial Impression / Assessment and Plan / Urgent Care Course:  Pertinent labs & imaging results that were available during my care of the patient were personally reviewed by me and considered in my medical decision making (see lab/imaging section of note for values and interpretations).  Shaley Heddings is a 39 y.o. female who presents to [facility] today with complaints of Fall and Shoulder Pain  Patient is well appearing overall in clinic today. She does not appear to be in any acute distress. Presenting symptoms (see HPI) and exam as documented above. Symptoms persist 1 week following mechanical fall. Pain started in neck, thus giving rise to suspicion for radiculopathy. Diagnostic radiographs of the RIGHT shoulder revealed no acute abnormalities; no fracture, dislocation, or joint effusion. Discussed that the absence of an osseous finding on plain film did not rule out  something more serious such as a potential cuff arthropathy. Given radicular symptoms, will pursue treatment using a course of systemic steroids (Medrol). Patient advised to avoid concurrent use of NSAIDs. May use APAP as needed for additional pain control efforts. She was educated on complimentary modalities to help with her pain. Patient encouraged to rest and avoid heavy lifting. Discussed the benefits of stretching. She was advised to continue moist heat application TID-QID for at least 15-20 minutes at a time; written information provided on today's AVS. If pain becomes severe with the aforementioned, patient may use Ultracet as needed; short term Rx sent in.   If not improving, patient will need  to be seen for further evaluation by orthopedics for further evaluation and consideration of more advanced imaging such as a MRI. Patient is a patient at [facility] and will discuss referral, if needed, with Dr. Sabra Heck.   Discussed follow up with primary care physician in 1 week for re-evaluation. I have reviewed the follow up and strict return precautions for any new or worsening symptoms. Patient is aware of symptoms that would be deemed urgent/emergent, and would thus require further evaluation either here or in the emergency department. At the time of discharge, she verbalized understanding and consent with the discharge plan as it was reviewed with her. All questions were fielded by provider and/or clinic staff prior to patient discharge.    Final Clinical Impressions / Urgent Care Diagnoses:   Final diagnoses:  Acute pain of right shoulder  Cervical radiculopathy    New Prescriptions:  [place] Controlled Substance Registry consulted? Yes, I have consulted the [place] Controlled Substances Registry for this patient, and feel the risk/benefit ratio today is favorable for proceeding with this prescription for a controlled substance.  . Discussed use of controlled substance medication to treat her acute  pain.  o Reviewed [place] STOP Act regulations  o Clinic does not refill controlled substances over the phone without face to face evaluation.  . Safety precautions reviewed.  o Medications should not be bitten, chewed, sold, or taken with alcohol.  o Avoid use while working, driving, or operating heavy machinery.  o Side effects associated with the use of this particular medication reviewed. - Patient understands that this medication can cause CNS depression, increase her risk of falls, and even lead to overdose that may result in death, if used outside of the parameters that she and I discussed.  With  all of this in mind, she knowingly accepts the risks and responsibilities associated with intended course of treatment, and elects to responsibly proceed as discussed.  Meds ordered this encounter  Medications  . methylPREDNISolone (MEDROL DOSEPAK) 4 MG TBPK tablet    Sig: Take by mouth daily - taper daily dose per package instructions.    Dispense:  21 tablet    Refill:  0  . traMADol-acetaminophen (ULTRACET) 37.5-325 MG tablet    Sig: Take 1 tablet by mouth every 8 (eight) hours as needed.    Dispense:  15 tablet    Refill:  0    Recommended Follow up Care:  Patient encouraged to follow up with the following provider within the specified time frame, or sooner as dictated by the severity of her symptoms. As always, she was instructed that for any urgent/emergent care needs, she should seek care either here or in the emergency department for more immediate evaluation.  [place], [facility] In 1 week.   Specialty: General Practice Why: General reassessment of symptoms if not improving Contact information: [place]. [place] [address] [phone]         NOTE: This note was prepared using Dragon dictation software along with smaller phrase technology. Despite my best ability to proofread, there is the potential that transcriptional errors  may still occur from this process, and are completely unintentional.    Karen Kitchens, NP 07/06/19 1352

## 2019-07-06 NOTE — ED Triage Notes (Signed)
Patient states that she was walking her boxer dog on a leash when he ran off and caused her to fall last Sunday morning.  Patient states that she fell face down and that the dog dragged her a little ways. Patient c/o ongoing pain in her right shoulder.  Patient has tried alternating with heat and ice.  Patient states she also took an old prescription of Oxycodone for pain with no relief.

## 2019-07-06 NOTE — Discharge Instructions (Addendum)
It was very nice seeing you today in clinic. Thank you for entrusting me with your care.   As discussed, your pain seems to be musculoskeletal in nature. Plans for treating you are as follows:  Please utilize the medications that we discussed. Your prescriptions have been called in to your pharmacy.  May use Tylenol as needed for pain. Will add Ultracet as needed for more severe pain. Be careful as there is Tylenol in this medication as well. It can also make you sleepy.  Avoid overdoing it, but you need to make efforts to remain active as tolerated.  Avoiding activity all together can make your pain worse. You may find that alternating between ice and moist heat application will help with your pain.  Heat/ice should be applied for 15-20 minutes at a time at least 3-4 times a day.  Make arrangements to follow up with your regular doctor in 1 week for re-evaluation. If your symptoms/condition worsens, please seek follow up care either here or in the ER. Please remember, our [place] providers are "right here with you" when you need [place].   Again, it was my pleasure to take care of you today. Thank you for choosing our clinic. I hope that you start to feel better quickly.   Honor Loh, MSN, APRN, FNP-C, CEN Advanced Practice Provider [facility]

## 2019-08-30 ENCOUNTER — Ambulatory Visit (INDEPENDENT_AMBULATORY_CARE_PROVIDER_SITE_OTHER)
Admission: RE | Admit: 2019-08-30 | Discharge: 2019-08-30 | Disposition: A | Discharge status: Home / Self Care | Payer: BC Managed Care – PPO | Source: Ambulatory Visit

## 2019-08-30 DIAGNOSIS — R519 Headache, unspecified: Secondary | ICD-10-CM | POA: Diagnosis not present

## 2019-08-30 DIAGNOSIS — K0889 Other specified disorders of teeth and supporting structures: Secondary | ICD-10-CM

## 2019-08-30 DIAGNOSIS — S025XXA Fracture of tooth (traumatic), initial encounter for closed fracture: Secondary | ICD-10-CM

## 2019-08-30 MED ORDER — DOXYCYCLINE HYCLATE 100 MG PO CAPS: 100.0000 mg | ORAL_CAPSULE | Freq: Two times a day (BID) | ORAL | 0 refills | Status: AC

## 2019-08-30 MED ORDER — TRAMADOL HCL 50 MG PO TABS: 50.0000 mg | ORAL_TABLET | Freq: Four times a day (QID) | ORAL | 0 refills | Status: AC | PRN

## 2019-08-30 NOTE — ED Provider Notes (Signed)
Virtual Visit via Video Note:  Natalie Duffy  initiated request for Telemedicine visit with [facility] team. I connected with Natalie Duffy  on 08/30/2019 at 11:14 AM  for a synchronized telemedicine visit using a video enabled HIPPA compliant telemedicine application. I verified that I am speaking with Natalie Duffy  using two identifiers. Natalie Eagles, PA-C  was physically located in a Natalie Duffy Urgent care site and Natalie Duffy was located at a different location.   The limitations of evaluation and management by telemedicine as well as the availability of in-person appointments were discussed. Patient was informed that she  may incur a bill ( including co-pay) for this virtual visit encounter. Natalie Duffy  expressed understanding and gave verbal consent to proceed with virtual visit.     History of Present Illness:Natalie Duffy  is a 39 y.o. female presents with acute onset in the past 2 days of moderate to severe right upper sided dental pain, facial pain and swelling.  Patient has a history of dental injury/fracture of the upper part of her molar/canine.  She is working with a Pharmacist, community but cannot get in with them until this upcoming week.  Has a significant history of allergies to clindamycin, amoxicillin, penicillin, azithromycin and sulfa drugs.  She has previously run into this issue before with respect to her dental infections, has taken Keflex but did not tolerate it well.  ROS  No current facility-administered medications for this encounter.   Current Outpatient Medications  Medication Sig Dispense Refill  . methylPREDNISolone (MEDROL DOSEPAK) 4 MG TBPK tablet Take by mouth daily - taper daily dose per package instructions. 21 tablet 0  . promethazine (PHENERGAN) 12.5 MG tablet Take 1 tablet (12.5 mg total) by mouth every 6 (six) hours as needed for nausea or vomiting. 8 tablet 0  . rizatriptan (MAXALT) 10 MG tablet Take 1 tablet (10 mg total) by mouth as  needed for migraine. May repeat in 2 hours if needed (Patient taking differently: Take 10 mg by mouth at bedtime as needed for migraine. May repeat in 2 hours if needed) 12 tablet 11  . simethicone (MYLICON) 80 MG chewable tablet Chew 80 mg by mouth every 6 (six) hours as needed for flatulence.    . traMADol-acetaminophen (ULTRACET) 37.5-325 MG tablet Take 1 tablet by mouth every 8 (eight) hours as needed. 15 tablet 0  . venlafaxine XR (EFFEXOR-XR) 75 MG 24 hr capsule Take by mouth.       Allergies  Allergen Reactions  . Vancomycin Anaphylaxis  . Procardia [Nifedipine] Swelling  . Z-Pak [Azithromycin] Other (See Comments)    Reaction unknown; a childhood allergy.   . Amoxicillin Rash  . Clindamycin/Lincomycin Rash  . Erythromycin Rash  . Penicillins Rash    Has patient had a PCN reaction causing immediate rash, facial/tongue/throat swelling, SOB or lightheadedness with hypotension: Yes Has patient had a PCN reaction causing severe rash involving mucus membranes or skin necrosis: No Has patient had a PCN reaction that required hospitalization No Has patient had a PCN reaction occurring within the last 10 years: No If all of the above answers are "NO", then may proceed with Cephalosporin use.   . Sulfa Antibiotics Rash     Past Medical History:  Diagnosis Date  . Abnormal Pap smear 2004  . Anemia   . Anxiety   . Cancer [facility]) 2004   cervical  . History of blood transfusion 2007, 2008   Last transfusion 2008 at [facility] 1 unit   .  Insomnia   . Interstitial cystitis   . Kidney stones   . Migraine   . Pyelonephritis   . SVD (spontaneous vaginal delivery)    x 3  . Urinary tract infection     Past Surgical History:  Procedure Laterality Date  . ABDOMINAL HYSTERECTOMY    . BREAST SURGERY     augmentation  . CESAREAN SECTION  2005  . cone Bx  2004  . CYSTOSCOPY  2007  . DILATION AND CURETTAGE OF UTERUS    . HYSTEROSCOPY  2007  . LEEP  2004  . TUBAL LIGATION Bilateral  08/14/2012   Procedure: POST PARTUM TUBAL LIGATION;  Surgeon: Woodroe Mode, MD;  Location: [facility];  Service: Gynecology;  Laterality: Bilateral;  . VAGINAL HYSTERECTOMY N/A 01/04/2016   Procedure: HYSTERECTOMY VAGINAL;  Surgeon: Donnamae Jude, MD;  Location: [facility];  Service: Gynecology;  Laterality: N/A;  . WISDOM TOOTH EXTRACTION        Observations/Objective: Physical Exam Constitutional:      General: She is not in acute distress.    Appearance: Normal appearance. She is well-developed. She is not ill-appearing, toxic-appearing or diaphoretic.  HENT:     Head:   Eyes:     Extraocular Movements: Extraocular movements intact.  Pulmonary:     Effort: Pulmonary effort is normal.  Neurological:     General: No focal deficit present.     Mental Status: She is alert and oriented to person, place, and time.  Psychiatric:        Mood and Affect: Mood normal.        Behavior: Behavior normal.        Thought Content: Thought content normal.        Judgment: Judgment normal.      Assessment and Plan:  1. Facial pain   2. Pain, dental   3. Closed fracture of tooth, initial encounter    Start doxycycline for dental infections given hx of antibiotic allergies. Use naproxen, APAP and tramadol for breakthrough pain. Counseled patient on potential for adverse effects with medications prescribed/recommended today, ER and return-to-clinic precautions discussed, patient verbalized understanding.    Follow Up Instructions:    I discussed the assessment and treatment plan with the patient. The patient was provided an opportunity to ask questions and all were answered. The patient agreed with the plan and demonstrated an understanding of the instructions.   The patient was advised to call back or seek an in-person evaluation if the symptoms worsen or if the condition fails to improve as anticipated.  I provided 5 minutes of non-face-to-face time during this encounter.    Natalie Eagles,  PA-C  08/30/2019 11:14 AM      Natalie Eagles, PA-C 08/30/19 1154

## 2019-08-30 NOTE — Discharge Instructions (Addendum)
You may take 500mg -650mg  Tylenol with ibuprofen 600mg -800mg  every 6 hours for aches, pains, fevers and general inflammation. Use tramadol for breakthrough pain.

## 2020-11-11 ENCOUNTER — Other Ambulatory Visit: Payer: Self-pay | Admitting: Physician Assistant

## 2020-11-11 DIAGNOSIS — R519 Headache, unspecified: Secondary | ICD-10-CM

## 2020-11-23 ENCOUNTER — Ambulatory Visit: Payer: BC Managed Care – PPO

## 2021-09-11 ENCOUNTER — Emergency Department: Payer: BC Managed Care – PPO

## 2021-09-11 ENCOUNTER — Emergency Department
Admission: EM | Admit: 2021-09-11 | Discharge: 2021-09-11 | Disposition: A | Discharge status: Home / Self Care | Payer: BC Managed Care – PPO | Attending: Emergency Medicine | Admitting: Emergency Medicine

## 2021-09-11 DIAGNOSIS — S20229A Contusion of unspecified back wall of thorax, initial encounter: Secondary | ICD-10-CM

## 2021-09-11 DIAGNOSIS — R519 Headache, unspecified: Secondary | ICD-10-CM | POA: Diagnosis not present

## 2021-09-11 DIAGNOSIS — S3991XA Unspecified injury of abdomen, initial encounter: Secondary | ICD-10-CM | POA: Diagnosis not present

## 2021-09-11 DIAGNOSIS — S299XXA Unspecified injury of thorax, initial encounter: Secondary | ICD-10-CM | POA: Diagnosis present

## 2021-09-11 DIAGNOSIS — S20223A Contusion of bilateral back wall of thorax, initial encounter: Secondary | ICD-10-CM | POA: Diagnosis not present

## 2021-09-11 DIAGNOSIS — W109XXA Fall (on) (from) unspecified stairs and steps, initial encounter: Secondary | ICD-10-CM | POA: Insufficient documentation

## 2021-09-11 DIAGNOSIS — W19XXXA Unspecified fall, initial encounter: Secondary | ICD-10-CM

## 2021-09-11 DIAGNOSIS — Y9301 Activity, walking, marching and hiking: Secondary | ICD-10-CM | POA: Diagnosis not present

## 2021-09-11 DIAGNOSIS — Y92009 Unspecified place in unspecified non-institutional (private) residence as the place of occurrence of the external cause: Secondary | ICD-10-CM | POA: Insufficient documentation

## 2021-09-11 LAB — CBC WITH DIFFERENTIAL/PLATELET
Abs Immature Granulocytes: 0.02 10*3/uL (ref 0.00–0.07)
Basophils Absolute: 0 10*3/uL (ref 0.0–0.1)
Basophils Relative: 0 %
Eosinophils Absolute: 0.1 10*3/uL (ref 0.0–0.5)
Eosinophils Relative: 1 %
HCT: 40.3 % (ref 36.0–46.0)
Hemoglobin: 13.4 g/dL (ref 12.0–15.0)
Immature Granulocytes: 0 %
Lymphocytes Relative: 33 %
Lymphs Abs: 2.3 10*3/uL (ref 0.7–4.0)
MCH: 26.7 pg (ref 26.0–34.0)
MCHC: 33.3 g/dL (ref 30.0–36.0)
MCV: 80.4 fL (ref 80.0–100.0)
Monocytes Absolute: 0.4 10*3/uL (ref 0.1–1.0)
Monocytes Relative: 5 %
Neutro Abs: 4.2 10*3/uL (ref 1.7–7.7)
Neutrophils Relative %: 61 %
Platelets: 294 10*3/uL (ref 150–400)
RBC: 5.01 MIL/uL (ref 3.87–5.11)
RDW: 12.2 % (ref 11.5–15.5)
WBC: 7 10*3/uL (ref 4.0–10.5)
nRBC: 0 % (ref 0.0–0.2)

## 2021-09-11 LAB — COMPREHENSIVE METABOLIC PANEL
ALT: 12 U/L (ref 0–44)
AST: 18 U/L (ref 15–41)
Albumin: 4.3 g/dL (ref 3.5–5.0)
Alkaline Phosphatase: 82 U/L (ref 38–126)
Anion gap: 7 (ref 5–15)
BUN: 10 mg/dL (ref 6–20)
CO2: 24 mmol/L (ref 22–32)
Calcium: 9.2 mg/dL (ref 8.9–10.3)
Chloride: 105 mmol/L (ref 98–111)
Creatinine, Ser: 0.64 mg/dL (ref 0.44–1.00)
GFR, Estimated: >60 mL/min (ref >60)
Glucose, Bld: 94 mg/dL (ref 70–99)
Potassium: 3.7 mmol/L (ref 3.5–5.1)
Sodium: 136 mmol/L (ref 135–145)
Total Bilirubin: 1.2 mg/dL (ref 0.3–1.2)
Total Protein: 7.4 g/dL (ref 6.5–8.1)

## 2021-09-11 MED ORDER — LORAZEPAM 2 MG/ML IJ SOLN
INTRAMUSCULAR | Status: AC
Start: 2021-09-11 — End: 2021-09-11
  Administered 2021-09-11: 1 mg via INTRAVENOUS
  Filled 2021-09-11: qty 1

## 2021-09-11 MED ORDER — ONDANSETRON HCL 4 MG/2ML IJ SOLN
4.0000 mg | Freq: Once | INTRAMUSCULAR | Status: AC
  Administered 2021-09-11: 4 mg via INTRAVENOUS
  Filled 2021-09-11: qty 2

## 2021-09-11 MED ORDER — IOHEXOL 300 MG/ML  SOLN
100.0000 mL | Freq: Once | INTRAMUSCULAR | Status: AC | PRN
Start: 2021-09-11 — End: 2021-09-11
  Administered 2021-09-11: 100 mL via INTRAVENOUS

## 2021-09-11 MED ORDER — MORPHINE SULFATE (PF) 4 MG/ML IV SOLN
4.0000 mg | Freq: Once | INTRAVENOUS | Status: AC
  Administered 2021-09-11: 4 mg via INTRAVENOUS
  Filled 2021-09-11: qty 1

## 2021-09-11 MED ORDER — HYDROCODONE-ACETAMINOPHEN 5-325 MG PO TABS: 1.0000 | ORAL_TABLET | Freq: Four times a day (QID) | ORAL | 0 refills | Status: AC | PRN

## 2021-09-11 NOTE — ED Provider Notes (Signed)
? ?[facility] ?Provider Note ? ? ? Event Date/Time  ? First MD Initiated Contact with Patient 09/11/21 1100   ?  (approximate) ? ? ?History  ? ?Fall ? ? ?HPI ? ?Natalie Duffy is a 41 y.o. female who was walking down the steps at home reported she missed a step and fell down 15 carpeted steps.  She complains of headache and some diffuse back pain that is worse in the mid back above the waist..  She reports she has interstitial cystitis and always has blood in her urine. ? ?  ? ? ?Physical Exam  ? ?Triage Vital Signs: ?ED Triage Vitals  ?Enc Vitals Group  ?   BP 09/11/21 1057 (!) 155/102  ?   Pulse Rate 09/11/21 1057 89  ?   Resp 09/11/21 1057 18  ?   Temp 09/11/21 1057 98.1 ?F (36.7 ?C)  ?   Temp src --   ?   SpO2 09/11/21 1057 100 %  ?   Weight 09/11/21 1058 132 lb 9.6 oz (60.1 kg)  ?   Height 09/11/21 1058 '5\' 3"'$  (1.6 m)  ?   Head Circumference --   ?   Peak Flow --   ?   Pain Score 09/11/21 1057 7  ?   Pain Loc --   ?   Pain Edu? --   ?   Excl. in [place]? --   ? ? ?Most recent vital signs: ?Vitals:  ? 09/11/21 1130 09/11/21 1245  ?BP: (!) 151/106 (!) 148/106  ?Pulse: 80 86  ?Resp:    ?Temp:    ?SpO2: 100% 99%  ? ? ? ?General: Awake, alert complaining of a lot of back pain. ?Head: Normocephalic atraumatic but patient complaining of headache ?Eyes pupils equal round reactive extraocular movements intact ?CV:  Good peripheral perfusion.  Heart regular rate and rhythm no audible murmurs ?Chest: Anteriorly and laterally not tender ?Resp:  Normal effort.  Lungs are clear ?Back: Diffusely tender especially in the mid back. ?Abd:  No distention.  Soft nontender ?Extremities: Some mild bruising and tenderness in the arms but good range of motion and no bony tenderness.  Legs essentially the same. ? ? ?ED Results / Procedures / Treatments  ? ?Labs ?(all labs ordered are listed, but only abnormal results are displayed) ?Labs Reviewed  ?COMPREHENSIVE METABOLIC PANEL  ?CBC WITH DIFFERENTIAL/PLATELET   ? ? ? ?EKG ? ? ? ? ?RADIOLOGY ?CT of the head and neck read by radiology as negative films reviewed by me I agree ?CT of the T-spine read by radiology as negative I also reviewed these films and agree ?CT of the abdomen read is negative except for a possible polypoid lesion in the pylorus which may be normal.  I reviewed these films as well. ?X-ray of the L-spine read by radiology and films reviewed by myself is negative. ? ? ?PROCEDURES: ? ?Critical Care performed:  ?Procedures ? ? ?MEDICATIONS ORDERED IN ED: ?Medications  ?morphine (PF) 4 MG/ML injection 4 mg (4 mg Intravenous Given 09/11/21 1112)  ?ondansetron [facility]) injection 4 mg (4 mg Intravenous Given 09/11/21 1112)  ?iohexol (OMNIPAQUE) 300 MG/ML solution 100 mL (100 mLs Intravenous Contrast Given 09/11/21 1138)  ?LORazepam (ATIVAN) 2 MG/ML injection (1 mg Intravenous Given 09/11/21 1138)  ? ? ? ?IMPRESSION / MDM / ASSESSMENT AND PLAN / ED COURSE  ?I reviewed the triage vital signs and the nursing notes. ?Patient with diffuse back pain and other contusions no obvious fractures nothing going  on with the kidneys liver or spleen on the CT scan.  I will let the patient go.  We will give her Motrin for the pain and a little bit of Vicodin if need be.  I will have her follow-up with her doctor for the CT possible abnormality. ? ? ? ? ? ?  ? ? ?FINAL CLINICAL IMPRESSION(S) / ED DIAGNOSES  ? ?Final diagnoses:  ?Fall, initial encounter  ?Contusion of back wall of thorax, unspecified location, initial encounter  ? ? ? ?Rx / DC Orders  ? ?ED Discharge Orders   ? ?      Ordered  ?  HYDROcodone-acetaminophen (NORCO/VICODIN) 5-325 MG tablet  Every 6 hours PRN       ? 09/11/21 1317  ? ?  ?  ? ?  ? ? ? ?Note:  This document was prepared using Dragon voice recognition software and may include unintentional dictation errors. ?  ?Nena Polio, MD ?09/11/21 1324 ? ?

## 2021-09-11 NOTE — Discharge Instructions (Addendum)
The CAT scans and x-rays do not show any broken bones.  I will let you go home.  Try Motrin 4 of the over-the-counter pills or 800 mg 3 times a day with something on your stomach.  Please have your primary care doctor check on the CT of the abdomen report.  The radiologist thought that the pylorus or stomach valve was possibly thickened.  This is probably nothing but should be checked by your doctor.  I have given you some Vicodin to take 1 pill 4 times a day for severe pain.  Be careful this can make you woozy and constipated.  Do not drive on it.  If the police catch you driving on it you may be considered an impaired driver.  Take it easy for the next couple days but do not lay in bed because that will make you stiffer.  You can use heat or ice to the sore areas but do not fall asleep on them as you can get burns or frostbite. ?

## 2021-09-11 NOTE — ED Notes (Signed)
Pt ambulatory to toilet

## 2021-09-11 NOTE — ED Triage Notes (Signed)
Pt arrived from home with [facility] after mechanical fall down 15 stairs. Pt endorsing lower back pain radiating across back. PT placed in c-collar by EMS and arrives alert and oriented x4. ?

## 2021-09-11 NOTE — ED Notes (Signed)
Dc ppw provided. Pt denies any questions. Pt followup and RX infor provided. Pt provides verbal consent for dc. Pt to lobby with husband alert and oriented x4 ?

## 2024-02-27 ENCOUNTER — Other Ambulatory Visit: Payer: Self-pay

## 2024-02-27 ENCOUNTER — Encounter (HOSPITAL_COMMUNITY): Payer: Self-pay

## 2024-02-27 ENCOUNTER — Emergency Department (HOSPITAL_COMMUNITY)
Admission: EM | Admit: 2024-02-27 | Discharge: 2024-02-27 | Discharge status: Against Medical Advice | Payer: Self-pay | Attending: Emergency Medicine | Admitting: Emergency Medicine

## 2024-02-27 DIAGNOSIS — S81831A Puncture wound without foreign body, right lower leg, initial encounter: Secondary | ICD-10-CM | POA: Insufficient documentation

## 2024-02-27 DIAGNOSIS — W540XXA Bitten by dog, initial encounter: Secondary | ICD-10-CM | POA: Insufficient documentation

## 2024-02-27 DIAGNOSIS — Z5321 Procedure and treatment not carried out due to patient leaving prior to being seen by health care provider: Secondary | ICD-10-CM | POA: Insufficient documentation

## 2024-02-27 DIAGNOSIS — Z23 Encounter for immunization: Secondary | ICD-10-CM | POA: Insufficient documentation

## 2024-02-27 MED ORDER — HYDROCODONE-ACETAMINOPHEN 5-325 MG PO TABS
1.0000 | ORAL_TABLET | Freq: Once | ORAL | Status: AC
  Administered 2024-02-27: 1 via ORAL
  Filled 2024-02-27: qty 1

## 2024-02-27 MED ORDER — ONDANSETRON 4 MG PO TBDP
4.0000 mg | ORAL_TABLET | Freq: Once | ORAL | Status: AC
  Administered 2024-02-27: 4 mg via ORAL
  Filled 2024-02-27: qty 1

## 2024-02-27 MED ORDER — TETANUS-DIPHTH-ACELL PERTUSSIS 5-2-15.5 LF-MCG/0.5 IM SUSP
0.5000 mL | Freq: Once | INTRAMUSCULAR | Status: AC
  Administered 2024-02-27: 0.5 mL via INTRAMUSCULAR
  Filled 2024-02-27: qty 0.5

## 2024-02-27 NOTE — ED Notes (Signed)
 Patient stated she has been waiting since 11, and will go somewhere else.

## 2024-02-27 NOTE — ED Triage Notes (Signed)
 Pt here for dog bite to the back of the inside of her right knee. Pt reports dogs are up to date on vaccines.

## 2024-02-27 NOTE — ED Notes (Signed)
 Pt dressing saturated and bleeding through, dressing changed and bleeding seemed to have slowed down.

## 2024-02-27 NOTE — ED Provider Triage Note (Signed)
 Emergency Medicine Provider Triage Evaluation Note  Natalie Duffy , a 43 y.o. female  was evaluated in triage.  Pt complains of dog bite to posterior right lower extremity.  She states her dogs were fighting with each other when she got to close she got bit.  She has multiple puncture wounds to this area.  Neurovascularly intact.  Without significant active bleed.  Unsure of her last tetanus shot.  But believes it was not within the past 7 years.  She states the dogs are up-to-date on the rabies vaccination.  Review of Systems  Positive: As above Negative: As above  Physical Exam  BP (!) 142/94 (BP Location: Right Arm)   Pulse (!) 104   Temp 98.3 F (36.8 C)   Resp (!) 23   Ht 5' 3 (1.6 m)   Wt 54.4 kg   LMP 12/18/2015 (Approximate) Comment: hysterectomy Aug 2017  SpO2 98%   BMI 21.26 kg/m  Gen:   Awake, no distress   Resp:  Normal effort  MSK:   Moves extremities without difficulty  Other:  Multiple puncture wounds to posterior right lower extremity  Medical Decision Making  Medically screening exam initiated at 12:49 PM.  Appropriate orders placed.  Natalie Duffy was informed that the remainder of the evaluation will be completed by another provider, this initial triage assessment does not replace that evaluation, and the importance of remaining in the ED until their evaluation is complete.     Hildegard Loge, PA-C 02/27/24 1250

## 2024-05-17 ENCOUNTER — Emergency Department
Admission: EM | Admit: 2024-05-17 | Discharge: 2024-05-17 | Disposition: A | Discharge status: Home / Self Care | Payer: Self-pay | Attending: Emergency Medicine | Admitting: Emergency Medicine

## 2024-05-17 ENCOUNTER — Other Ambulatory Visit: Payer: Self-pay

## 2024-05-17 DIAGNOSIS — Z139 Encounter for screening, unspecified: Secondary | ICD-10-CM | POA: Insufficient documentation

## 2024-05-17 LAB — COMPREHENSIVE METABOLIC PANEL WITH GFR
ALT: 14 U/L (ref 0–44)
AST: 18 U/L (ref 15–41)
Albumin: 4.6 g/dL (ref 3.5–5.0)
Alkaline Phosphatase: 70 U/L (ref 38–126)
Anion gap: 10 (ref 5–15)
BUN: 11 mg/dL (ref 6–20)
CO2: 24 mmol/L (ref 22–32)
Calcium: 9 mg/dL (ref 8.9–10.3)
Chloride: 105 mmol/L (ref 98–111)
Creatinine, Ser: 0.64 mg/dL (ref 0.44–1.00)
GFR, Estimated: >60 mL/min
Glucose, Bld: 143 mg/dL — ABNORMAL HIGH (ref 70–99)
Potassium: 4.3 mmol/L (ref 3.5–5.1)
Sodium: 139 mmol/L (ref 135–145)
Total Bilirubin: 1 mg/dL (ref 0.0–1.2)
Total Protein: 7.2 g/dL (ref 6.5–8.1)

## 2024-05-17 LAB — URINALYSIS, ROUTINE W REFLEX MICROSCOPIC
Bilirubin Urine: NEGATIVE
Glucose, UA: NEGATIVE mg/dL
Hgb urine dipstick: NEGATIVE
Ketones, ur: 5 mg/dL — AB
Leukocytes,Ua: NEGATIVE
Nitrite: NEGATIVE
Protein, ur: NEGATIVE mg/dL
Specific Gravity, Urine: 1.026 (ref 1.005–1.030)
pH: 5 (ref 5.0–8.0)

## 2024-05-17 LAB — ETHANOL: Alcohol, Ethyl (B): <15 mg/dL

## 2024-05-17 LAB — URINE DRUG SCREEN
Amphetamines: NEGATIVE
Barbiturates: NEGATIVE
Benzodiazepines: POSITIVE — AB
Cocaine: NEGATIVE
Fentanyl: NEGATIVE
Methadone Scn, Ur: NEGATIVE
Opiates: NEGATIVE
Tetrahydrocannabinol: POSITIVE — AB

## 2024-05-17 LAB — CBC
HCT: 41.4 % (ref 36.0–46.0)
Hemoglobin: 13.7 g/dL (ref 12.0–15.0)
MCH: 27.9 pg (ref 26.0–34.0)
MCHC: 33.1 g/dL (ref 30.0–36.0)
MCV: 84.3 fL (ref 80.0–100.0)
Platelets: 251 K/uL (ref 150–400)
RBC: 4.91 MIL/uL (ref 3.87–5.11)
RDW: 12 % (ref 11.5–15.5)
WBC: 7.5 K/uL (ref 4.0–10.5)
nRBC: 0 % (ref 0.0–0.2)

## 2024-05-17 NOTE — ED Notes (Signed)
 Called Lab to add on U-preg to urine sent down

## 2024-05-17 NOTE — ED Provider Notes (Signed)
 "  [facility] Provider Note    Event Date/Time   First MD Initiated Contact with Patient 05/17/24 1402     (approximate)   History   Medical Clearance and Mental Health Problem   HPI  Natalie Duffy is a 43 y.o. female who was sent here from [facility] for medical clearance because of elevated blood pressure while she was there.  She has no complaints and feels well.  She does not want to see psychiatry here.     Physical Exam   Triage Vital Signs: ED Triage Vitals  Encounter Vitals Group     BP 05/17/24 1357 132/85     Girls Systolic BP Percentile --      Girls Diastolic BP Percentile --      Boys Systolic BP Percentile --      Boys Diastolic BP Percentile --      Pulse Rate 05/17/24 1357 95     Resp 05/17/24 1357 17     Temp 05/17/24 1357 98.1 F (36.7 C)     Temp Source 05/17/24 1357 Oral     SpO2 05/17/24 1357 100 %     Weight 05/17/24 1358 59 kg (130 lb)     Height 05/17/24 1358 1.6 m (5' 3)     Head Circumference --      Peak Flow --      Pain Score 05/17/24 1358 0     Pain Loc --      Pain Education --      Exclude from Growth Chart --     Most recent vital signs: Vitals:   05/17/24 1357  BP: 132/85  Pulse: 95  Resp: 17  Temp: 98.1 F (36.7 C)  SpO2: 100%     General: Awake, no distress.  CV:  Good peripheral perfusion.  Resp:  Normal effort.  Abd:  No distention.  Other:     ED Results / Procedures / Treatments   Labs (all labs ordered are listed, but only abnormal results are displayed) Labs Reviewed  COMPREHENSIVE METABOLIC PANEL WITH GFR - Abnormal; Notable for the following components:      Result Value   Glucose, Bld 143 (*)    All other components within normal limits  URINALYSIS, ROUTINE W REFLEX MICROSCOPIC - Abnormal; Notable for the following components:   Color, Urine YELLOW (*)    APPearance HAZY (*)    Ketones, ur 5 (*)    All other components within normal limits  URINE DRUG SCREEN - Abnormal;  Notable for the following components:   Benzodiazepines POSITIVE (*)    Tetrahydrocannabinol POSITIVE (*)    All other components within normal limits  CBC  ETHANOL     EKG  ED ECG REPORT I, Lamar Price, the attending physician, personally viewed and interpreted this ECG.  Date: 05/17/2024  Rhythm: normal sinus rhythm QRS Axis: normal Intervals: normal ST/T Wave abnormalities: normal Narrative Interpretation: no evidence of acute ischemia    RADIOLOGY     PROCEDURES:  Critical Care performed:   Procedures   MEDICATIONS ORDERED IN ED: Medications - No data to display   IMPRESSION / MDM / ASSESSMENT AND PLAN / ED COURSE  I reviewed the triage vital signs and the nursing notes. Patient's presentation is most consistent with acute complicated illness / injury requiring diagnostic workup.  Patient well-appearing and in no acute distress, blood pressure is reassuring here, vitals normal.  EKG labs unremarkable.  She is medically cleared for psychiatric  evaluation.  No indication for IVC or emergent psychiatric evaluation.  Denies SI or HI.  No psychosis        FINAL CLINICAL IMPRESSION(S) / ED DIAGNOSES   Final diagnoses:  Encounter for medical screening examination     Rx / DC Orders   ED Discharge Orders     None        Note:  This document was prepared using Dragon voice recognition software and may include unintentional dictation errors.   Arlander Charleston, MD 05/17/24 1525  "

## 2024-05-17 NOTE — ED Notes (Addendum)
 Patient denies any FLU or COVID symptoms at this time. No Covid/flu/RSV swab ordered.

## 2024-05-17 NOTE — ED Triage Notes (Addendum)
 Patient was just at [facility] for mental health evaluation. Patient is voluntary at this time. Pt states that [facility] stated she needed to be medically cleared before mental health evaluation admission with [facility]. She states that at [facility] her BP was 147/105. BP is currently 132/85. Patient endorses SI but states she does not have a plan at this moment.
